# Patient Record
Sex: Female | Born: 1940 | Race: White | Hispanic: No | Smoking: Never smoker
Health system: Southern US, Community
[De-identification: ages and names within clinical notes are randomized; demographics above are authoritative.]

## PROBLEM LIST (undated history)

## (undated) DIAGNOSIS — M1990 Unspecified osteoarthritis, unspecified site: Secondary | ICD-10-CM

## (undated) DIAGNOSIS — R011 Cardiac murmur, unspecified: Secondary | ICD-10-CM

## (undated) DIAGNOSIS — Z9221 Personal history of antineoplastic chemotherapy: Secondary | ICD-10-CM

## (undated) DIAGNOSIS — M329 Systemic lupus erythematosus, unspecified: Secondary | ICD-10-CM

## (undated) DIAGNOSIS — IMO0002 Reserved for concepts with insufficient information to code with codable children: Secondary | ICD-10-CM

## (undated) DIAGNOSIS — T7840XA Allergy, unspecified, initial encounter: Secondary | ICD-10-CM

## (undated) DIAGNOSIS — C50919 Malignant neoplasm of unspecified site of unspecified female breast: Secondary | ICD-10-CM

## (undated) DIAGNOSIS — R251 Tremor, unspecified: Secondary | ICD-10-CM

## (undated) DIAGNOSIS — Z923 Personal history of irradiation: Secondary | ICD-10-CM

## (undated) HISTORY — DX: Systemic lupus erythematosus, unspecified: M32.9

## (undated) HISTORY — PX: BREAST BIOPSY: SHX20

## (undated) HISTORY — DX: Unspecified osteoarthritis, unspecified site: M19.90

## (undated) HISTORY — DX: Malignant neoplasm of unspecified site of unspecified female breast: C50.919

## (undated) HISTORY — PX: OTHER SURGICAL HISTORY: SHX169

## (undated) HISTORY — DX: Cardiac murmur, unspecified: R01.1

## (undated) HISTORY — DX: Tremor, unspecified: R25.1

## (undated) HISTORY — DX: Reserved for concepts with insufficient information to code with codable children: IMO0002

## (undated) HISTORY — DX: Allergy, unspecified, initial encounter: T78.40XA

---

## 2008-11-19 HISTORY — PX: FOOT SURGERY: SHX648

## 2013-11-19 HISTORY — PX: BREAST LUMPECTOMY: SHX2

## 2013-11-19 HISTORY — PX: REDUCTION MAMMAPLASTY: SUR839

## 2015-12-07 DIAGNOSIS — M79671 Pain in right foot: Secondary | ICD-10-CM | POA: Diagnosis not present

## 2015-12-07 DIAGNOSIS — B070 Plantar wart: Secondary | ICD-10-CM | POA: Diagnosis not present

## 2015-12-07 DIAGNOSIS — M1990 Unspecified osteoarthritis, unspecified site: Secondary | ICD-10-CM | POA: Diagnosis not present

## 2015-12-13 DIAGNOSIS — Z1239 Encounter for other screening for malignant neoplasm of breast: Secondary | ICD-10-CM | POA: Diagnosis not present

## 2015-12-13 DIAGNOSIS — C50911 Malignant neoplasm of unspecified site of right female breast: Secondary | ICD-10-CM | POA: Diagnosis not present

## 2015-12-22 DIAGNOSIS — L659 Nonscarring hair loss, unspecified: Secondary | ICD-10-CM | POA: Diagnosis not present

## 2015-12-22 DIAGNOSIS — R5383 Other fatigue: Secondary | ICD-10-CM | POA: Diagnosis not present

## 2015-12-22 DIAGNOSIS — Z853 Personal history of malignant neoplasm of breast: Secondary | ICD-10-CM | POA: Diagnosis not present

## 2015-12-28 DIAGNOSIS — R05 Cough: Secondary | ICD-10-CM | POA: Diagnosis not present

## 2015-12-28 DIAGNOSIS — R0600 Dyspnea, unspecified: Secondary | ICD-10-CM | POA: Diagnosis not present

## 2015-12-28 DIAGNOSIS — J45909 Unspecified asthma, uncomplicated: Secondary | ICD-10-CM | POA: Diagnosis not present

## 2015-12-30 DIAGNOSIS — R05 Cough: Secondary | ICD-10-CM | POA: Diagnosis not present

## 2016-01-04 DIAGNOSIS — M79671 Pain in right foot: Secondary | ICD-10-CM | POA: Diagnosis not present

## 2016-01-04 DIAGNOSIS — B070 Plantar wart: Secondary | ICD-10-CM | POA: Diagnosis not present

## 2016-01-06 DIAGNOSIS — R0609 Other forms of dyspnea: Secondary | ICD-10-CM | POA: Diagnosis not present

## 2016-01-06 DIAGNOSIS — J45909 Unspecified asthma, uncomplicated: Secondary | ICD-10-CM | POA: Diagnosis not present

## 2016-01-06 DIAGNOSIS — R938 Abnormal findings on diagnostic imaging of other specified body structures: Secondary | ICD-10-CM | POA: Diagnosis not present

## 2016-01-16 DIAGNOSIS — Z08 Encounter for follow-up examination after completed treatment for malignant neoplasm: Secondary | ICD-10-CM | POA: Diagnosis not present

## 2016-01-16 DIAGNOSIS — J9811 Atelectasis: Secondary | ICD-10-CM | POA: Diagnosis not present

## 2016-01-16 DIAGNOSIS — Z853 Personal history of malignant neoplasm of breast: Secondary | ICD-10-CM | POA: Diagnosis not present

## 2016-01-18 DIAGNOSIS — R918 Other nonspecific abnormal finding of lung field: Secondary | ICD-10-CM | POA: Diagnosis not present

## 2016-01-18 DIAGNOSIS — J209 Acute bronchitis, unspecified: Secondary | ICD-10-CM | POA: Diagnosis not present

## 2016-01-18 DIAGNOSIS — R231 Pallor: Secondary | ICD-10-CM | POA: Diagnosis not present

## 2016-02-01 DIAGNOSIS — M79671 Pain in right foot: Secondary | ICD-10-CM | POA: Diagnosis not present

## 2016-02-01 DIAGNOSIS — B070 Plantar wart: Secondary | ICD-10-CM | POA: Diagnosis not present

## 2016-02-01 DIAGNOSIS — M79672 Pain in left foot: Secondary | ICD-10-CM | POA: Diagnosis not present

## 2016-02-10 DIAGNOSIS — R002 Palpitations: Secondary | ICD-10-CM | POA: Diagnosis not present

## 2016-02-10 DIAGNOSIS — E669 Obesity, unspecified: Secondary | ICD-10-CM | POA: Diagnosis not present

## 2016-02-10 DIAGNOSIS — I10 Essential (primary) hypertension: Secondary | ICD-10-CM | POA: Diagnosis not present

## 2016-02-10 DIAGNOSIS — R0989 Other specified symptoms and signs involving the circulatory and respiratory systems: Secondary | ICD-10-CM | POA: Diagnosis not present

## 2016-02-11 DIAGNOSIS — I10 Essential (primary) hypertension: Secondary | ICD-10-CM | POA: Diagnosis not present

## 2016-02-11 DIAGNOSIS — R002 Palpitations: Secondary | ICD-10-CM | POA: Diagnosis not present

## 2016-03-01 DIAGNOSIS — M79672 Pain in left foot: Secondary | ICD-10-CM | POA: Diagnosis not present

## 2016-03-01 DIAGNOSIS — M79671 Pain in right foot: Secondary | ICD-10-CM | POA: Diagnosis not present

## 2016-03-01 DIAGNOSIS — B070 Plantar wart: Secondary | ICD-10-CM | POA: Diagnosis not present

## 2016-03-01 DIAGNOSIS — M2041 Other hammer toe(s) (acquired), right foot: Secondary | ICD-10-CM | POA: Diagnosis not present

## 2016-03-02 DIAGNOSIS — C50919 Malignant neoplasm of unspecified site of unspecified female breast: Secondary | ICD-10-CM | POA: Diagnosis not present

## 2016-03-26 DIAGNOSIS — R0989 Other specified symptoms and signs involving the circulatory and respiratory systems: Secondary | ICD-10-CM | POA: Diagnosis not present

## 2016-03-28 DIAGNOSIS — I10 Essential (primary) hypertension: Secondary | ICD-10-CM | POA: Diagnosis not present

## 2016-03-28 DIAGNOSIS — E785 Hyperlipidemia, unspecified: Secondary | ICD-10-CM | POA: Diagnosis not present

## 2016-03-28 DIAGNOSIS — R0989 Other specified symptoms and signs involving the circulatory and respiratory systems: Secondary | ICD-10-CM | POA: Diagnosis not present

## 2016-03-28 DIAGNOSIS — R002 Palpitations: Secondary | ICD-10-CM | POA: Diagnosis not present

## 2016-03-29 DIAGNOSIS — M79671 Pain in right foot: Secondary | ICD-10-CM | POA: Diagnosis not present

## 2016-03-29 DIAGNOSIS — B070 Plantar wart: Secondary | ICD-10-CM | POA: Diagnosis not present

## 2016-03-30 DIAGNOSIS — C50919 Malignant neoplasm of unspecified site of unspecified female breast: Secondary | ICD-10-CM | POA: Diagnosis not present

## 2016-04-28 DIAGNOSIS — I639 Cerebral infarction, unspecified: Secondary | ICD-10-CM | POA: Diagnosis not present

## 2016-04-28 DIAGNOSIS — R2990 Unspecified symptoms and signs involving the nervous system: Secondary | ICD-10-CM | POA: Diagnosis not present

## 2016-04-28 DIAGNOSIS — I700 Atherosclerosis of aorta: Secondary | ICD-10-CM | POA: Diagnosis not present

## 2016-04-28 DIAGNOSIS — G43109 Migraine with aura, not intractable, without status migrainosus: Secondary | ICD-10-CM | POA: Diagnosis not present

## 2016-04-28 DIAGNOSIS — Z79899 Other long term (current) drug therapy: Secondary | ICD-10-CM | POA: Diagnosis not present

## 2016-04-28 DIAGNOSIS — E785 Hyperlipidemia, unspecified: Secondary | ICD-10-CM | POA: Diagnosis not present

## 2016-04-28 DIAGNOSIS — I10 Essential (primary) hypertension: Secondary | ICD-10-CM | POA: Diagnosis not present

## 2016-04-28 DIAGNOSIS — R32 Unspecified urinary incontinence: Secondary | ICD-10-CM | POA: Diagnosis not present

## 2016-04-28 DIAGNOSIS — Z853 Personal history of malignant neoplasm of breast: Secondary | ICD-10-CM | POA: Diagnosis not present

## 2016-04-28 DIAGNOSIS — G459 Transient cerebral ischemic attack, unspecified: Secondary | ICD-10-CM | POA: Diagnosis not present

## 2016-04-28 DIAGNOSIS — R200 Anesthesia of skin: Secondary | ICD-10-CM | POA: Diagnosis not present

## 2016-04-28 DIAGNOSIS — R209 Unspecified disturbances of skin sensation: Secondary | ICD-10-CM | POA: Diagnosis not present

## 2016-04-29 DIAGNOSIS — G459 Transient cerebral ischemic attack, unspecified: Secondary | ICD-10-CM | POA: Diagnosis not present

## 2016-04-29 DIAGNOSIS — I639 Cerebral infarction, unspecified: Secondary | ICD-10-CM | POA: Diagnosis not present

## 2016-05-01 DIAGNOSIS — R42 Dizziness and giddiness: Secondary | ICD-10-CM | POA: Diagnosis not present

## 2016-05-01 DIAGNOSIS — Z853 Personal history of malignant neoplasm of breast: Secondary | ICD-10-CM | POA: Diagnosis not present

## 2016-05-01 DIAGNOSIS — I10 Essential (primary) hypertension: Secondary | ICD-10-CM | POA: Diagnosis not present

## 2016-05-01 DIAGNOSIS — R0602 Shortness of breath: Secondary | ICD-10-CM | POA: Diagnosis not present

## 2016-05-01 DIAGNOSIS — C50919 Malignant neoplasm of unspecified site of unspecified female breast: Secondary | ICD-10-CM | POA: Diagnosis not present

## 2016-05-01 DIAGNOSIS — R51 Headache: Secondary | ICD-10-CM | POA: Diagnosis not present

## 2016-05-01 DIAGNOSIS — R079 Chest pain, unspecified: Secondary | ICD-10-CM | POA: Diagnosis not present

## 2016-06-11 DIAGNOSIS — C50919 Malignant neoplasm of unspecified site of unspecified female breast: Secondary | ICD-10-CM | POA: Diagnosis not present

## 2016-08-14 DIAGNOSIS — M779 Enthesopathy, unspecified: Secondary | ICD-10-CM | POA: Diagnosis not present

## 2016-08-14 DIAGNOSIS — Z853 Personal history of malignant neoplasm of breast: Secondary | ICD-10-CM | POA: Diagnosis not present

## 2016-08-14 DIAGNOSIS — D179 Benign lipomatous neoplasm, unspecified: Secondary | ICD-10-CM | POA: Diagnosis not present

## 2016-09-14 DIAGNOSIS — J111 Influenza due to unidentified influenza virus with other respiratory manifestations: Secondary | ICD-10-CM | POA: Diagnosis not present

## 2016-09-26 DIAGNOSIS — S0993XA Unspecified injury of face, initial encounter: Secondary | ICD-10-CM | POA: Diagnosis not present

## 2016-09-26 DIAGNOSIS — S199XXA Unspecified injury of neck, initial encounter: Secondary | ICD-10-CM | POA: Diagnosis not present

## 2016-09-26 DIAGNOSIS — S0081XA Abrasion of other part of head, initial encounter: Secondary | ICD-10-CM | POA: Diagnosis not present

## 2016-09-26 DIAGNOSIS — S0990XA Unspecified injury of head, initial encounter: Secondary | ICD-10-CM | POA: Diagnosis not present

## 2016-09-26 DIAGNOSIS — S022XXA Fracture of nasal bones, initial encounter for closed fracture: Secondary | ICD-10-CM | POA: Diagnosis not present

## 2016-09-26 DIAGNOSIS — S098XXA Other specified injuries of head, initial encounter: Secondary | ICD-10-CM | POA: Diagnosis not present

## 2016-09-26 DIAGNOSIS — R040 Epistaxis: Secondary | ICD-10-CM | POA: Diagnosis not present

## 2016-09-26 DIAGNOSIS — S0090XA Unspecified superficial injury of unspecified part of head, initial encounter: Secondary | ICD-10-CM | POA: Diagnosis not present

## 2016-10-03 DIAGNOSIS — S20211A Contusion of right front wall of thorax, initial encounter: Secondary | ICD-10-CM | POA: Diagnosis not present

## 2016-10-24 DIAGNOSIS — R0781 Pleurodynia: Secondary | ICD-10-CM | POA: Diagnosis not present

## 2016-10-24 DIAGNOSIS — G8929 Other chronic pain: Secondary | ICD-10-CM | POA: Diagnosis not present

## 2016-10-24 DIAGNOSIS — N390 Urinary tract infection, site not specified: Secondary | ICD-10-CM | POA: Diagnosis not present

## 2016-10-24 DIAGNOSIS — R5383 Other fatigue: Secondary | ICD-10-CM | POA: Diagnosis not present

## 2016-10-24 DIAGNOSIS — R3981 Functional urinary incontinence: Secondary | ICD-10-CM | POA: Diagnosis not present

## 2016-10-24 DIAGNOSIS — Z609 Problem related to social environment, unspecified: Secondary | ICD-10-CM | POA: Diagnosis not present

## 2016-10-24 DIAGNOSIS — F321 Major depressive disorder, single episode, moderate: Secondary | ICD-10-CM | POA: Diagnosis not present

## 2016-10-24 DIAGNOSIS — M549 Dorsalgia, unspecified: Secondary | ICD-10-CM | POA: Diagnosis not present

## 2016-10-24 DIAGNOSIS — E669 Obesity, unspecified: Secondary | ICD-10-CM | POA: Diagnosis not present

## 2016-10-25 DIAGNOSIS — S022XXA Fracture of nasal bones, initial encounter for closed fracture: Secondary | ICD-10-CM | POA: Diagnosis not present

## 2016-10-25 DIAGNOSIS — J342 Deviated nasal septum: Secondary | ICD-10-CM | POA: Diagnosis not present

## 2016-10-25 DIAGNOSIS — H903 Sensorineural hearing loss, bilateral: Secondary | ICD-10-CM | POA: Diagnosis not present

## 2016-10-25 DIAGNOSIS — H608X3 Other otitis externa, bilateral: Secondary | ICD-10-CM | POA: Diagnosis not present

## 2016-10-30 DIAGNOSIS — N816 Rectocele: Secondary | ICD-10-CM | POA: Diagnosis not present

## 2016-10-30 DIAGNOSIS — R102 Pelvic and perineal pain: Secondary | ICD-10-CM | POA: Diagnosis not present

## 2016-10-30 DIAGNOSIS — N393 Stress incontinence (female) (male): Secondary | ICD-10-CM | POA: Diagnosis not present

## 2016-10-30 DIAGNOSIS — N8110 Cystocele, unspecified: Secondary | ICD-10-CM | POA: Diagnosis not present

## 2016-10-30 DIAGNOSIS — N942 Vaginismus: Secondary | ICD-10-CM | POA: Diagnosis not present

## 2016-10-30 DIAGNOSIS — R3982 Chronic bladder pain: Secondary | ICD-10-CM | POA: Diagnosis not present

## 2016-10-30 DIAGNOSIS — N952 Postmenopausal atrophic vaginitis: Secondary | ICD-10-CM | POA: Diagnosis not present

## 2016-11-01 DIAGNOSIS — Z0000 Encounter for general adult medical examination without abnormal findings: Secondary | ICD-10-CM | POA: Diagnosis not present

## 2016-11-01 DIAGNOSIS — M1990 Unspecified osteoarthritis, unspecified site: Secondary | ICD-10-CM | POA: Diagnosis not present

## 2016-11-01 DIAGNOSIS — R5383 Other fatigue: Secondary | ICD-10-CM | POA: Diagnosis not present

## 2016-11-01 DIAGNOSIS — M2550 Pain in unspecified joint: Secondary | ICD-10-CM | POA: Diagnosis not present

## 2016-11-01 DIAGNOSIS — Z79899 Other long term (current) drug therapy: Secondary | ICD-10-CM | POA: Diagnosis not present

## 2016-11-02 DIAGNOSIS — R350 Frequency of micturition: Secondary | ICD-10-CM | POA: Diagnosis not present

## 2016-11-02 DIAGNOSIS — K219 Gastro-esophageal reflux disease without esophagitis: Secondary | ICD-10-CM | POA: Diagnosis not present

## 2016-11-02 DIAGNOSIS — N3941 Urge incontinence: Secondary | ICD-10-CM | POA: Diagnosis not present

## 2016-11-02 DIAGNOSIS — N898 Other specified noninflammatory disorders of vagina: Secondary | ICD-10-CM | POA: Diagnosis not present

## 2016-11-05 DIAGNOSIS — R350 Frequency of micturition: Secondary | ICD-10-CM | POA: Diagnosis not present

## 2016-11-05 DIAGNOSIS — R102 Pelvic and perineal pain: Secondary | ICD-10-CM | POA: Diagnosis not present

## 2016-11-06 DIAGNOSIS — R3981 Functional urinary incontinence: Secondary | ICD-10-CM | POA: Diagnosis not present

## 2016-11-06 DIAGNOSIS — R350 Frequency of micturition: Secondary | ICD-10-CM | POA: Diagnosis not present

## 2016-11-06 DIAGNOSIS — N898 Other specified noninflammatory disorders of vagina: Secondary | ICD-10-CM | POA: Diagnosis not present

## 2016-11-06 DIAGNOSIS — E781 Pure hyperglyceridemia: Secondary | ICD-10-CM | POA: Diagnosis not present

## 2016-11-06 DIAGNOSIS — Z853 Personal history of malignant neoplasm of breast: Secondary | ICD-10-CM | POA: Diagnosis not present

## 2016-11-06 DIAGNOSIS — F321 Major depressive disorder, single episode, moderate: Secondary | ICD-10-CM | POA: Diagnosis not present

## 2016-11-08 DIAGNOSIS — N952 Postmenopausal atrophic vaginitis: Secondary | ICD-10-CM | POA: Diagnosis not present

## 2016-11-08 DIAGNOSIS — N8110 Cystocele, unspecified: Secondary | ICD-10-CM | POA: Diagnosis not present

## 2016-11-08 DIAGNOSIS — N393 Stress incontinence (female) (male): Secondary | ICD-10-CM | POA: Diagnosis not present

## 2016-11-08 DIAGNOSIS — R102 Pelvic and perineal pain: Secondary | ICD-10-CM | POA: Diagnosis not present

## 2016-11-08 DIAGNOSIS — N942 Vaginismus: Secondary | ICD-10-CM | POA: Diagnosis not present

## 2016-11-08 DIAGNOSIS — R3982 Chronic bladder pain: Secondary | ICD-10-CM | POA: Diagnosis not present

## 2016-11-08 DIAGNOSIS — N816 Rectocele: Secondary | ICD-10-CM | POA: Diagnosis not present

## 2016-11-14 DIAGNOSIS — N393 Stress incontinence (female) (male): Secondary | ICD-10-CM | POA: Diagnosis not present

## 2016-11-14 DIAGNOSIS — R3915 Urgency of urination: Secondary | ICD-10-CM | POA: Diagnosis not present

## 2016-11-14 DIAGNOSIS — R351 Nocturia: Secondary | ICD-10-CM | POA: Diagnosis not present

## 2016-11-14 DIAGNOSIS — R350 Frequency of micturition: Secondary | ICD-10-CM | POA: Diagnosis not present

## 2016-11-16 DIAGNOSIS — Z01419 Encounter for gynecological examination (general) (routine) without abnormal findings: Secondary | ICD-10-CM | POA: Diagnosis not present

## 2016-11-30 DIAGNOSIS — C50919 Malignant neoplasm of unspecified site of unspecified female breast: Secondary | ICD-10-CM | POA: Diagnosis not present

## 2016-11-30 DIAGNOSIS — E559 Vitamin D deficiency, unspecified: Secondary | ICD-10-CM | POA: Diagnosis not present

## 2016-12-05 DIAGNOSIS — E871 Hypo-osmolality and hyponatremia: Secondary | ICD-10-CM | POA: Diagnosis not present

## 2016-12-05 DIAGNOSIS — R945 Abnormal results of liver function studies: Secondary | ICD-10-CM | POA: Diagnosis not present

## 2016-12-11 DIAGNOSIS — M3500 Sicca syndrome, unspecified: Secondary | ICD-10-CM | POA: Diagnosis not present

## 2016-12-11 DIAGNOSIS — L659 Nonscarring hair loss, unspecified: Secondary | ICD-10-CM | POA: Diagnosis not present

## 2016-12-14 DIAGNOSIS — R928 Other abnormal and inconclusive findings on diagnostic imaging of breast: Secondary | ICD-10-CM | POA: Diagnosis not present

## 2016-12-17 DIAGNOSIS — R0981 Nasal congestion: Secondary | ICD-10-CM | POA: Diagnosis not present

## 2016-12-17 DIAGNOSIS — J310 Chronic rhinitis: Secondary | ICD-10-CM | POA: Diagnosis not present

## 2016-12-17 DIAGNOSIS — J342 Deviated nasal septum: Secondary | ICD-10-CM | POA: Diagnosis not present

## 2017-01-24 DIAGNOSIS — L603 Nail dystrophy: Secondary | ICD-10-CM | POA: Diagnosis not present

## 2017-01-24 DIAGNOSIS — L84 Corns and callosities: Secondary | ICD-10-CM | POA: Diagnosis not present

## 2017-01-24 DIAGNOSIS — M25571 Pain in right ankle and joints of right foot: Secondary | ICD-10-CM | POA: Diagnosis not present

## 2017-01-24 DIAGNOSIS — I739 Peripheral vascular disease, unspecified: Secondary | ICD-10-CM | POA: Diagnosis not present

## 2017-03-28 ENCOUNTER — Encounter: Payer: Self-pay | Admitting: Family Medicine

## 2017-03-28 ENCOUNTER — Other Ambulatory Visit: Payer: Self-pay | Admitting: Family Medicine

## 2017-03-28 ENCOUNTER — Ambulatory Visit (INDEPENDENT_AMBULATORY_CARE_PROVIDER_SITE_OTHER): Payer: Medicare Other | Admitting: Family Medicine

## 2017-03-28 VITALS — BP 140/82 | HR 79 | Temp 98.4°F | Resp 16 | Ht 66.0 in | Wt 206.0 lb

## 2017-03-28 DIAGNOSIS — R32 Unspecified urinary incontinence: Secondary | ICD-10-CM | POA: Diagnosis not present

## 2017-03-28 DIAGNOSIS — R5383 Other fatigue: Secondary | ICD-10-CM | POA: Diagnosis not present

## 2017-03-28 DIAGNOSIS — C50919 Malignant neoplasm of unspecified site of unspecified female breast: Secondary | ICD-10-CM

## 2017-03-28 DIAGNOSIS — C801 Malignant (primary) neoplasm, unspecified: Secondary | ICD-10-CM | POA: Diagnosis not present

## 2017-03-28 DIAGNOSIS — M329 Systemic lupus erythematosus, unspecified: Secondary | ICD-10-CM

## 2017-03-28 DIAGNOSIS — C50911 Malignant neoplasm of unspecified site of right female breast: Secondary | ICD-10-CM | POA: Insufficient documentation

## 2017-03-28 LAB — POCT URINALYSIS DIP (DEVICE)
Bilirubin Urine: NEGATIVE
GLUCOSE, UA: NEGATIVE mg/dL
Hgb urine dipstick: NEGATIVE
Ketones, ur: NEGATIVE mg/dL
Leukocytes, UA: NEGATIVE
Nitrite: NEGATIVE
PH: 5.5 (ref 5.0–8.0)
PROTEIN: NEGATIVE mg/dL
SPECIFIC GRAVITY, URINE: 1.015 (ref 1.005–1.030)
UROBILINOGEN UA: 0.2 mg/dL (ref 0.0–1.0)

## 2017-03-28 NOTE — Progress Notes (Addendum)
Patient ID: Sarah Mata, female    DOB: 1941-11-15, 76 y.o.   MRN: [id number]  PCP: Scot Jun, FNP  Chief Complaint  Patient presents with  . Establish Care  . Fatigue    Subjective:  HPI Sarah Mata is a 76 y.o. female presents to establish care today. Sarah Mata recently relocated to [facility] from [place].  Medical problems include hx of right breast cancer, lumpectomy (2015), Systemic Lupus Erythematosus,  Obesity (33.3 BMI). Sarah Mata reports that she doesn't care for taking prescription medications.  She takes vitamins supplements which are all listed on her medication list.   Breast Cancer Previously followed by [facility] in [place], [place] for oncology treatments. She is currently prescribed Letrozole 2.5 mg. Reports that she was told that the cancer was in remission and treatment now consists of medication and routine oncology surveillance.  Systemic Lupus Erythematosus  Diagnosed several years ago and at some point was followed by Rheumatology.  She didn't care for her specialist and stopped taking her prescribed Plaquenil. She thinks it has been at least  3 or more years since she stopped taking plaquenil. She feels for the most part her Lupus has remained under control. No prior hospitalization related to SLE. Denies any past problems with kidneys and or kidney functions. Reports intermittent although on rare occasions  She experiences joint pain and "flu-like" feeling that causes her to want to ly around in the bed as her energy is drained.   Bladder Leakage  Prior to leaving [place], Sarah Mata was being evaluated for pelvic floor weakness by gynecology. GYN wanted to start her on medication to try and relax her bladder although patient declined. Problem is worsening and she reports increase urinary frequency and occasional incontinence.   Fatigue  Thinks she is very overweight which may be contributing to her fatigue.  Somewhat fearful that SLE is  worsening as she hasn't taken her medication in so long. Quick to tire after walking around the store and or completing minimal tasks. Presently is she not engaged in any physical exercise.   Social History   Social History  . Marital status: Widowed    Spouse name: N/A  . Number of children: N/A  . Years of education: N/A   Occupational History  . Not on file.   Social History Main Topics  . Smoking status: Never Smoker  . Smokeless tobacco: Never Used  . Alcohol use No  . Drug use: No  . Sexual activity: Not on file   Other Topics Concern  . Not on file   Social History Narrative  . No narrative on file   Review of Systems See HPI   Prior to Admission medications   Medication Sig Start Date End Date Taking? Authorizing Provider  Biotin 10000 MCG TABS Take by mouth.   Yes [provider]  Cholecalciferol (VITAMIN D3) 3000 units TABS Take by mouth.   Yes [provider]  Coenzyme Q10 (CO Q-10) 100 MG CAPS Take by mouth.   Yes [provider]  letrozole (FEMARA) 2.5 MG tablet Take 2.5 mg by mouth daily.   Yes [provider]  vitamin E 400 UNIT capsule Take 400 Units by mouth daily.   Yes [provider]   Past Medical, Surgical Family and Social History reviewed and updated.  Objective:   Today's Vitals   03/28/17 1408  BP: 140/82  Pulse: 79  Resp: 16  Temp: 98.4 F (36.9 C)  TempSrc: Oral  SpO2:  95%  Weight: 206 lb (93.4 kg)  Height: 5\' 6"  (1.676 m)    Wt Readings from Last 3 Encounters:  03/28/17 206 lb (93.4 kg)   Physical Exam  Constitutional: She is oriented to person, place, and time. She appears well-developed and well-nourished.  HENT:  Head: Normocephalic and atraumatic.  Right Ear: External ear normal.  Left Ear: External ear normal.  Nose: Nose normal.  Mouth/Throat: Oropharynx is clear and moist.  Eyes: Conjunctivae and EOM are normal. Pupils are equal, round, and reactive to light.  Neck:  Normal range of motion. Neck supple. No thyromegaly present.  Cardiovascular: Normal rate, regular rhythm, normal heart sounds and intact distal pulses.   Pulmonary/Chest: Effort normal and breath sounds normal.  Abdominal: Soft. Bowel sounds are normal.  Musculoskeletal: Normal range of motion.  Neurological: She is alert and oriented to person, place, and time.  Skin: Skin is warm and dry.  Psychiatric: She has a normal mood and affect. Her behavior is normal. Judgment and thought content normal.      Assessment & Plan:  1. Malignant neoplasm of female breast, unspecified estrogen receptor status, unspecified laterality, unspecified site of breast ([place]) - COMPLETE METABOLIC PANEL WITH GFR - CBC with Differential - Ambulatory referral to Oncology -Medical release form received from patient and will initiate obtaining medical records from [facility].  2. Systemic lupus erythematosus, unspecified SLE type, unspecified organ involvement status (HCC) - ANA, IFA Comprehensive Panel - Sedimentation Rate - COMPLETE METABOLIC PANEL WITH GFR - C-reactive protein - Ambulatory referral to Rheumatology  3. Fatigue, unspecified type - Thyroid Panel With TSH  4. Bladder leak - Ambulatory referral to Urology  RTC: 6 weeks evaluation of fatigue and review labs results    Carroll Sage. Kenton Kingfisher, MSN, [facility] [address] [place], [place] [address] [phone]

## 2017-03-28 NOTE — Patient Instructions (Addendum)
I have referred you to Oncology for routine oncology follow-up due to history of breast cancer.  I will update you, regarding your lab results and decide whether or not you require a referral to rheumatology at this point.  Return for follow-up in 6 weeks to re-evaluate your fatigue. You may take Fish Oil 1,000 mg for cardiovascular health promotion.  I am also referring you to urology for evaluation and treatment of pelvic floor weakness and incontinence.   Referrals will contact you to schedule appointments   Fatigue Fatigue is feeling tired all of the time, a lack of energy, or a lack of motivation. Occasional or mild fatigue is often a normal response to activity or life in general. However, long-lasting (chronic) or extreme fatigue may indicate an underlying medical condition. Follow these instructions at home: Watch your fatigue for any changes. The following actions may help to lessen any discomfort you are feeling:  Talk to your health care provider about how much sleep you need each night. Try to get the required amount every night.  Take medicines only as directed by your health care provider.  Eat a healthy and nutritious diet. Ask your health care provider if you need help changing your diet.  Drink enough fluid to keep your urine clear or pale yellow.  Practice ways of relaxing, such as yoga, meditation, massage therapy, or acupuncture.  Exercise regularly.  Change situations that cause you stress. Try to keep your work and personal routine reasonable.  Do not abuse illegal drugs.  Limit alcohol intake to no more than 1 drink per day for nonpregnant women and 2 drinks per day for men. One drink equals 12 ounces of beer, 5 ounces of wine, or 1 ounces of hard liquor.  Take a multivitamin, if directed by your health care provider. Contact a health care provider if:  Your fatigue does not get better.  You have a fever.  You have unintentional weight loss or  gain.  You have headaches.  You have difficulty:  Falling asleep.  Sleeping throughout the night.  You feel angry, guilty, anxious, or sad.  You are unable to have a bowel movement (constipation).  You skin is dry.  Your legs or another part of your body is swollen. Get help right away if:  You feel confused.  Your vision is blurry.  You feel faint or pass out.  You have a severe headache.  You have severe abdominal, pelvic, or back pain.  You have chest pain, shortness of breath, or an irregular or fast heartbeat.  You are unable to urinate or you urinate less than normal.  You develop abnormal bleeding, such as bleeding from the rectum, vagina, nose, lungs, or nipples.  You vomit blood.  You have thoughts about harming yourself or committing suicide.  You are worried that you might harm someone else. This information is not intended to replace advice given to you by your health care provider. Make sure you discuss any questions you have with your health care provider. Document Released: 09/02/2007 Document Revised: 04/12/2016 Document Reviewed: 03/09/2014 Elsevier Interactive Patient Education  2017 [employer].

## 2017-03-29 ENCOUNTER — Telehealth: Payer: Self-pay

## 2017-03-29 LAB — ANA, IFA COMPREHENSIVE PANEL
Anti Nuclear Antibody(ANA): POSITIVE — AB
ENA SM AB SER-ACNC: NEGATIVE
SCLERODERMA (SCL-70) (ENA) ANTIBODY, IGG: NEGATIVE
SM/RNP: POSITIVE — AB
SSA (Ro) (ENA) Antibody, IgG: 4.9 — ABNORMAL HIGH
SSB (La) (ENA) Antibody, IgG: <1
ds DNA Ab: 1 IU/mL

## 2017-03-29 LAB — COMPLETE METABOLIC PANEL WITH GFR
ALT: 12 U/L (ref 6–29)
AST: 21 U/L (ref 10–35)
Albumin: 4.2 g/dL (ref 3.6–5.1)
Alkaline Phosphatase: 112 U/L (ref 33–130)
BUN: 16 mg/dL (ref 7–25)
CALCIUM: 9.4 mg/dL (ref 8.6–10.4)
CHLORIDE: 103 mmol/L (ref 98–110)
CO2: 17 mmol/L — ABNORMAL LOW (ref 20–31)
CREATININE: 0.83 mg/dL (ref 0.60–0.93)
GFR, Est African American: 81 mL/min (ref >=60)
GFR, Est Non African American: 70 mL/min (ref >=60)
GLUCOSE: 78 mg/dL (ref 65–99)
POTASSIUM: 4.2 mmol/L (ref 3.5–5.3)
SODIUM: 137 mmol/L (ref 135–146)
Total Bilirubin: 0.4 mg/dL (ref 0.2–1.2)
Total Protein: 7.1 g/dL (ref 6.1–8.1)

## 2017-03-29 LAB — THYROID PANEL WITH TSH
Free Thyroxine Index: 2.4 (ref 1.4–3.8)
T3 Uptake: 28 % (ref 22–35)
T4, Total: 8.6 ug/dL (ref 4.5–12.0)
TSH: 2.01 m[IU]/L

## 2017-03-29 LAB — CBC WITH DIFFERENTIAL/PLATELET
BASOS PCT: 1 %
Basophils Absolute: 73 cells/uL (ref 0–200)
EOS ABS: 146 {cells}/uL (ref 15–500)
Eosinophils Relative: 2 %
HCT: 42.8 % (ref 35.0–45.0)
HEMOGLOBIN: 14.2 g/dL (ref 11.7–15.5)
LYMPHS ABS: 1752 {cells}/uL (ref 850–3900)
Lymphocytes Relative: 24 %
MCH: 30 pg (ref 27.0–33.0)
MCHC: 33.2 g/dL (ref 32.0–36.0)
MCV: 90.3 fL (ref 80.0–100.0)
MONO ABS: 876 {cells}/uL (ref 200–950)
MPV: 11.1 fL (ref 7.5–12.5)
Monocytes Relative: 12 %
NEUTROS PCT: 61 %
Neutro Abs: 4453 cells/uL (ref 1500–7800)
Platelets: 189 10*3/uL (ref 140–400)
RBC: 4.74 MIL/uL (ref 3.80–5.10)
RDW: 14.6 % (ref 11.0–15.0)
WBC: 7.3 10*3/uL (ref 3.8–10.8)

## 2017-03-29 LAB — C-REACTIVE PROTEIN: CRP: 5.1 mg/L (ref <8.0)

## 2017-03-29 LAB — HEMOGLOBIN A1C

## 2017-03-29 LAB — ANTI-NUCLEAR AB-TITER (ANA TITER)

## 2017-03-29 LAB — SEDIMENTATION RATE

## 2017-03-29 NOTE — Telephone Encounter (Signed)
Per Lelon Frohlich at [facility] A1C was added on.

## 2017-03-29 NOTE — Telephone Encounter (Signed)
-----   Message from Scot Jun, [place] sent at 03/29/2017  9:05 AM EDT ----- Regarding: Add Lab Please add hemoglobin A1C

## 2017-04-03 ENCOUNTER — Telehealth: Payer: Self-pay | Admitting: Family Medicine

## 2017-04-03 NOTE — Telephone Encounter (Signed)
erroneous

## 2017-04-03 NOTE — Telephone Encounter (Signed)
Sarah Mata,  Please call, Dr. Prentice Docker office to request medical records for [facility]  [phone]. I will provide you with the signed medical release form to fax over. I need all records pertaining to her diagnosis and treatment of  Malignant breast cancer. This information is needed to establish care with [facility].  Carroll Sage. Kenton Kingfisher, MSN, FNP-C [facility]  [address] [place], [place] [address] [phone]

## 2017-04-04 ENCOUNTER — Telehealth: Payer: Self-pay | Admitting: Family Medicine

## 2017-04-04 NOTE — Progress Notes (Signed)
Hemoglobin pending for next visit. Prior visit there was not sufficient blood to run test.

## 2017-04-04 NOTE — Telephone Encounter (Signed)
Called patient to advise of lab results and pending referral to Rheumatology and Oncology. She provided me with a different contact number for prior oncologist in [place]. Reports she has no prior contact information for Rheumatologist as it has been greater than 8 years since she followed a Rheumatologist.  Patient advised me that she recently stopped taking her letrozole as she feels it is poison that she is putting in her body. I explained that this medication is indicated In the treatment of her diagnosis of breast cancer and I strongly recommend continuing regimen until she is evaluated by oncology.  She verbalized understanding however she reiterated that she is discontinuing medication.  Carroll Sage. Kenton Kingfisher, MSN, FNP-C [facility]  [address] [place] [place], [place] [address] [phone]

## 2017-04-07 ENCOUNTER — Telehealth: Payer: Self-pay | Admitting: Family Medicine

## 2017-04-07 NOTE — Telephone Encounter (Signed)
Andria Frames please contact Dr. Burnis Kingfisher office at [phone] in [place] to obtain fax number to request patient's medical records.

## 2017-04-09 ENCOUNTER — Telehealth: Payer: Self-pay

## 2017-04-09 NOTE — Telephone Encounter (Signed)
Advise patient that I can't refill her Femara as this is a medication use to treat her  Breast cancer. Her referral to oncology is pending receipt of medical records.

## 2017-04-12 ENCOUNTER — Telehealth: Payer: Self-pay | Admitting: Family Medicine

## 2017-04-12 NOTE — Telephone Encounter (Signed)
Pt requests completion of form for handicap placard; pt informed to call back on Tuesday to check for completion

## 2017-04-16 NOTE — Telephone Encounter (Signed)
Left vm for patient to call back.

## 2017-04-16 NOTE — Telephone Encounter (Signed)
Patient notified that form is ready for pick up.

## 2017-04-18 ENCOUNTER — Encounter: Payer: Self-pay | Admitting: Hematology

## 2017-04-18 ENCOUNTER — Telehealth: Payer: Self-pay | Admitting: Hematology

## 2017-04-18 NOTE — Telephone Encounter (Signed)
Appt has been scheduled for the pt to see Dr. Burr Medico on 7/3 at 230pm. Pt aware to arrive 30 minutes early. Demographics verified. Letter mailed.

## 2017-05-07 ENCOUNTER — Ambulatory Visit: Payer: 59 | Admitting: Family Medicine

## 2017-05-09 ENCOUNTER — Ambulatory Visit: Payer: 59 | Admitting: Family Medicine

## 2017-05-21 ENCOUNTER — Telehealth: Payer: Self-pay | Admitting: Hematology

## 2017-05-21 ENCOUNTER — Encounter: Payer: Self-pay | Admitting: Hematology

## 2017-05-21 ENCOUNTER — Ambulatory Visit (HOSPITAL_BASED_OUTPATIENT_CLINIC_OR_DEPARTMENT_OTHER): Payer: Medicare Other | Admitting: Hematology

## 2017-05-21 ENCOUNTER — Ambulatory Visit (HOSPITAL_BASED_OUTPATIENT_CLINIC_OR_DEPARTMENT_OTHER): Payer: Medicare Other

## 2017-05-21 VITALS — BP 150/78 | HR 71 | Temp 98.5°F | Resp 20 | Ht 66.0 in | Wt 205.2 lb

## 2017-05-21 DIAGNOSIS — E2839 Other primary ovarian failure: Secondary | ICD-10-CM

## 2017-05-21 DIAGNOSIS — Z170 Estrogen receptor positive status [ER+]: Secondary | ICD-10-CM

## 2017-05-21 DIAGNOSIS — Z79811 Long term (current) use of aromatase inhibitors: Secondary | ICD-10-CM | POA: Diagnosis not present

## 2017-05-21 DIAGNOSIS — E559 Vitamin D deficiency, unspecified: Secondary | ICD-10-CM

## 2017-05-21 DIAGNOSIS — C50911 Malignant neoplasm of unspecified site of right female breast: Secondary | ICD-10-CM | POA: Diagnosis not present

## 2017-05-21 LAB — COMPREHENSIVE METABOLIC PANEL
ALK PHOS: 149 U/L (ref 40–150)
ALT: 338 U/L (ref 0–55)
AST: 159 U/L — ABNORMAL HIGH (ref 5–34)
Albumin: 3.8 g/dL (ref 3.5–5.0)
Anion Gap: 10 mEq/L (ref 3–11)
BILIRUBIN TOTAL: 0.57 mg/dL (ref 0.20–1.20)
BUN: 16.9 mg/dL (ref 7.0–26.0)
CO2: 29 meq/L (ref 22–29)
Calcium: 10.1 mg/dL (ref 8.4–10.4)
Chloride: 100 mEq/L (ref 98–109)
Creatinine: 0.9 mg/dL (ref 0.6–1.1)
EGFR: 63 mL/min/{1.73_m2} — AB (ref >90)
GLUCOSE: 77 mg/dL (ref 70–140)
POTASSIUM: 4.5 meq/L (ref 3.5–5.1)
SODIUM: 138 meq/L (ref 136–145)
TOTAL PROTEIN: 7.6 g/dL (ref 6.4–8.3)

## 2017-05-21 LAB — CBC WITH DIFFERENTIAL/PLATELET
BASO%: 1.1 % (ref 0.0–2.0)
BASOS ABS: 0.1 10*3/uL (ref 0.0–0.1)
EOS ABS: 0.1 10*3/uL (ref 0.0–0.5)
EOS%: 2.2 % (ref 0.0–7.0)
HCT: 42.2 % (ref 34.8–46.6)
HGB: 13.9 g/dL (ref 11.6–15.9)
LYMPH%: 24 % (ref 14.0–49.7)
MCH: 29.2 pg (ref 25.1–34.0)
MCHC: 33 g/dL (ref 31.5–36.0)
MCV: 88.4 fL (ref 79.5–101.0)
MONO#: 0.8 10*3/uL (ref 0.1–0.9)
MONO%: 14.1 % — AB (ref 0.0–14.0)
NEUT#: 3.1 10*3/uL (ref 1.5–6.5)
NEUT%: 58.6 % (ref 38.4–76.8)
PLATELETS: 182 10*3/uL (ref 145–400)
RBC: 4.77 10*6/uL (ref 3.70–5.45)
RDW: 14.2 % (ref 11.2–14.5)
WBC: 5.4 10*3/uL (ref 3.9–10.3)
lymph#: 1.3 10*3/uL (ref 0.9–3.3)

## 2017-05-21 MED ORDER — ANASTROZOLE 1 MG PO TABS: 1.0000 mg | ORAL_TABLET | Freq: Every day | ORAL | 0 refills | Status: DC

## 2017-05-21 NOTE — Telephone Encounter (Signed)
Scheduled appt per 7/23 los - Gave patient AVS and calender per los.  

## 2017-05-21 NOTE — Progress Notes (Addendum)
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 Grade 3, Ki-67 20%.      03/2014 Miscellaneous    mammaprint reviewed high-risk luminal type      04/2014 -  Radiation Therapy    Patient underwent radiation to the right chest wall      08/2014 -  Anti-estrogen oral therapy    Started Femara 2.'5mg'$         HISTORY OF PRESENTING ILLNESS:  Sarah Mata is a  76 y.o. female who has recently relocated from [place] to [place]. She has a known diagnosis of right-sided breast cancer. She presented with a palpable abnormality in the right breast which led to additional workup in March 2015. A core biopsy in April of 2015 showed an invasive ductal carcinoma grade 2, ER+/PR-/HER-2 negaive. She later underwent a right lumpectomy with axillary lymph node sampling. The tumor characteristics notes a 2.5 x 2 x 2 cm tumor with evidence of lymphovascular invasion. The tumor was high-rade at grade 3 with Kli 67 index at 20%. She underwent 4 cycles of cytoxan and Taxotere with growth factor support with Neupogen which she finished in October of 2015. Post treatment, she underwent radiation therapy to the right chest wall and also was  started on antiestrogen therapy with Letrozole 2.5 mg/day that she is complaint with.  She also suffers from history of anxiety.    Today, she presents to the clinic after moving to [place]. Besides hair loss and fatigue, she has no other problems with Letrozole. She has Lupus which she says might also cause her fatigue. Before moving to [place], she fell and broke her nose. She denies any other chronic conditions.  MEDICAL HISTORY:  Past Medical History:  Diagnosis Date  . Breast cancer ([place])   . Lupus     SURGICAL HISTORY: Past Surgical History:  Procedure Laterality Date  . BREAST LUMPECTOMY Right 2015  . FOOT SURGERY Right 2010    SOCIAL HISTORY: Social History   Social History  . Marital status: Widowed    Spouse name: N/A  . Number of children: N/A  . Years of education: N/A   Occupational History  . Not on file.   Social History Main Topics  . Smoking status: Never Smoker  . Smokeless tobacco: Never Used  . Alcohol use No  . Drug use: No  . Sexual activity: Not on file   Other Topics Concern  . Not on file   Social History Narrative  . No narrative on file    FAMILY HISTORY: Family History  Problem Relation Age of Onset  . Cancer Mother  breast cancer   . Cancer Sister 74       breast cancer     ALLERGIES:  is allergic to tetracyclines & related.  MEDICATIONS:  Current Outpatient Prescriptions  Medication Sig Dispense Refill  . Biotin 10000 MCG TABS Take by mouth.    . Cholecalciferol (VITAMIN D3) 3000 units TABS Take by mouth.    . Coenzyme Q10 (CO Q-10) 100 MG CAPS Take by mouth.    . letrozole (FEMARA) 2.5 MG tablet Take 2.5 mg by mouth daily.    Marland Kitchen anastrozole (ARIMIDEX) 1 MG tablet Take 1 tablet (1 mg total) by mouth daily. 30 tablet 0  . azelastine (ASTELIN) 0.1 % nasal spray     . fluticasone (FLONASE) 50 MCG/ACT nasal spray     . vitamin E 400 UNIT capsule Take 400 Units by mouth daily.     No current facility-administered medications for  this visit.     REVIEW OF SYSTEMS:   Constitutional: Denies fevers, chills or abnormal night sweats Eyes: Denies blurriness of vision, double vision or watery eyes Ears, nose, mouth, throat, and face: Denies mucositis or sore throat Respiratory: Denies cough, dyspnea or wheezes Cardiovascular: Denies palpitation, chest discomfort or lower extremity swelling Gastrointestinal:  Denies nausea, heartburn or change in bowel habits Skin: Denies abnormal skin rashes Lymphatics: Denies new lymphadenopathy or easy bruising Neurological:Denies numbness, tingling or new weaknesses (+)shaking of right hand Behavioral/Psych: Mood is stable, no new changes  All other systems were reviewed with the patient and are negative.  PHYSICAL EXAMINATION:  ECOG PERFORMANCE STATUS: 0 - Asymptomatic  Vitals:   05/21/17 1449  BP: (!) 150/78  Pulse: 71  Resp: 20  Temp: 98.5 F (36.9 C)   Filed Weights   05/21/17 1449  Weight: 205 lb 3.2 oz (93.1 kg)    GENERAL:alert, no distress and comfortable SKIN: skin color, texture, turgor are normal, no rashes or significant lesions EYES: normal, conjunctiva are pink and non-injected, sclera clear OROPHARYNX:no exudate, no erythema and lips, buccal mucosa, and tongue normal  NECK: supple, thyroid normal size, non-tender, without nodularity LYMPH:  no palpable lymphadenopathy in the cervical, axillary or inguinal LUNGS: clear to auscultation and percussion with normal breathing effort HEART: regular rate & rhythm and no murmurs and no lower extremity edema ABDOMEN:abdomen soft, non-tender and normal bowel sounds Musculoskeletal:no cyanosis of digits and no clubbing  PSYCH: alert & oriented x 3 with fluent speech NEURO: no focal motor/sensory deficits Breasts: Breast inspection showed them to be symmetrical with no nipple discharge, s/p . Surgical scar in the right breast and axilla has healed well. Palpation of the breasts and axilla revealed no obvious mass  that I could appreciate, except surgical scar and a 1cm mass above the surgical scar of right breast, at 7:00 position    LABORATORY DATA:  I have reviewed the data as listed CBC Latest Ref Rng & Units 03/28/2017  WBC 3.8 - 10.8 K/uL 7.3  Hemoglobin 11.7 - 15.5 g/dL 14.2  Hematocrit 35.0 - 45.0 % 42.8  Platelets 140 - 400 K/uL 189   CMP Latest Ref Rng & Units 05/21/2017 03/28/2017  Glucose 70 - 140 mg/dl 77 78  BUN 7.0 - 26.0 mg/dL 16.9 16  Creatinine 0.6 - 1.1 mg/dL 0.9 0.83  Sodium 136 - 145 mEq/L 138 137  Potassium 3.5 - 5.1 mEq/L 4.5 4.2  Chloride 98 - 110 mmol/L - 103  CO2 22 - 29 mEq/L 29 17(L)  Calcium 8.4 - 10.4 mg/dL 10.1  9.4  Total Protein 6.4 - 8.3 g/dL 7.6 7.1  Total Bilirubin 0.20 - 1.20 mg/dL 0.57 0.4  Alkaline Phos 40 - 150 U/L 149 112  AST 5 - 34 U/L 159(H) 21  ALT 0 - 55 U/L 338(HH) 12     RADIOGRAPHIC STUDIES: I have personally reviewed the radiological images as listed and agreed with the findings in the report. No results found.  ASSESSMENT & PLAN:   1. 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  -She is status post lumpectomy, adjuvant chemotherapy and irradiation, currently on adjuvant letrozole, tolerating well overall. -We discussed the risk of cancer recurrence. She has high risk for recurrence based on the mammaprint result  - last mammogram was Nov 30, 2016, I will try to get a copy  -She is clinically doing well, exam was unremarkable, no clinical concern for recurrence. -I recommend her to continue adjuvant antiestrogen therapy. She is currently taking 0.'2mg'$  Letrozole daily. Due to shaking of right hand, she is very concerned this could be side effect of the letrozole. I recommend her to switch to Anastrozole '1mg'$  to take once daily. If she does not tolerate well, she can switch back to Letrozole. We also discussed the option of  tamoxifen and exemestane. Due to her high-risk disease,I recommend her to continue for 7 years if she tolerates well. - f/u in 6 months -We'll repeat lab today. -Continue breast cancer surveillance, with self-exam, routine follow-up with lab and exam, and annual mammogram. We discussed that surveillance scan is not indicated, unless there is clinical concern for recurrence or metastasis. - I strongly encouraged her to eat healthier and exercise more - bone density scan within this year  2. Bone health -I discussed the potential osteopenia and osteoporosis from aromatase inhibitor -She is on calcium and vitamin D -She is due for bone density scan, I'll schedule for her  3. Lupus -She has appointment with oncologist in the near future  PLAN - bone density scan within this year - get last mammogram results - switch letrozole to Anastrozole 1 mg to take once daily -Lab today -Lab and follow-up in 6 months   No orders of the defined types were placed in this encounter.   All questions were answered. The patient knows to call the clinic with any problems, questions or concerns. I spent 55 minutes counseling the patient face to face. The total time spent in the appointment was 60 minutes and more than 50% was on counseling.   This document serves as a record of services personally performed by Truitt Merle, MD. It was created on her behalf by Brandt Loosen, a trained medical scribe. The creation of this record is based on the scribe's personal observations and the provider's statements to them. This document has been checked and approved by the attending provider.   Truitt Merle, MD 05/21/2017 3:44 PM  Addendum Her lab results showed elevated liver enzymes, which is new from 2 months ago. I called pt. She is asymmetric, exam was unremarkable, this could be related to her new herbal supplements, or medications. I recommend her to stop letrozole, and do not start anastrozole, and a stop her  supplements for now, and repeat liver function in 2 weeks. If she has a persistent transaminitis on repeat his lab, I'll obtain a CT scan to evaluate further and ruled out metastasis. She voiced good understanding and agrees.  Truitt Merle

## 2017-05-22 ENCOUNTER — Encounter: Payer: Self-pay | Admitting: Hematology

## 2017-05-22 LAB — VITAMIN D 25 HYDROXY (VIT D DEFICIENCY, FRACTURES): Vitamin D, 25-Hydroxy: 27.2 ng/mL — ABNORMAL LOW (ref 30.0–100.0)

## 2017-05-22 LAB — CANCER ANTIGEN 27.29: CAN 27.29: 45.9 U/mL — AB (ref 0.0–38.6)

## 2017-05-27 ENCOUNTER — Telehealth: Payer: Self-pay | Admitting: *Deleted

## 2017-05-27 NOTE — Telephone Encounter (Signed)
Pt had called last fri asking about taking 10,000 units of Vit D3.  Left message on vm not to take for now per Dr Burr Medico.

## 2017-05-28 DIAGNOSIS — M2041 Other hammer toe(s) (acquired), right foot: Secondary | ICD-10-CM | POA: Diagnosis not present

## 2017-05-28 DIAGNOSIS — L97511 Non-pressure chronic ulcer of other part of right foot limited to breakdown of skin: Secondary | ICD-10-CM | POA: Diagnosis not present

## 2017-06-07 ENCOUNTER — Telehealth: Payer: Self-pay | Admitting: Hematology

## 2017-06-07 ENCOUNTER — Ambulatory Visit (HOSPITAL_BASED_OUTPATIENT_CLINIC_OR_DEPARTMENT_OTHER): Payer: Medicare Other

## 2017-06-07 DIAGNOSIS — C50911 Malignant neoplasm of unspecified site of right female breast: Secondary | ICD-10-CM | POA: Diagnosis present

## 2017-06-07 LAB — CBC WITH DIFFERENTIAL/PLATELET
BASO%: 1.2 % (ref 0.0–2.0)
Basophils Absolute: 0.1 10*3/uL (ref 0.0–0.1)
EOS ABS: 0.1 10*3/uL (ref 0.0–0.5)
EOS%: 2.2 % (ref 0.0–7.0)
HCT: 42.2 % (ref 34.8–46.6)
HEMOGLOBIN: 14 g/dL (ref 11.6–15.9)
LYMPH%: 24.5 % (ref 14.0–49.7)
MCH: 29.3 pg (ref 25.1–34.0)
MCHC: 33.1 g/dL (ref 31.5–36.0)
MCV: 88.4 fL (ref 79.5–101.0)
MONO#: 0.7 10*3/uL (ref 0.1–0.9)
MONO%: 14.1 % — AB (ref 0.0–14.0)
NEUT%: 58 % (ref 38.4–76.8)
NEUTROS ABS: 2.9 10*3/uL (ref 1.5–6.5)
Platelets: 159 10*3/uL (ref 145–400)
RBC: 4.78 10*6/uL (ref 3.70–5.45)
RDW: 14 % (ref 11.2–14.5)
WBC: 5.1 10*3/uL (ref 3.9–10.3)
lymph#: 1.2 10*3/uL (ref 0.9–3.3)

## 2017-06-07 LAB — COMPREHENSIVE METABOLIC PANEL
ALBUMIN: 3.7 g/dL (ref 3.5–5.0)
ALK PHOS: 133 U/L (ref 40–150)
ALT: 114 U/L — AB (ref 0–55)
AST: 66 U/L — AB (ref 5–34)
Anion Gap: 9 mEq/L (ref 3–11)
BILIRUBIN TOTAL: 0.58 mg/dL (ref 0.20–1.20)
BUN: 13 mg/dL (ref 7.0–26.0)
CO2: 26 meq/L (ref 22–29)
CREATININE: 0.9 mg/dL (ref 0.6–1.1)
Calcium: 9.6 mg/dL (ref 8.4–10.4)
Chloride: 101 mEq/L (ref 98–109)
EGFR: 68 mL/min/{1.73_m2} — AB (ref >90)
GLUCOSE: 84 mg/dL (ref 70–140)
Potassium: 4.9 mEq/L (ref 3.5–5.1)
SODIUM: 136 meq/L (ref 136–145)
TOTAL PROTEIN: 7.4 g/dL (ref 6.4–8.3)

## 2017-06-07 NOTE — Telephone Encounter (Signed)
I have called pt and discussed her lab results, LFTs have improved, but not normal yet. I recommend her to continue holding letrozole for now, we'll repeat lab and I'll see her back on August 8, we'll discuss switching her to another aromatase inhibitor if her liver functions continued to improve. Will send a scheduling message.  Truitt Merle  06/07/2017

## 2017-06-10 ENCOUNTER — Telehealth: Payer: Self-pay | Admitting: Hematology

## 2017-06-10 NOTE — Telephone Encounter (Signed)
Scheduled appt per sch m essage - left message with appt date and time and sent reminder letter in the mail.  

## 2017-06-12 ENCOUNTER — Ambulatory Visit (INDEPENDENT_AMBULATORY_CARE_PROVIDER_SITE_OTHER): Payer: Medicare Other | Admitting: Family Medicine

## 2017-06-12 ENCOUNTER — Encounter: Payer: Self-pay | Admitting: Family Medicine

## 2017-06-12 VITALS — BP 130/70 | HR 73 | Temp 98.3°F | Resp 16 | Ht 66.0 in | Wt 200.2 lb

## 2017-06-12 DIAGNOSIS — K529 Noninfective gastroenteritis and colitis, unspecified: Secondary | ICD-10-CM

## 2017-06-12 NOTE — Progress Notes (Signed)
Office Visit Note  Patient: Sarah Mata             Date of Birth: [birth year]           MRN: [id number]             PCP: Scot Jun, FNP Referring: Scot Jun, FNP Visit Date: 06/14/2017 Occupation: @GUAROCC @    Subjective:  Fatigue   History of Present Illness: Edia Pursifull is a 76 y.o. female with history of  autoimmune disease. She's been seen in consultation per request of her PCP. According to patient she was diagnosed with lupus in 1988. She states at the time she was having fatigue and joint pain. Her labs were consistent with autoimmune disease and she was under care of her rheumatologist. She recalls being on prednisone 60 mg a day which was tapered off. She was advised to take some medicines but she decided not to take medicine as she does not like to take medicines. She states after that her disease was manageable and she did not see a rheumatologist. She continues to have some fatigue. She moved from [place] to [place] in March 2018. She is trying to establish her physicians here. She also has history of breast cancer and she is trying to establish with her oncologist. She states currently her fatigue is better and she is not having asthma joint discomfort. She's been having diarrhea since she left [place]. She states she has several watery stools a day. She is waiting on a GI appointment.  Activities of Daily Living:  Patient reports morning stiffness for 5  minutes.   Patient Denies nocturnal pain.  Difficulty dressing/grooming: Denies Difficulty climbing stairs: Denies Difficulty getting out of chair: Denies Difficulty using hands for taps, buttons, cutlery, and/or writing: Denies   Review of Systems  Constitutional: Positive for fatigue.  HENT: Negative for mouth sores and mouth dryness.   Eyes: Positive for visual disturbance and dryness.  Respiratory: Negative.  Negative for cough, shortness of breath and difficulty breathing.     Cardiovascular: Negative.  Negative for palpitations, hypertension and irregular heartbeat.  Gastrointestinal: Negative.  Negative for blood in stool, constipation and diarrhea.  Endocrine: Negative.   Genitourinary: Negative.  Negative for nocturia.  Musculoskeletal: Positive for arthralgias, joint pain, joint swelling, muscle weakness and morning stiffness. Negative for myalgias and myalgias.  Skin: Positive for sensitivity to sunlight. Negative for color change, rash, hair loss and ulcers.  Neurological: Negative for headaches.  Hematological: Negative.  Negative for swollen glands.  Psychiatric/Behavioral: Negative.  Negative for depressed mood and sleep disturbance. The patient is not nervous/anxious.     PMFS History:  Patient Active Problem List   Diagnosis Date Noted  . Vitamin D deficiency 05/21/2017  . Cancer of right breast, stage 2 ([place]) 03/28/2017    Past Medical History:  Diagnosis Date  . Breast cancer ([place])   . Lupus     Family History  Problem Relation Age of Onset  . Cancer Mother        breast cancer   . Cancer Sister 72       breast cancer    Past Surgical History:  Procedure Laterality Date  . BREAST LUMPECTOMY Right 2015  . FOOT SURGERY Right 2010   Social History   Social History Narrative  . No narrative on file     Objective: Vital Signs: BP 124/72   Pulse 68   Resp 12   Ht 5\' 6"  (1.676 m)  Wt 201 lb (91.2 kg)   BMI 32.44 kg/m    Physical Exam  Constitutional: She is oriented to person, place, and time. She appears well-developed and well-nourished.  HENT:  Head: Normocephalic and atraumatic.  Nose: Nose normal.  Eyes: Conjunctivae and EOM are normal.  Neck: Normal range of motion.  Cardiovascular: Normal rate, regular rhythm, normal heart sounds and intact distal pulses.   Pulmonary/Chest: Effort normal and breath sounds normal.  Abdominal: Soft. Bowel sounds are normal.  Lymphadenopathy:    She has no cervical adenopathy.   Neurological: She is alert and oriented to person, place, and time.  Skin: Skin is warm and dry. Capillary refill takes less than 2 seconds.  Psychiatric: She has a normal mood and affect. Her behavior is normal.  Nursing note and vitals reviewed.    Musculoskeletal Exam: C-spine and thoracic lumbar spine good range of motion she is some thoracic kyphosis. Shoulder joints elbow joints wrist joint MCPs PIPs DIPs are good range of motion with no synovitis. She is good range of motion of her hip joints knee joints ankles MTPs PIPs DIPs.  CDAI Exam: No CDAI exam completed.    Investigation: No additional findings. 06/07/2017 CBC normal  CMP ALT 159 ALT 338, GFR 63,   05/21/2017 UA negative, ANA1:320 NO, SSA positive, RNP positive( ds, SCL 70, SSB, Smith negative)CRP  Imaging: No results found.  Speciality Comments: No specialty comments available.    Procedures:  No procedures performed Allergies: Tetracyclines & related   Assessment / Plan:     Visit Diagnoses: Autoimmune disease ([place]) - +ANA 1:320 nucleolar, +SSA,+RNP. Patient reports having these symptoms since 34s. She has not had any treatment for autoimmune disease for multiple years. She believes her symptoms have not changed and she does not want to initiate any therapy at this point. She was initially treated with prednisone. She denies any joint pain or joint swelling. She has some stiffness in her joints which is tolerable. I discussed possible use of Plaquenil in the future. At this point we will hold off any treatment has some she has elevated LFTs and having diarrhea.  I would like to obtain anticardiolipin, lupus anticoagulant, beta-2 GP 1, vitamin D, C3-C4 at her next visit.  Elevated LFTs: She believes is related to Femara. She states her LFTs are improving since she has discontinued the medication.  Diarrhea, unspecified type: She has appointment coming up with gastroenterologist.  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   Orders: No orders of the defined types were placed in this encounter.  No orders of the defined types were placed in this encounter.   Face-to-face time spent with patient was 50 minutes. Greater than 50% of time was spent in counseling and coordination of care.  Follow-Up Instructions: Return in about 3 months (around 09/14/2017) for Autoimmune disease.   Bo Merino, MD  Note - This record has been created using Editor, commissioning.  Chart creation errors have been sought, but may not always  have been located. Such creation errors do not reflect on  the standard of medical care.

## 2017-06-12 NOTE — Progress Notes (Signed)
Patient ID: Abia Monaco, female    DOB: 13-Sep-1941, 76 y.o.   MRN: [id number]  PCP: Scot Jun, FNP  Chief Complaint  Patient presents with  . Diarrhea    Subjective:  HPI Acadia Thammavong is a 76 y.o. female presents for evaluation of diarrhea. Feels that she needs to get a colonoscopy as she is having chronic watery stool for more than 1 year.  Dark colored and she eats heavy blueberries. Very low sugar. Oncologist discontinued the anastrozole due to elevated liver enzymes. She has opted not to try another antineoplastic medication.  She takes a high concentration of multivitamins and she feels that this therapy is more prudent, than starting a chemo drug. Leotta's last colonoscopy occurred several years ago and she reports that the findings were normal to her recollection. However she is uncertain of what state she resided in when colonoscopy was performed.  Social History   Social History  . Marital status: Widowed    Spouse name: N/A  . Number of children: N/A  . Years of education: N/A   Occupational History  . Not on file.   Social History Main Topics  . Smoking status: Never Smoker  . Smokeless tobacco: Never Used  . Alcohol use No  . Drug use: No  . Sexual activity: Not on file   Other Topics Concern  . Not on file   Social History Narrative  . No narrative on file    Family History  Problem Relation Age of Onset  . Cancer Mother        breast cancer   . Cancer Sister 50       breast cancer    Review of Systems See HPI  Patient Active Problem List   Diagnosis Date Noted  . Vitamin D deficiency 05/21/2017  . 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 Tetracyclines & Related Other (See Comments)    Stomach burning    Prior to Admission medications   Medication Sig Start Date End Date Taking? Authorizing Provider  Ascorbic Acid (VITAMIN C) 1000 MG tablet Take 1,000 mg by mouth daily.   Yes  [provider]  Coenzyme Q10 (CO Q-10) 100 MG CAPS Take by mouth.   Yes [provider]  anastrozole (ARIMIDEX) 1 MG tablet Take 1 tablet (1 mg total) by mouth daily. Patient not taking: Reported on 06/12/2017 05/21/17   Truitt Merle, MD  azelastine (ASTELIN) 0.1 % nasal spray  02/28/17   [provider]  Biotin 10000 MCG TABS Take by mouth.    [provider]  Cholecalciferol (VITAMIN D3) 3000 units TABS Take by mouth.    [provider]  fluticasone Asencion Islam) 50 MCG/ACT nasal spray  02/28/17   [provider]  letrozole (FEMARA) 2.5 MG tablet Take 2.5 mg by mouth daily.    [provider]  vitamin E 400 UNIT capsule Take 400 Units by mouth daily.    [provider]    Past Medical, Surgical Family and Social History reviewed and updated.    Objective:   Today's Vitals   06/12/17 1055  BP: 130/70  Pulse: 73  Resp: 16  Temp: 98.3 F (36.8 C)  TempSrc: Oral  SpO2: 97%  Weight: 200 lb 3.2 oz (90.8 kg)  Height: 5\' 6"  (1.676 m)    Wt Readings from Last 3 Encounters:  06/12/17 200 lb 3.2 oz (90.8 kg)  05/21/17 205 lb 3.2  oz (93.1 kg)  03/28/17 206 lb (93.4 kg)   Physical Exam  Constitutional: She is oriented to person, place, and time. She appears well-developed and well-nourished.  HENT:  Head: Normocephalic and atraumatic.  Eyes: Pupils are equal, round, and reactive to light. Conjunctivae are normal.  Neck: Normal range of motion. Neck supple. No thyromegaly present.  Cardiovascular: Normal rate, regular rhythm, normal heart sounds and intact distal pulses.   Pulmonary/Chest: Effort normal and breath sounds normal.  Abdominal: Soft. Bowel sounds are normal. She exhibits no distension and no mass. There is no tenderness. There is no rebound and no guarding.  Neurological: She is alert and oriented to person, place, and time.  Psychiatric: Her speech is normal and behavior is normal. Judgment and thought content  normal. Her mood appears anxious. Cognition and memory are normal.   Assessment & Plan:  1. Chronic diarrhea - Ambulatory referral to Gastroenterology-for colonoscopy   RTC: 5 months for medicare wellness exam     Carroll Sage. Kenton Kingfisher, MSN, FNP-C [facility]  [address] [place], [place] [address] [phone]

## 2017-06-14 ENCOUNTER — Encounter: Payer: Self-pay | Admitting: Gastroenterology

## 2017-06-14 ENCOUNTER — Telehealth: Payer: Self-pay

## 2017-06-14 ENCOUNTER — Ambulatory Visit (INDEPENDENT_AMBULATORY_CARE_PROVIDER_SITE_OTHER): Payer: Medicare Other | Admitting: Rheumatology

## 2017-06-14 ENCOUNTER — Encounter: Payer: Self-pay | Admitting: Rheumatology

## 2017-06-14 VITALS — BP 124/72 | HR 68 | Resp 12 | Ht 66.0 in | Wt 201.0 lb

## 2017-06-14 DIAGNOSIS — E559 Vitamin D deficiency, unspecified: Secondary | ICD-10-CM

## 2017-06-14 DIAGNOSIS — R945 Abnormal results of liver function studies: Secondary | ICD-10-CM

## 2017-06-14 DIAGNOSIS — D8989 Other specified disorders involving the immune mechanism, not elsewhere classified: Secondary | ICD-10-CM | POA: Diagnosis not present

## 2017-06-14 DIAGNOSIS — M359 Systemic involvement of connective tissue, unspecified: Secondary | ICD-10-CM

## 2017-06-14 DIAGNOSIS — R7989 Other specified abnormal findings of blood chemistry: Secondary | ICD-10-CM | POA: Diagnosis not present

## 2017-06-14 DIAGNOSIS — R197 Diarrhea, unspecified: Secondary | ICD-10-CM | POA: Diagnosis not present

## 2017-06-14 DIAGNOSIS — C50911 Malignant neoplasm of unspecified site of right female breast: Secondary | ICD-10-CM

## 2017-06-14 DIAGNOSIS — R5383 Other fatigue: Secondary | ICD-10-CM

## 2017-06-14 NOTE — Telephone Encounter (Signed)
Patient states that they will not schedule her colonoscopy because she needs records from the last one. Patient wants to know is there a way to override this or what should she do. Patient can't remember where her last one was

## 2017-06-14 NOTE — Patient Instructions (Signed)
Natural anti-inflammatories  You can purchase these at [employer], [employer] or online.  . Turmeric (capsules)  . Ginger (ginger root or capsules)  . Omega 3 (Fish, flax seeds, chia seeds, walnuts, almonds)  . Tart cherry (dried or extract)   Patient should be under the care of a physician while taking these supplements. This may not be reproduced without the permission of Dr. Mitsuru Dault.   Cervical Strain and Sprain Rehab Ask your health care provider which exercises are safe for you. Do exercises exactly as told by your health care provider and adjust them as directed. It is normal to feel mild stretching, pulling, tightness, or discomfort as you do these exercises, but you should stop right away if you feel sudden pain or your pain gets worse.Do not begin these exercises until told by your health care provider. Stretching and range of motion exercises These exercises warm up your muscles and joints and improve the movement and flexibility of your neck. These exercises also help to relieve pain, numbness, and tingling. Exercise A: Cervical side bend  1. Using good posture, sit on a stable chair or stand up. 2. Without moving your shoulders, slowly tilt your left / right ear to your shoulder until you feel a stretch in your neck muscles. You should be looking straight ahead. 3. Hold for __________ seconds. 4. Repeat with the other side of your neck. Repeat __________ times. Complete this exercise __________ times a day. Exercise B: Cervical rotation  1. Using good posture, sit on a stable chair or stand up. 2. Slowly turn your head to the side as if you are looking over your left / right shoulder. ? Keep your eyes level with the ground. ? Stop when you feel a stretch along the side and the back of your neck. 3. Hold for __________ seconds. 4. Repeat this by turning to your other side. Repeat __________ times. Complete this exercise __________ times a day. Exercise  C: Thoracic extension and pectoral stretch 1. Roll a towel or a small blanket so it is about 4 inches (10 cm) in diameter. 2. Lie down on your back on a firm surface. 3. Put the towel lengthwise, under your spine in the middle of your back. It should not be not under your shoulder blades. The towel should line up with your spine from your middle back to your lower back. 4. Put your hands behind your head and let your elbows fall out to your sides. 5. Hold for __________ seconds. Repeat __________ times. Complete this exercise __________ times a day. Strengthening exercises These exercises build strength and endurance in your neck. Endurance is the ability to use your muscles for a long time, even after your muscles get tired. Exercise D: Upper cervical flexion, isometric 1. Lie on your back with a thin pillow behind your head and a small rolled-up towel under your neck. 2. Gently tuck your chin toward your chest and nod your head down to look toward your feet. Do not lift your head off the pillow. 3. Hold for __________ seconds. 4. Release the tension slowly. Relax your neck muscles completely before you repeat this exercise. Repeat __________ times. Complete this exercise __________ times a day. Exercise E: Cervical extension, isometric  1. Stand about 6 inches (15 cm) away from a wall, with your back facing the wall. 2. Place a soft object, about 6-8 inches (15-20 cm) in diameter, between the back of your head and the wall. A soft object could be   a small pillow, a ball, or a folded towel. 3. Gently tilt your head back and press into the soft object. Keep your jaw and forehead relaxed. 4. Hold for __________ seconds. 5. Release the tension slowly. Relax your neck muscles completely before you repeat this exercise. Repeat __________ times. Complete this exercise __________ times a day. Posture and body mechanics  Body mechanics refers to the movements and positions of your body while you do  your daily activities. Posture is part of body mechanics. Good posture and healthy body mechanics can help to relieve stress in your body's tissues and joints. Good posture means that your spine is in its natural S-curve position (your spine is neutral), your shoulders are pulled back slightly, and your head is not tipped forward. The following are general guidelines for applying improved posture and body mechanics to your everyday activities. Standing  When standing, keep your spine neutral and keep your feet about hip-width apart. Keep a slight bend in your knees. Your ears, shoulders, and hips should line up.  When you do a task in which you stand in one place for a long time, place one foot up on a stable object that is 2-4 inches (5-10 cm) high, such as a footstool. This helps keep your spine neutral. Sitting   When sitting, keep your spine neutral and your keep feet flat on the floor. Use a footrest, if necessary, and keep your thighs parallel to the floor. Avoid rounding your shoulders, and avoid tilting your head forward.  When working at a desk or a computer, keep your desk at a height where your hands are slightly lower than your elbows. Slide your chair under your desk so you are close enough to maintain good posture.  When working at a computer, place your monitor at a height where you are looking straight ahead and you do not have to tilt your head forward or downward to look at the screen. Resting When lying down and resting, avoid positions that are most painful for you. Try to support your neck in a neutral position. You can use a contour pillow or a small rolled-up towel. Your pillow should support your neck but not push on it. This information is not intended to replace advice given to you by your health care provider. Make sure you discuss any questions you have with your health care provider. Document Released: 11/05/2005 Document Revised: 07/12/2016 Document Reviewed:  10/12/2015 Elsevier Interactive Patient Education  2018 [employer].  

## 2017-06-14 NOTE — Telephone Encounter (Signed)
Patient notified that referral was sent over [facility]

## 2017-06-14 NOTE — Telephone Encounter (Signed)
Advise Ms Myhre I can refer her to [facility] to see if they will proceed with the colonoscopy even though she doesn't have her prior records. I will complete note and then you can fax note and referral to [facility].  Carroll Sage. Kenton Kingfisher, MSN, FNP-C [facility]  [address] [place], [place] [address] [phone]

## 2017-06-15 DIAGNOSIS — K529 Noninfective gastroenteritis and colitis, unspecified: Secondary | ICD-10-CM | POA: Insufficient documentation

## 2017-06-17 ENCOUNTER — Telehealth: Payer: Self-pay

## 2017-06-18 NOTE — Telephone Encounter (Signed)
Spoke with patient and states that she is going to go see [facility] for colonoscopy. [facility] will call [facility] and cancel appointment with them.

## 2017-06-18 NOTE — Telephone Encounter (Signed)
Left a vm for patient

## 2017-06-26 ENCOUNTER — Encounter: Payer: Self-pay | Admitting: Hematology

## 2017-06-26 ENCOUNTER — Ambulatory Visit (HOSPITAL_BASED_OUTPATIENT_CLINIC_OR_DEPARTMENT_OTHER): Payer: Medicare Other | Admitting: Hematology

## 2017-06-26 ENCOUNTER — Other Ambulatory Visit (HOSPITAL_BASED_OUTPATIENT_CLINIC_OR_DEPARTMENT_OTHER): Payer: Medicare Other

## 2017-06-26 VITALS — BP 158/83 | HR 83 | Temp 98.3°F | Resp 18 | Ht 66.0 in | Wt 201.1 lb

## 2017-06-26 DIAGNOSIS — Z170 Estrogen receptor positive status [ER+]: Secondary | ICD-10-CM | POA: Diagnosis not present

## 2017-06-26 DIAGNOSIS — M329 Systemic lupus erythematosus, unspecified: Secondary | ICD-10-CM | POA: Diagnosis not present

## 2017-06-26 DIAGNOSIS — E559 Vitamin D deficiency, unspecified: Secondary | ICD-10-CM

## 2017-06-26 DIAGNOSIS — Z79811 Long term (current) use of aromatase inhibitors: Secondary | ICD-10-CM

## 2017-06-26 DIAGNOSIS — R945 Abnormal results of liver function studies: Secondary | ICD-10-CM

## 2017-06-26 DIAGNOSIS — C50911 Malignant neoplasm of unspecified site of right female breast: Secondary | ICD-10-CM | POA: Diagnosis not present

## 2017-06-26 DIAGNOSIS — R740 Nonspecific elevation of levels of transaminase and lactic acid dehydrogenase [LDH]: Secondary | ICD-10-CM | POA: Diagnosis not present

## 2017-06-26 LAB — COMPREHENSIVE METABOLIC PANEL
ALT: 246 U/L — ABNORMAL HIGH (ref 0–55)
ANION GAP: 7 meq/L (ref 3–11)
AST: 174 U/L — AB (ref 5–34)
Albumin: 3.5 g/dL (ref 3.5–5.0)
Alkaline Phosphatase: 140 U/L (ref 40–150)
BUN: 13.1 mg/dL (ref 7.0–26.0)
CALCIUM: 9.7 mg/dL (ref 8.4–10.4)
CHLORIDE: 99 meq/L (ref 98–109)
CO2: 28 mEq/L (ref 22–29)
Creatinine: 1 mg/dL (ref 0.6–1.1)
EGFR: 59 mL/min/{1.73_m2} — ABNORMAL LOW (ref >90)
Glucose: 98 mg/dl (ref 70–140)
POTASSIUM: 4.7 meq/L (ref 3.5–5.1)
Sodium: 134 mEq/L — ABNORMAL LOW (ref 136–145)
Total Bilirubin: 0.61 mg/dL (ref 0.20–1.20)
Total Protein: 7.1 g/dL (ref 6.4–8.3)

## 2017-06-26 LAB — CBC WITH DIFFERENTIAL/PLATELET
BASO%: 1.1 % (ref 0.0–2.0)
BASOS ABS: 0.1 10*3/uL (ref 0.0–0.1)
EOS%: 2.8 % (ref 0.0–7.0)
Eosinophils Absolute: 0.1 10*3/uL (ref 0.0–0.5)
HEMATOCRIT: 41.6 % (ref 34.8–46.6)
HGB: 14 g/dL (ref 11.6–15.9)
LYMPH%: 22.7 % (ref 14.0–49.7)
MCH: 29.6 pg (ref 25.1–34.0)
MCHC: 33.6 g/dL (ref 31.5–36.0)
MCV: 88.1 fL (ref 79.5–101.0)
MONO#: 0.7 10*3/uL (ref 0.1–0.9)
MONO%: 14 % (ref 0.0–14.0)
NEUT#: 2.8 10*3/uL (ref 1.5–6.5)
NEUT%: 59.4 % (ref 38.4–76.8)
Platelets: 172 10*3/uL (ref 145–400)
RBC: 4.72 10*6/uL (ref 3.70–5.45)
RDW: 14 % (ref 11.2–14.5)
WBC: 4.7 10*3/uL (ref 3.9–10.3)
lymph#: 1.1 10*3/uL (ref 0.9–3.3)

## 2017-06-26 NOTE — Progress Notes (Signed)
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 Grade 3, Ki-67 20%.      03/2014 Miscellaneous    mammaprint reviewed high-risk luminal type      04/2014 -  Radiation Therapy    Patient underwent radiation to the right chest wall      08/2014 -  Anti-estrogen oral therapy    Started Femara 2.25m        HISTORY OF PRESENTING ILLNESS: 05/21/17 Sarah Mata a  76y.o. [place] She has a known diagnosis of right-sided breast cancer. She presented with a palpable abnormality in the right breast which led to additional workup in March 2015. A core biopsy in April of 2015 showed an invasive ductal carcinoma grade 2, ER+/PR-/HER-2 negaive. She later underwent a right lumpectomy with axillary lymph node sampling. The tumor characteristics notes a 2.5 x 2 x 2 cm tumor with evidence of lymphovascular invasion. The tumor was high-rade at grade 3 with Kli 67 index at 20%. She underwent 4 cycles of cytoxan and Taxotere with growth factor support with Neupogen which she finished in October of 2015. Post treatment, she underwent radiation therapy to the right chest wall  and also was started on antiestrogen therapy with Letrozole 2.5 mg/day that she is complaint with.  She also suffers from history of anxiety.    Today, she presents to the clinic after [place] Besides hair loss and fatigue, she has no other problems with Letrozole. She has Lupus which she says might also cause her fatigue. [place] she fell and broke her nose. She denies any other chronic conditions.   CURRENT THERAPY: Surveillance, Letrozole t on hold due to elevated liver enzyme since 05/21/2017   INTERVAL HISTORY:  CAshrita Chrismeris here for a follow-up. She presents to the clinic today by herself. She is doing well overall, denies any significant pain, nausea, bloating or change of BM, she has gurgles in her abdomen when she moves. She eats fine and she does not feel bloating or pain.   She is not taking anastrozole or tylenol. She does not want to compromise her liver function. She is not taking any supplements or drinking alcohol. She has not been checked for hepatitis C. She reports her husband had hepatitis and seems to go into remission. He had past away in 2012. She does not have confidence in her PCP/NP. She sees her rheumatologist but wants to know can she just get her blood work done here. She will follow up with her GI Dr. Dr. NSilverio Decampwith LMaryanna Shapefor  her ongoing diarrhea since April, 2018. She also thinks dairy is upsetting her stomach so she will watch her diet.  She still has shakes in her right hand.  She does not wish to take anastrozole.    MEDICAL HISTORY:  Past Medical History:  Diagnosis Date  . Breast cancer ([place])   . Lupus     SURGICAL HISTORY: Past Surgical History:  Procedure Laterality Date  . BREAST LUMPECTOMY Right 2015  . FOOT SURGERY Right 2010    SOCIAL HISTORY: Social History   Social History  . Marital status: Widowed    Spouse name: Sarah Mata  . Number of children: Sarah Mata  . Years of education: Sarah Mata   Occupational History  . Not on  file.   Social History Main Topics  . Smoking status: Never Smoker  . Smokeless tobacco: Never Used  . Alcohol use No  . Drug use: No  . Sexual activity: Not on file   Other Topics Concern  . Not on file   Social History Narrative  . No narrative on file    FAMILY HISTORY: Family History  Problem Relation Age of Onset  . Cancer Mother        breast cancer   . Cancer Sister 69       breast cancer     ALLERGIES:  is allergic to tetracyclines & related.  MEDICATIONS:  Current Outpatient Prescriptions  Medication Sig Dispense Refill  . Ascorbic Acid (VITAMIN C) 1000 MG tablet Take 1,000 mg by mouth daily.    Marland Kitchen BIOTIN PO Take 500 mg by mouth daily.    . Calcium-Magnesium-Vitamin D (CALCIUM MAGNESIUM PO) Take 1 tablet by mouth daily.    . Cholecalciferol (VITAMIN D3) 3000 units TABS Take by mouth.    . Coenzyme Q10 (CO Q-10) 100 MG CAPS Take by mouth.    . fluticasone (FLONASE) 50 MCG/ACT nasal spray     . Specialty Vitamins Products (MAGNESIUM, AMINO ACID CHELATE,) 133 MG tablet Take 1 tablet by mouth daily.     Marland Kitchen anastrozole (ARIMIDEX) 1 MG tablet Take 1 tablet (1 mg total) by mouth daily. (Patient not taking: Reported on 06/12/2017) 30 tablet 0  . azelastine (ASTELIN) 0.1 % nasal spray      No current facility-administered medications for this visit.     REVIEW OF SYSTEMS:   Constitutional: Denies fevers, chills or abnormal night sweats Eyes: Denies blurriness of vision, double vision or watery eyes Ears, nose, mouth, throat, and face: Denies mucositis or sore throat Respiratory: Denies cough, dyspnea or wheezes Cardiovascular: Denies palpitation, chest discomfort or lower extremity swelling Gastrointestinal:  Denies nausea, heartburn or change in bowel habits (+) ongoing diarrhea  Skin: Denies abnormal skin rashes Lymphatics: Denies new lymphadenopathy or easy bruising Neurological:Denies numbness, tingling or new weaknesses (+)shaking of right hand Behavioral/Psych:  Mood is stable, no new changes  All other systems were reviewed with the patient and are negative.  PHYSICAL EXAMINATION:  ECOG PERFORMANCE STATUS: 0 - Asymptomatic  Vitals:   06/26/17 1332  BP: (!) 158/83  Pulse: 83  Resp: 18  Temp: 98.3 F (36.8 C)   Filed Weights   06/26/17 1332  Weight: 201 lb 1.6 oz (91.2 kg)    GENERAL:alert, no distress and comfortable SKIN: skin color, texture, turgor are normal, no rashes or significant lesions EYES: normal, conjunctiva are pink and non-injected, sclera clear OROPHARYNX:no exudate, no erythema and lips, buccal mucosa, and tongue normal  NECK: supple, thyroid normal size, non-tender, without nodularity  LYMPH:  no palpable lymphadenopathy in the cervical, axillary or inguinal LUNGS: clear to auscultation and percussion with normal breathing effort HEART: regular rate & rhythm and no murmurs and no lower extremity edema ABDOMEN:abdomen soft, non-tender and normal bowel sounds, no hepatomegaly  Musculoskeletal:no cyanosis of digits and no clubbing  PSYCH: alert & oriented x 3 with fluent speech NEURO: no focal motor/sensory deficits Breasts: Breast inspection showed them to be symmetrical with no nipple discharge, s/p . Surgical scar in the right breast and axilla has healed well. Palpation of the breasts and axilla revealed no obvious mass that I could appreciate, except surgical scar and a 1cm mass above the surgical scar of right breast, at 7:00 position    LABORATORY DATA:  I have reviewed the data as listed CBC Latest Ref Rng & Units 06/26/2017 06/07/2017 05/21/2017  WBC 3.9 - 10.3 10e3/uL 4.7 5.1 5.4  Hemoglobin 11.6 - 15.9 g/dL 14.0 14.0 13.9  Hematocrit 34.8 - 46.6 % 41.6 42.2 42.2  Platelets 145 - 400 10e3/uL 172 159 182   CMP Latest Ref Rng & Units 06/26/2017 06/07/2017 05/21/2017  Glucose 70 - 140 mg/dl 98 84 77  BUN 7.0 - 26.0 mg/dL 13.1 13.0 16.9  Creatinine 0.6 - 1.1 mg/dL 1.0 0.9 0.9  Sodium 136 - 145 mEq/L 134(L) 136 138    Potassium 3.5 - 5.1 mEq/L 4.7 4.9 4.5  Chloride 98 - 110 mmol/L - - -  CO2 22 - 29 mEq/L _0 Calcium 8.4 - 10.4 mg/dL 9.7 9.6 10.1  Total Protein 6.4 - 8.3 g/dL 7.1 7.4 7.6  Total Bilirubin 0.20 - 1.20 mg/dL 0.61 0.58 0.57  Alkaline Phos 40 - 150 U/L 140 133 149  AST 5 - 34 U/L 174(HH) 66(H) 159(H)  ALT 0 - 55 U/L 246(H) 114(H) 338(HH)     RADIOGRAPHIC STUDIES: I have personally reviewed the radiological images as listed and agreed with the findings in the report. No results found.  ASSESSMENT & PLAN: 76 yo woman   1. 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  -She is status post lumpectomy, adjuvant chemotherapy and irradiation, currently on adjuvant letrozole, tolerating well overall. -We discussed the risk of cancer recurrence. She has high risk for recurrence based on the mammaprint result  - last mammogram was Nov 30, 2016, I will try to get a copy  -She is clinically doing well, exam was unremarkable, no clinical concern for recurrence. -I recommend her to continue adjuvant antiestrogen therapy. Due to shaking of right hand, she is very concerned this could be side effect of the letrozole. I recommended her to switch to Anastrozole 8m to take once daily. She has not started du eto her abnormal LFTs  --Labs reviewed her liver function is elevated and worsened since last month. I will order a CT AP scan to further investigate.  -Due to possible exposure to hepatitis in the past, I highly suggest she gets tested. I discussed her liver function can be effected by her Lupus as well. Will copy note to Dr. NSilverio Decampfor her to have work-up for abnormal LFTs if her CT negative for cancer recurrence  -I discussed that with continued elevated liver enzymes without being on anastrozole, I suggest she should be able to start anastrozole, however she would like  to hold until her work up completes  -F/u in 3 months, or sooner if CT  scan showes abnormal findings    2. Transaminates -this was discovered on routine lab work on 05/21/2017 -due to her persistently elevated LFTs, I will obtain a CT abdomen and pelvis with contrast to rule out liver metastasis   -Her husband had chronic hepatitis, and passsed away in 2012, I recommend her to check for hepB and C -autoimmune hepatitis is also possible due to her lupus.  -she would like to have her liver work up at Dr.  Woodward Ku office, she is scheduled to see her on 07/02/17 for chronic diarrhea    3. Bone health -I discussed the potential osteopenia and osteoporosis from aromatase inhibitor -She is on calcium and vitamin D -She is due for bone density scan, I'll schedule for her  4. Lupus -She will continue f/u with Dr. Estanislado Pandy   5. Financial Support -She is concerned with the cost of her scans and tests because any possible deductible -I will refer to financial assistant.    PLAN -Will copy note to Dr. Silverio Decamp for her to test any possible GI causes to her abnormal liver function and hepatitis testing -bone density scan within this year -CT abdomen w contrast in 1 week, I will call her with the results. If no evidence of cancer recurrence recommend her to start anastrozole   -Lab and f/u in 3 months     Orders Placed This Encounter  Procedures  . CT Abdomen W Contrast    History of breast cancer, rule out liver metastasis    Standing Status:   Future    Standing Expiration Date:   06/26/2018    Order Specific Question:   If indicated for the ordered procedure, I authorize the administration of contrast media per Radiology protocol    Answer:   Yes    Order Specific Question:   Preferred imaging location?    Answer:   [facility]    Order Specific Question:   Radiology Contrast Protocol - do NOT remove file path    Answer:   _0 [email or web address]     All questions were answered. The patient knows to call the clinic with any problems, questions or concerns. I spent 25 minutes counseling the patient face to face. The total time spent in the appointment was 30 minutes and more than 50% was on counseling.  This document serves as a record of services personally performed by Truitt Merle, MD. It was created on her behalf by Joslyn Devon, a trained medical scribe. The creation of this record is based on the scribe's personal observations and the provider's statements to them. This document has been checked and approved by the attending provider.    Truitt Merle, MD 06/26/2017

## 2017-06-27 ENCOUNTER — Telehealth: Payer: Self-pay | Admitting: *Deleted

## 2017-06-27 ENCOUNTER — Telehealth: Payer: Self-pay | Admitting: Hematology

## 2017-06-27 NOTE — Telephone Encounter (Signed)
I called the patient back, and discussed option of liver MRI, and ultrasound, in addition to CT scan. I encouraged her to talk to our financial officer about the cost of CT scan. If the cost is prohibitive, she'll call me back and I'll change CT to ultrasound.   Truitt Merle MD

## 2017-06-27 NOTE — Telephone Encounter (Signed)
Received vm from pt requesting a call back. TCT patient  And spoke with her. She is voicing concerns with having CT Scan to r/o liver mets. Her concerns are related to the cost-out of pocket- and also she believes that the 'radiation' from the scan could cause her to have cancer.  She would like to know if there is another procedure available that would give comparable answers without the use of radiation.  Pt was not able to see the financial counselor yesterday d/t computer issues in the cancer center.  Pt requesting a call back from Dr. Burr Medico about her concerns.

## 2017-06-27 NOTE — Telephone Encounter (Signed)
Scheduled appt per 8/8 los - left message with appt date and time

## 2017-06-28 ENCOUNTER — Telehealth: Payer: Self-pay | Admitting: *Deleted

## 2017-06-28 ENCOUNTER — Telehealth: Payer: Self-pay

## 2017-06-28 ENCOUNTER — Telehealth: Payer: Self-pay | Admitting: Rheumatology

## 2017-06-28 NOTE — Telephone Encounter (Signed)
Attempted to contact patient and left message to call the office.

## 2017-06-28 NOTE — Telephone Encounter (Signed)
Patient has had new labs done recently with Cancer doctor. Patient would like to discuss labs. Patient will have lab results sent over from cancer doctor. Question Lupus.

## 2017-06-28 NOTE — Telephone Encounter (Signed)
Pt called for code for CT to check with her insurance if it is covered. Billing's number given to pt. Prior Josem Kaufmann is not done yet. inbasket sent to darlena with cc pod 1. She believes Dr Burr Medico wants coloscopy done prior to CT, but sequencing not mentioned in 8/8 OV note.  Her initial visit to GI is 8/14.

## 2017-06-28 NOTE — Telephone Encounter (Signed)
TC from patient reuqesting recent lab work be fax'd to Dr. Imogene Burn office. This was done as requested.

## 2017-06-28 NOTE — Telephone Encounter (Signed)
Pt called back. She wants Dr. Burr Medico to know that she will go ahead with CT scan as her portion of of the bill will be less than $50. She awaiting call from [employer]

## 2017-07-01 NOTE — Telephone Encounter (Signed)
LMAM for Pt to call back.

## 2017-07-01 NOTE — Telephone Encounter (Signed)
This nurse also tried faxing 06-26-2017 lab results unsuccessfully.  Sent via [employer] routing service.

## 2017-07-02 ENCOUNTER — Telehealth: Payer: Self-pay | Admitting: Gastroenterology

## 2017-07-02 ENCOUNTER — Other Ambulatory Visit (INDEPENDENT_AMBULATORY_CARE_PROVIDER_SITE_OTHER): Payer: Medicare Other

## 2017-07-02 ENCOUNTER — Encounter: Payer: Self-pay | Admitting: Gastroenterology

## 2017-07-02 ENCOUNTER — Ambulatory Visit (INDEPENDENT_AMBULATORY_CARE_PROVIDER_SITE_OTHER): Payer: Medicare Other | Admitting: Gastroenterology

## 2017-07-02 VITALS — BP 122/78 | HR 74 | Ht 66.0 in | Wt 194.0 lb

## 2017-07-02 DIAGNOSIS — R197 Diarrhea, unspecified: Secondary | ICD-10-CM

## 2017-07-02 DIAGNOSIS — K588 Other irritable bowel syndrome: Secondary | ICD-10-CM | POA: Diagnosis not present

## 2017-07-02 DIAGNOSIS — R1011 Right upper quadrant pain: Secondary | ICD-10-CM

## 2017-07-02 LAB — COMPREHENSIVE METABOLIC PANEL
ALBUMIN: 4.2 g/dL (ref 3.5–5.2)
ALK PHOS: 123 U/L — AB (ref 39–117)
ALT: 193 U/L — ABNORMAL HIGH (ref 0–35)
AST: 117 U/L — AB (ref 0–37)
BUN: 11 mg/dL (ref 6–23)
CO2: 30 mEq/L (ref 19–32)
CREATININE: 0.83 mg/dL (ref 0.40–1.20)
Calcium: 9.4 mg/dL (ref 8.4–10.5)
Chloride: 98 mEq/L (ref 96–112)
GFR: 71.49 mL/min (ref >60.00)
Glucose, Bld: 99 mg/dL (ref 70–99)
POTASSIUM: 5 meq/L (ref 3.5–5.1)
SODIUM: 132 meq/L — AB (ref 135–145)
TOTAL PROTEIN: 6.8 g/dL (ref 6.0–8.3)
Total Bilirubin: 0.6 mg/dL (ref 0.2–1.2)

## 2017-07-02 LAB — CBC WITH DIFFERENTIAL/PLATELET
Basophils Absolute: 0.1 10*3/uL (ref 0.0–0.1)
Basophils Relative: 1.3 % (ref 0.0–3.0)
EOS PCT: 1.7 % (ref 0.0–5.0)
Eosinophils Absolute: 0.1 10*3/uL (ref 0.0–0.7)
HCT: 43 % (ref 36.0–46.0)
HEMOGLOBIN: 14.3 g/dL (ref 12.0–15.0)
Lymphocytes Relative: 23.4 % (ref 12.0–46.0)
Lymphs Abs: 1.1 10*3/uL (ref 0.7–4.0)
MCHC: 33.3 g/dL (ref 30.0–36.0)
MCV: 89.2 fl (ref 78.0–100.0)
MONO ABS: 0.8 10*3/uL (ref 0.1–1.0)
Monocytes Relative: 17.5 % — ABNORMAL HIGH (ref 3.0–12.0)
Neutro Abs: 2.7 10*3/uL (ref 1.4–7.7)
Neutrophils Relative %: 56.1 % (ref 43.0–77.0)
Platelets: 172 10*3/uL (ref 150.0–400.0)
RBC: 4.82 Mil/uL (ref 3.87–5.11)
RDW: 14.4 % (ref 11.5–15.5)
WBC: 4.8 10*3/uL (ref 4.0–10.5)

## 2017-07-02 NOTE — Progress Notes (Addendum)
Sarah Mata    [id number]    12/20/1940  Primary Care Physician:Harris, Carroll Sage, FNP  Referring Physician: Scot Jun, [place], [place] [address]  Chief complaint:  Diarrhea, abdominal pain  HPI: 76 year old female here for new patient visit with complaints of diarrhea intermittently for past few months, that has improved in the last 1 week after she changed her diet. Patient denies any blood in stool that she has noticed dark stools and thinks it varies with her diet and hasn't noticed any black stool in the past 1 week. Patient is a poor historian and unable to get good timeline on her symptoms. She hasn't seen a gastroenterologist in over 10 years and thinks her last colonoscopy and upper endoscopy was more than 10 years ago and doesn't recall if it was done in [place] or [place]. No nausea or vomiting. Denies any dysphagia. She has intermittent pain in the right upper quadrant.  Patient was noted to have elevated AST 159 and ALT 338 05/21/2017. She had normal LFT on 03/28/2017 with AST 21 and ALT 12. She has positive ANA with a titer 1:320, anti-smooth muscle antibody negative. Positive for systemic lupus. CA 27. 29 is elevated at 45.9.  Other relevant history includes history of right breast cancer status post lumpectomy in 2015, s/p adjuvant chemotherapy   Outpatient Encounter Prescriptions as of 07/02/2017  Medication Sig  . Ascorbic Acid (VITAMIN C) 1000 MG tablet Take 1,000 mg by mouth daily.  Marland Kitchen azelastine (ASTELIN) 0.1 % nasal spray   . b complex vitamins tablet Take 1 tablet by mouth daily.  Marland Kitchen BIOTIN PO Take 500 mg by mouth daily.  . Calcium-Magnesium-Vitamin D (CALCIUM MAGNESIUM PO) Take 1 tablet by mouth daily.  . Cholecalciferol (VITAMIN D3) 3000 units TABS Take by mouth.  . Coenzyme Q10 (CO Q-10) 100 MG CAPS Take by mouth.  . fluticasone (FLONASE) 50 MCG/ACT nasal spray   . Specialty Vitamins Products (MAGNESIUM, AMINO  ACID CHELATE,) 133 MG tablet Take 1 tablet by mouth daily.   . [DISCONTINUED] anastrozole (ARIMIDEX) 1 MG tablet Take 1 tablet (1 mg total) by mouth daily.   No facility-administered encounter medications on file as of 07/02/2017.     Allergies as of 07/02/2017 - Review Complete 07/02/2017  Allergen Reaction Noted  . Tetracyclines & related Other (See Comments) 05/21/2017    Past Medical History:  Diagnosis Date  . Breast cancer ([place])   . Lupus     Past Surgical History:  Procedure Laterality Date  . BREAST LUMPECTOMY Right 2015  . FOOT SURGERY Right 2010    Family History  Problem Relation Age of Onset  . Cancer Mother        breast cancer   . Cancer Sister 46       breast cancer     Social History   Social History  . Marital status: Widowed    Spouse name: N/A  . Number of children: N/A  . Years of education: N/A   Occupational History  . Not on file.   Social History Main Topics  . Smoking status: Never Smoker  . Smokeless tobacco: Never Used  . Alcohol use No  . Drug use: No  . Sexual activity: Not on file   Other Topics Concern  . Not on file   Social History Narrative  . No narrative on file      Review of systems: Review  of Systems  Constitutional: Negative for fever and chills.  HENT: Negative.   Eyes: Negative for blurred vision.  Respiratory: Negative for cough, shortness of breath and wheezing.   Cardiovascular: Negative for chest pain and palpitations.  Gastrointestinal: as per HPI Genitourinary: Negative for dysuria, urgency, frequency and hematuria.  Musculoskeletal: Positive for myalgias, back pain and joint pain.  Skin: Negative for itching and rash.  Neurological: Negative for dizziness, tremors, focal weakness, seizures and loss of consciousness.  Endo/Heme/Allergies: Negative for seasonal allergies.  Psychiatric/Behavioral: Negative for depression, suicidal ideas and hallucinations.  All other systems reviewed and are  negative.   Physical Exam: Vitals:   07/02/17 0932  BP: 122/78  Pulse: 74   Body mass index is 31.31 kg/m. Gen:      No acute distress HEENT:  EOMI, sclera anicteric Neck:     No masses; no thyromegaly Lungs:    Clear to auscultation bilaterally; normal respiratory effort CV:         Regular rate and rhythm; no murmurs Abd:      + bowel sounds; soft, non-tender; no palpable masses, no distension Ext:    No edema; adequate peripheral perfusion Skin:      Warm and dry; no rash Neuro: alert and oriented x 3 Psych: normal mood and affect  Data Reviewed:  Reviewed labs, radiology imaging, old records and pertinent past GI work up   Assessment and Plan/Recommendations:  76 year old female with history of right breast cancer status post lumpectomy, adjuvant chemotherapy and irradiation here for evaluation with complaints of right upper quadrant abdominal pain, diarrhea and abnormal LFT  Diarrhea has resolved per patient with dietary changes, but was ongoing for months in the past Denies any nocturnal symptoms or blood in stool ? Irritable bowel syndrome predominant diarrhea but cannot exclude microscopic colitis She also has not had colorectal cancer screening for over 10 years Schedule for colonoscopy to evaluate The risks and benefits as well as alternatives of endoscopic procedure(s) have been discussed and reviewed. All questions answered. The patient agrees to proceed.  Right upper quadrant abdominal pain: She is scheduled for CT abdomen and pelvis by Dr. 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 Denzil Magnuson , MD [phone] Mon-Fri 8a-5p [phone] after 5p, weekends, holidays  CC: Scot Jun, FNP

## 2017-07-02 NOTE — Telephone Encounter (Signed)
DPR on file. Information on the voice mail of her cell phone.

## 2017-07-02 NOTE — Telephone Encounter (Signed)
Her labs have resulted. Appears to have improvement in Alk Phos, AST and the ALT. Sodium is low.

## 2017-07-02 NOTE — Patient Instructions (Signed)
Please contact our office to schedule your colonoscopy if you can find a person to come with you and stay with you for your procedure   Your physician has requested that you go to the basement for the following lab work before leaving today: CBC CMET

## 2017-07-02 NOTE — Telephone Encounter (Signed)
AST and ALT are improving. Recheck LFT in 3 months. Please inform patient the results. Thanks

## 2017-07-02 NOTE — Telephone Encounter (Signed)
Just finished clinic will contact the patient tomorrow to see if she is ready to schedule her colonoscopy, she will need to be scheduled for a previsit , if she calls she only needs to be scheduled doesn't really need to speak with me

## 2017-07-03 ENCOUNTER — Other Ambulatory Visit (INDEPENDENT_AMBULATORY_CARE_PROVIDER_SITE_OTHER): Payer: Medicare Other

## 2017-07-03 ENCOUNTER — Other Ambulatory Visit: Payer: Self-pay

## 2017-07-03 DIAGNOSIS — R945 Abnormal results of liver function studies: Secondary | ICD-10-CM

## 2017-07-03 DIAGNOSIS — C7A00 Malignant carcinoid tumor of unspecified site: Secondary | ICD-10-CM | POA: Diagnosis not present

## 2017-07-03 DIAGNOSIS — K7689 Other specified diseases of liver: Secondary | ICD-10-CM

## 2017-07-03 DIAGNOSIS — C50919 Malignant neoplasm of unspecified site of unspecified female breast: Secondary | ICD-10-CM | POA: Diagnosis not present

## 2017-07-03 DIAGNOSIS — K7589 Other specified inflammatory liver diseases: Secondary | ICD-10-CM

## 2017-07-03 DIAGNOSIS — Z79899 Other long term (current) drug therapy: Secondary | ICD-10-CM

## 2017-07-03 DIAGNOSIS — D8989 Other specified disorders involving the immune mechanism, not elsewhere classified: Secondary | ICD-10-CM

## 2017-07-03 LAB — PROTIME-INR
INR: 1.1 ratio — AB (ref 0.8–1.0)
Prothrombin Time: 11.6 s (ref 9.6–13.1)

## 2017-07-03 NOTE — Telephone Encounter (Signed)
Spoke with the patient about her results. Per her request, a copy will be mailed to her.

## 2017-07-03 NOTE — Telephone Encounter (Signed)
Returned patients call, She is still working on transportation and a care partner so she can schedule colonoscopy,  Still can not schedule at this time  , She does not have to speak with me to schedule  She will need a previsit

## 2017-07-04 ENCOUNTER — Telehealth: Payer: Self-pay | Admitting: Gastroenterology

## 2017-07-04 ENCOUNTER — Other Ambulatory Visit: Payer: Self-pay

## 2017-07-04 ENCOUNTER — Telehealth: Payer: Self-pay | Admitting: *Deleted

## 2017-07-04 LAB — HEPATITIS PANEL, ACUTE
HCV AB: NONREACTIVE
HEP B S AG: NONREACTIVE
Hep A IgM: NONREACTIVE
Hep B C IgM: NONREACTIVE

## 2017-07-04 LAB — AFP TUMOR MARKER: AFP-Tumor Marker: 2.3 ng/mL (ref <6.1)

## 2017-07-04 LAB — HEPATITIS A ANTIBODY, IGM: HEP A IGM: NONREACTIVE

## 2017-07-04 LAB — HEPATITIS B SURFACE ANTIGEN: HEP B S AG: NONREACTIVE

## 2017-07-04 LAB — HEPATITIS B CORE ANTIBODY, TOTAL: Hep B Core Total Ab: NONREACTIVE

## 2017-07-04 LAB — HEPATITIS B CORE ANTIBODY, IGM: Hep B C IgM: NONREACTIVE

## 2017-07-04 LAB — HEPATITIS B SURFACE ANTIBODY,QUALITATIVE: Hep B S Ab: NONREACTIVE

## 2017-07-04 LAB — IGG: IGG (IMMUNOGLOBIN G), SERUM: 1209 mg/dL (ref 694–1618)

## 2017-07-04 LAB — HEPATITIS C ANTIBODY: HCV AB: NONREACTIVE

## 2017-07-04 NOTE — Telephone Encounter (Signed)
"  Returning call I missed today from Dr. Ernestina Penna office.  No message was left."   Next scheduled F/U 09-27-2017.  Received voicemail.  Message left for collaborative to return call to patient.

## 2017-07-05 ENCOUNTER — Telehealth: Payer: Self-pay | Admitting: Hematology

## 2017-07-05 ENCOUNTER — Telehealth: Payer: Self-pay | Admitting: Gastroenterology

## 2017-07-05 NOTE — Telephone Encounter (Signed)
Patient is returning a phone call from Dr Burr Medico.  She said she was not sure what it was about but maybe because I cancelled my CT Scan because of the radiation.  Patient stated that it was ok to leave a message on voice mail  Call back is [phone]

## 2017-07-05 NOTE — Telephone Encounter (Signed)
I called pt and encouraged her to do the CT abdomen scan for her abnormal LFTs. We reviewed the minimum risk of radiation exposure, and the importance of the scan result to guide [place] for further workup and treatment plan. Patient is scared to find out if she has liver metastasis. After a long discussion, she finally agreed to have CT scan done. She will wait [facility] call her to schedule it.  I'll call her after her CT scan to review the results. She appreciated the call.  Truitt Merle  07/05/2017

## 2017-07-05 NOTE — Telephone Encounter (Signed)
Spoke with the patient

## 2017-07-08 ENCOUNTER — Ambulatory Visit (AMBULATORY_SURGERY_CENTER): Payer: Self-pay

## 2017-07-08 VITALS — Ht 66.0 in | Wt 198.4 lb

## 2017-07-08 DIAGNOSIS — K529 Noninfective gastroenteritis and colitis, unspecified: Secondary | ICD-10-CM

## 2017-07-08 MED ORDER — NA SULFATE-K SULFATE-MG SULF 17.5-3.13-1.6 GM/177ML PO SOLN: 1.0000 | Freq: Once | ORAL | 0 refills | Status: AC

## 2017-07-08 NOTE — Progress Notes (Signed)
Denies allergies to eggs or soy products. Denies complication of anesthesia or sedation. Denies use of weight loss medication. Denies use of O2.   Emmi instructions declined.  

## 2017-07-09 ENCOUNTER — Telehealth: Payer: Self-pay | Admitting: *Deleted

## 2017-07-09 ENCOUNTER — Ambulatory Visit (HOSPITAL_COMMUNITY)
Admission: RE | Admit: 2017-07-09 | Discharge: 2017-07-09 | Disposition: A | Discharge status: Home / Self Care | Payer: Medicare Other | Source: Ambulatory Visit | Attending: Hematology | Admitting: Hematology

## 2017-07-09 DIAGNOSIS — I700 Atherosclerosis of aorta: Secondary | ICD-10-CM | POA: Diagnosis not present

## 2017-07-09 DIAGNOSIS — C50911 Malignant neoplasm of unspecified site of right female breast: Secondary | ICD-10-CM | POA: Diagnosis not present

## 2017-07-09 DIAGNOSIS — R945 Abnormal results of liver function studies: Secondary | ICD-10-CM | POA: Diagnosis not present

## 2017-07-09 MED ORDER — IOPAMIDOL (ISOVUE-300) INJECTION 61%
100.0000 mL | Freq: Once | INTRAVENOUS | Status: AC | PRN
  Administered 2017-07-09: 100 mL via INTRAVENOUS

## 2017-07-09 MED ORDER — IOPAMIDOL (ISOVUE-300) INJECTION 61%
INTRAVENOUS | Status: AC
  Filled 2017-07-09: qty 100

## 2017-07-09 NOTE — Telephone Encounter (Signed)
"  Scheduled later today for CT to check liver.  Can you confirm if I understood Dr. Burr Medico say I do not need to fast for this scan.  I ate a meal this morning.  Just woke up from a nap, ate about four cashews and walnuts around 12:50 pm.  I chewed them really well."  Called CT department.  Informed this "should be fine just to make sure she drinks."   Passed these instructions with this call.  Emphasized no solid food after 12:30 pm.  Fast four hours before is the plan for any abdominal scans.  Drink contrast today at 2:30pm and 3:30 pm.  Arrive at 4:15 pm to register.  Results will be read by radiologist today but allow provider review before receives result call from [facility].

## 2017-07-10 ENCOUNTER — Telehealth: Payer: Self-pay | Admitting: *Deleted

## 2017-07-10 NOTE — Telephone Encounter (Signed)
Called patient confirming receipt of messages.  When provider reviews results will call patient.    "Call me twice as I may not be able to get to the phone in time.  I'll be in church later today so I may have to whisper."

## 2017-07-10 NOTE — Telephone Encounter (Signed)
Received call from pt asking for results of CT liver done yest.  Call back # is [phone].  Message to Dr Burr Medico.

## 2017-07-11 ENCOUNTER — Telehealth: Payer: Self-pay | Admitting: *Deleted

## 2017-07-11 ENCOUNTER — Other Ambulatory Visit: Payer: Self-pay | Admitting: Hematology

## 2017-07-11 MED ORDER — ANASTROZOLE 1 MG PO TABS: 1.0000 mg | ORAL_TABLET | Freq: Every day | ORAL | 3 refills | Status: DC

## 2017-07-11 NOTE — Telephone Encounter (Signed)
Called pt & reached her today & informed that CT report was good per Dr Ernestina Penna instuctions.  Asked if she had anastrazole at home & if so Dr Burr Medico would like her to start.  She is concerned about taking another drug & would like to discuss with Dr Burr Medico.  Message to Dr Burr Medico to call pt.

## 2017-07-11 NOTE — Telephone Encounter (Signed)
I called pt back and encouraged her to start anastrozole. She was previously on adjuvant letrozole for 3 years, stopped in July 2018 due to some hair loss, weight gain, and tremor. She had stage II breast cancer, mammaprint showed high risk, she required adjuvant chemotherapy. I strike encouraged her to continue aromatase inhibitor, and switch to anastrozole. Potential benefits and side effects were reviewed with her again. She finally agreed to try. I called into her pharmacy today.  She will continue follow-up with her gastroenterologist Dr. Silverio Decamp for colonoscopy and her abnormal liver function issue.  I will see her back in 3 months as it's scheduled.  Truitt Merle  07/11/2017

## 2017-07-11 NOTE — Telephone Encounter (Signed)
Message left for pt to return call to discuss good results of CT.

## 2017-07-17 ENCOUNTER — Encounter: Payer: Medicare Other | Admitting: Gastroenterology

## 2017-07-18 ENCOUNTER — Ambulatory Visit: Payer: Self-pay | Admitting: Rheumatology

## 2017-07-25 ENCOUNTER — Telehealth: Payer: Self-pay | Admitting: Hematology

## 2017-07-25 NOTE — Telephone Encounter (Signed)
[place]

## 2017-07-29 ENCOUNTER — Telehealth: Payer: Self-pay | Admitting: Gastroenterology

## 2017-07-29 NOTE — Telephone Encounter (Signed)
Pt has questions regarding what she can have during her 5 day diet for colon on 9.18.18. Pt concerned about foods: Coconut water, bone broth, cooked spinach, bananas, and butter, and creamy peanut butter. Pt states okay to leave detailed message on vm. Pt had pv 8.20.18.

## 2017-07-29 NOTE — Telephone Encounter (Signed)
Called patient, no answer, Left detailed message about the foods listed. Encouraged patient to call me back with any questions or concerns.

## 2017-07-30 ENCOUNTER — Telehealth: Payer: Self-pay | Admitting: Gastroenterology

## 2017-07-31 ENCOUNTER — Telehealth: Payer: Self-pay | Admitting: Family Medicine

## 2017-07-31 ENCOUNTER — Other Ambulatory Visit: Payer: Self-pay | Admitting: Family Medicine

## 2017-07-31 NOTE — Telephone Encounter (Signed)
Called patient. No answer, left message for patient to call us back.

## 2017-07-31 NOTE — Telephone Encounter (Signed)
Called patient again. No answer, left message. Told her that she may have apple juice and green colored drinks. Encouraged patient to call us back before 5 pm.

## 2017-07-31 NOTE — Telephone Encounter (Signed)
Sarah Mata, a 76 year old female called answering service complaining of possible ulcer and/or UTI. Attempted to contact patient and did not receive answer. I will attempt to call patient again. I recommend that patient report to the emergency department.    Donia Pounds  MSN, FNP-C Patient [facility] [address] [place], [place] [address] [phone]

## 2017-08-01 ENCOUNTER — Ambulatory Visit (INDEPENDENT_AMBULATORY_CARE_PROVIDER_SITE_OTHER): Payer: Medicare Other | Admitting: Family Medicine

## 2017-08-01 VITALS — BP 130/72 | HR 71 | Temp 98.6°F | Resp 18 | Ht 66.0 in | Wt 198.0 lb

## 2017-08-01 DIAGNOSIS — R3915 Urgency of urination: Secondary | ICD-10-CM

## 2017-08-01 DIAGNOSIS — R251 Tremor, unspecified: Secondary | ICD-10-CM

## 2017-08-01 LAB — POCT URINALYSIS DIP (DEVICE)
BILIRUBIN URINE: NEGATIVE
GLUCOSE, UA: NEGATIVE mg/dL
Hgb urine dipstick: NEGATIVE
KETONES UR: NEGATIVE mg/dL
Leukocytes, UA: NEGATIVE
Nitrite: NEGATIVE
PROTEIN: NEGATIVE mg/dL
Urobilinogen, UA: 0.2 mg/dL (ref 0.0–1.0)
pH: 6 (ref 5.0–8.0)

## 2017-08-01 NOTE — Progress Notes (Signed)
Subjective:    Patient ID: [name], female    DOB: [birth year], 76 y.o.   MRN: [id number]  HPI  Atara Paterson, a 76 year old female with a history of irritable bowel syndrome, lupus, and right breast cancer presents complaining of urinary frequency and a right hand tremor.   She says that urinary urgency has been occurring over the past several days. Patient has been evaluated for this problem in the past. She denies fatigue, fever, flank pain, low back pain, dysuria, or incontinence. She has increased water intake with mild relief. She has not attempted any OTC interventions to alleviate symptom.   Ms. Loretto is complaining of a right hand tremor for the past several years. She maintains that tremor has recently become more noticeable. She initially noticed tremor after sustaining a fall several years ago. She reported to the emergency department shortly following the fall for a full work up including a CT scan, which was negative. She says that tremor occurs intermittently.  Tremor primarily involves the right hand.  Symptoms are currently of moderate and intermittent severity. Tremor exacerbated by fine movement such as walking or griping small items. Tremor is alleviated at rest. . She also describes symptoms of unilateral hand tremor and difficulty in initiating movement. She denies voice change, sleep disturbance, drooling during sleep (wet pillows), rigidity, postural changes, difficulty with walking, difficult with posture, difficulty with swallowing and balance problems.    Past Medical History:  Diagnosis Date  . Allergy   . Arthritis   . Breast cancer ([place])   . Heart murmur   . Lupus    Social History   Social History  . Marital status: Widowed    Spouse name: N/A  . Number of children: N/A  . Years of education: N/A   Occupational History  . Not on file.   Social History Main Topics  . Smoking status: Never Smoker  . Smokeless tobacco: Never Used  . Alcohol use  No  . Drug use: No  . Sexual activity: Not on file   Other Topics Concern  . Not on file   Social History Narrative  . No narrative on file   Review of Systems  Constitutional: Negative for activity change, appetite change, fatigue, fever and unexpected weight change.  HENT: Negative.   Eyes: Negative.   Respiratory: Negative.   Gastrointestinal: Positive for abdominal pain (left side).  Genitourinary: Positive for frequency.  Musculoskeletal: Negative for gait problem.  Neurological: Positive for tremors.  Hematological: Negative.   Psychiatric/Behavioral: Negative.        Objective:   Physical Exam  HENT:  Head: Normocephalic and atraumatic.  Right Ear: External ear normal.  Left Ear: External ear normal.  Mouth/Throat: Oropharynx is clear and moist.  Eyes: Pupils are equal, round, and reactive to light. Conjunctivae and EOM are normal.  Neck: Normal range of motion. Neck supple.  Cardiovascular: Normal rate, regular rhythm, normal heart sounds and intact distal pulses.   Pulmonary/Chest: Effort normal and breath sounds normal.  Abdominal: There is tenderness in the left lower quadrant.  Neurological: She is alert. She displays tremor. She displays no atrophy. No cranial nerve deficit. She exhibits normal muscle tone. Coordination and gait normal.  Reflex Scores:      Tricep reflexes are 2+ on the right side and 2+ on the left side.      Bicep reflexes are 2+ on the right side and 2+ on the left side.  Brachioradialis reflexes are 2+ on the right side and 2+ on the left side.      Patellar reflexes are 2+ on the right side and 2+ on the left side.      Achilles reflexes are 2+ on the right side and 2+ on the left side.     BP 130/72   Pulse 71   Temp 98.6 F (37 C)   Resp 18   Ht 5\' 6"  (1.676 m)   Wt 198 lb (89.8 kg)   BMI 31.96 kg/m   Assessment & Plan:  1. Urinary urgency Reviewed urinalysis, unremarkable, no signs of UTI.  Discussed starting a trial  of Oxybutynin for signs of overactive bladder. Patient is not interested in adding additional medication at this time. She mostly uses herbal medications.  Recommend pelvic floor exercises to help with this problem. Kegel exercise, also known as pelvic floor exercise, consists of repeatedly contracting and relaxing the muscles that form part of the pelvic floor, now sometimes colloquially referred to as the "Kegel muscles." The exercise can be performed multiple times each day, for several minutes at a time, for one to three months, to begin to have an effect.   - POCT urinalysis dip (device)  2. Tremor of right hand Patient has absence of other neurological signs on physical exam. Observed tremor primarily to hand. Right hand tremor elicited with arms held outstretched. Tremor was increased with goal directed movements. Ms. Pagnotta gait was normal without histrionic qualities. I will send a referral to neurology to rule out underlying neurologic disorder.   - Ambulatory referral to Neurology   General Health Maintenance:  Discussed high fiber diet at length, given written information Discussed kegel exercises.  Recommend that she maintain a diet diary to review on next month with her primary provider Molli Barrows, FNP). I do not recommend starting a PPI for occasional heartburn. Dietary changes are recommended at this time.  Follow up with GI for scheduled colonoscopy   Greater than 50% of appointment was spent on counseling   RTC: Follow up with Molli Barrows, FNP as previously scheduled    Donia Pounds  MSN, FNP-C Patient [facility] [address] [place], [place] [address] [phone]

## 2017-08-01 NOTE — Patient Instructions (Addendum)
1. Will send a referral to neurology for fine tremors 2. Recommend a diet diary over the next month to review with Sarah Barrows, FNP. I do not recommend starting a  PPI  3. Reviewed urinalysis, no urinalysis 4. Follow up for colonoscopy as scheduled.      Kegel Exercises Kegel exercises help strengthen the muscles that support the rectum, vagina, small intestine, bladder, and uterus. Doing Kegel exercises can help:  Improve bladder and bowel control.  Improve sexual response.  Reduce problems and discomfort during pregnancy.  Kegel exercises involve squeezing your pelvic floor muscles, which are the same muscles you squeeze when you try to stop the flow of urine. The exercises can be done while sitting, standing, or lying down, but it is best to vary your position. Phase 1 exercises 1. Squeeze your pelvic floor muscles tight. You should feel a tight lift in your rectal area. If you are a female, you should also feel a tightness in your vaginal area. Keep your stomach, buttocks, and legs relaxed. 2. Hold the muscles tight for up to 10 seconds. 3. Relax your muscles. Repeat this exercise 50 times a day or as many times as told by your health care provider. Continue to do this exercise for at least 4-6 weeks or for as long as told by your health care provider. This information is not intended to replace advice given to you by your health care provider. Make sure you discuss any questions you have with your health care provider. Document Released: 10/22/2012 Document Revised: 06/30/2016 Document Reviewed: 09/25/2015 Elsevier Interactive Patient Education  [employer].   After colonoscopy, start high fiber diet.  High-Fiber Diet Fiber, also called dietary fiber, is a type of carbohydrate found in fruits, vegetables, whole grains, and beans. A high-fiber diet can have many health benefits. Your health care provider may recommend a high-fiber diet to help:  Prevent constipation.  Fiber can make your bowel movements more regular.  Lower your cholesterol.  Relieve hemorrhoids, uncomplicated diverticulosis, or irritable bowel syndrome.  Prevent overeating as part of a weight-loss plan.  Prevent heart disease, type 2 diabetes, and certain cancers.  What is my plan? The recommended daily intake of fiber includes:  38 grams for men under age 24.  16 grams for men over age 72.  16 grams for women under age 9.  31 grams for women over age 14.  You can get the recommended daily intake of dietary fiber by eating a variety of fruits, vegetables, grains, and beans. Your health care provider may also recommend a fiber supplement if it is not possible to get enough fiber through your diet. What do I need to know about a high-fiber diet?  Fiber supplements have not been widely studied for their effectiveness, so it is better to get fiber through food sources.  Always check the fiber content on thenutrition facts label of any prepackaged food. Look for foods that contain at least 5 grams of fiber per serving.  Ask your dietitian if you have questions about specific foods that are related to your condition, especially if those foods are not listed in the following section.  Increase your daily fiber consumption gradually. Increasing your intake of dietary fiber too quickly may cause bloating, cramping, or gas.  Drink plenty of water. Water helps you to digest fiber. What foods can I eat? Grains Whole-grain breads. Multigrain cereal. Oats and oatmeal. Brown rice. Barley. Bulgur wheat. [place]. Bran muffins. Popcorn. Rye wafer crackers. Vegetables Sweet  potatoes. Spinach. Kale. Artichokes. Cabbage. Broccoli. Green peas. Carrots. Squash. Fruits Berries. Pears. Apples. Oranges. Avocados. Prunes and raisins. Dried figs. Meats and Other Protein Sources Navy, kidney, pinto, and soy beans. Split peas. Lentils. Nuts and seeds. Dairy Fiber-fortified  yogurt. Beverages Fiber-fortified soy milk. Fiber-fortified orange juice. Other Fiber bars. The items listed above may not be a complete list of recommended foods or beverages. Contact your dietitian for more options. What foods are not recommended? Grains White bread. Pasta made with refined flour. White rice. Vegetables Fried potatoes. Canned vegetables. Well-cooked vegetables. Fruits Fruit juice. Cooked, strained fruit. Meats and Other Protein Sources Fatty cuts of meat. Fried Sales executive or fried fish. Dairy Milk. Yogurt. Cream cheese. Sour cream. Beverages Soft drinks. Other Cakes and pastries. Butter and oils. The items listed above may not be a complete list of foods and beverages to avoid. Contact your dietitian for more information. What are some tips for including high-fiber foods in my diet?  Eat a wide variety of high-fiber foods.  Make sure that half of all grains consumed each day are whole grains.  Replace breads and cereals made from refined flour or white flour with whole-grain breads and cereals.  Replace white rice with brown rice, bulgur wheat, or millet.  Start the day with a breakfast that is high in fiber, such as a cereal that contains at least 5 grams of fiber per serving.  Use beans in place of meat in soups, salads, or pasta.  Eat high-fiber snacks, such as berries, raw vegetables, nuts, or popcorn. This information is not intended to replace advice given to you by your health care provider. Make sure you discuss any questions you have with your health care provider. Document Released: 11/05/2005 Document Revised: 04/12/2016 Document Reviewed: 04/20/2014 Elsevier Interactive Patient Education  2017 Elsevier Inc.     Tremor A tremor is trembling or shaking that you cannot control. Most tremors affect the hands or arms. Tremors can also affect the head, vocal cords, face, and other parts of the body. There are many types of tremors. Common types  include: Essential tremor. These usually occur in people over the age of 75. It may run in families and can happen in otherwise healthy people. Resting tremor. These occur when the muscles are at rest, such as when your hands are resting in your lap. People with Parkinson disease often have resting tremors. Postural tremor. These occur when you try to hold a pose, such as keeping your hands outstretched. Kinetic tremor. These occur during purposeful movement, such as trying to touch a finger to your nose. Task-specific tremor. These may occur when you perform tasks such as handwriting, speaking, or standing. Psychogenic tremor. These dramatically lessen or disappear when you are distracted. They can happen in people of all ages.  Some types of tremors have no known cause. Tremors can also be a symptom of nervous system problems (neurological disorders) that may occur with aging. Some tremors go away with treatment while others do not. Follow these instructions at home: Watch your tremor for any changes. The following actions may help to lessen any discomfort you are feeling: Take medicines only as directed by your health care provider. Limit alcohol intake to no more than 1 drink per day for nonpregnant women and 2 drinks per day for men. One drink equals 12 oz of beer, 5 oz of wine, or 1 oz of hard liquor. Do not use any tobacco products, including cigarettes, chewing tobacco, or electronic cigarettes. If you  need help quitting, ask your health care provider. Avoid extreme heat or cold. Limit the amount of caffeine you consumeas directed by your health care provider. Try to get 8 hours of sleep each night. Find ways to manage your stress, such as meditation or yoga. Keep all follow-up visits as directed by your health care provider. This is important.  Contact a health care provider if: You start having a tremor after starting a new medicine. You have tremor with other symptoms such  as: Numbness. Tingling. Pain. Weakness. Your tremor gets worse. Your tremor interferes with your day-to-day life. This information is not intended to replace advice given to you by your health care provider. Make sure you discuss any questions you have with your health care provider. Document Released: 10/26/2002 Document Revised: 07/08/2016 Document Reviewed: 05/03/2014 Elsevier Interactive Patient Education  [employer].

## 2017-08-02 ENCOUNTER — Encounter: Payer: Self-pay | Admitting: Family Medicine

## 2017-08-02 DIAGNOSIS — R251 Tremor, unspecified: Secondary | ICD-10-CM | POA: Insufficient documentation

## 2017-08-02 HISTORY — DX: Tremor, unspecified: R25.1

## 2017-08-06 ENCOUNTER — Ambulatory Visit (AMBULATORY_SURGERY_CENTER): Payer: Medicare Other | Admitting: Gastroenterology

## 2017-08-06 ENCOUNTER — Encounter: Payer: Self-pay | Admitting: Gastroenterology

## 2017-08-06 VITALS — BP 176/80 | HR 71 | Temp 97.8°F | Resp 21 | Ht 66.0 in | Wt 198.4 lb

## 2017-08-06 DIAGNOSIS — K6389 Other specified diseases of intestine: Secondary | ICD-10-CM | POA: Diagnosis not present

## 2017-08-06 DIAGNOSIS — R197 Diarrhea, unspecified: Secondary | ICD-10-CM | POA: Diagnosis not present

## 2017-08-06 DIAGNOSIS — Z1211 Encounter for screening for malignant neoplasm of colon: Secondary | ICD-10-CM

## 2017-08-06 DIAGNOSIS — M329 Systemic lupus erythematosus, unspecified: Secondary | ICD-10-CM | POA: Diagnosis not present

## 2017-08-06 MED ORDER — SODIUM CHLORIDE 0.9 % IV SOLN: 500.0000 mL | INTRAVENOUS | Status: DC

## 2017-08-06 NOTE — Patient Instructions (Signed)
**   Handouts given on diverticulosis ** Biopsies were taken today!   YOU HAD AN ENDOSCOPIC PROCEDURE TODAY AT [facility]:   Refer to the procedure report that was given to you for any specific questions about what was found during the examination.  If the procedure report does not answer your questions, please call your gastroenterologist to clarify.  If you requested that your care partner not be given the details of your procedure findings, then the procedure report has been included in a sealed envelope for you to review at your convenience later.  YOU SHOULD EXPECT: Some feelings of bloating in the abdomen. Passage of more gas than usual.  Walking can help get rid of the air that was put into your GI tract during the procedure and reduce the bloating. If you had a lower endoscopy (such as a colonoscopy or flexible sigmoidoscopy) you may notice spotting of blood in your stool or on the toilet paper. If you underwent a bowel prep for your procedure, you may not have a normal bowel movement for a few days.  Please Note:  You might notice some irritation and congestion in your nose or some drainage.  This is from the oxygen used during your procedure.  There is no need for concern and it should clear up in a day or so.  SYMPTOMS TO REPORT IMMEDIATELY:   Following lower endoscopy (colonoscopy or flexible sigmoidoscopy):  Excessive amounts of blood in the stool  Significant tenderness or worsening of abdominal pains  Swelling of the abdomen that is new, acute  Fever of 100F or higher   For urgent or emergent issues, a gastroenterologist can be reached at any hour by calling [phone].   DIET:  We do recommend a small meal at first, but then you may proceed to your regular diet.  Drink plenty of fluids but you should avoid alcoholic beverages for 24 hours.  ACTIVITY:  You should plan to take it easy for the rest of today and you should NOT DRIVE or use heavy machinery until  tomorrow (because of the sedation medicines used during the test).    FOLLOW UP: Our staff will call the number listed on your records the next business day following your procedure to check on you and address any questions or concerns that you may have regarding the information given to you following your procedure. If we do not reach you, we will leave a message.  However, if you are feeling well and you are not experiencing any problems, there is no need to return our call.  We will assume that you have returned to your regular daily activities without incident.  If any biopsies were taken you will be contacted by phone or by letter within the next 1-3 weeks.  Please call us at [phone] if you have not heard about the biopsies in 3 weeks.    SIGNATURES/CONFIDENTIALITY: You and/or your care partner have signed paperwork which will be entered into your electronic medical record.  These signatures attest to the fact that that the information above on your After Visit Summary has been reviewed and is understood.  Full responsibility of the confidentiality of this discharge information lies with you and/or your care-partner.

## 2017-08-06 NOTE — Progress Notes (Signed)
Pt's states no medical or surgical changes since previsit or office visit.  Patient concerned about coding of procedure. Patient's  MD visits and previsits documented  reason for visit is diarrhea. Verified with Dr. Silverio Decamp. Patient verbalized understanding.

## 2017-08-06 NOTE — Progress Notes (Signed)
Called to room to assist during endoscopic procedure.  Patient ID and intended procedure confirmed with present staff. Received instructions for my participation in the procedure from the performing physician.  

## 2017-08-06 NOTE — Op Note (Signed)
[facility] Patient Name: Sarah Mata Procedure Date: 08/06/2017 1:46 PM MRN: [id number] Endoscopist: Mauri Pole , MD Age: 76 Referring MD:  Date of Birth: [birth year] Gender: Female Account #: [id number] Procedure:                Colonoscopy Indications:              Screening for colorectal malignant neoplasm Medicines:                Monitored Anesthesia Care Procedure:                Pre-Anesthesia Assessment:                           - Prior to the procedure, a History and Physical                            was performed, and patient medications and                            allergies were reviewed. The patient's tolerance of                            previous anesthesia was also reviewed. The risks                            and benefits of the procedure and the sedation                            options and risks were discussed with the patient.                            All questions were answered, and informed consent                            was obtained. Prior Anticoagulants: The patient has                            taken no previous anticoagulant or antiplatelet                            agents. ASA Grade Assessment: II - A patient with                            mild systemic disease. After reviewing the risks                            and benefits, the patient was deemed in                            satisfactory condition to undergo the procedure.                           After obtaining informed consent, the colonoscope  was passed under direct vision. Throughout the                            procedure, the patient's blood pressure, pulse, and                            oxygen saturations were monitored continuously. The                            Colonoscope was introduced through the anus and                            advanced to the the cecum, identified by                            appendiceal orifice  and ileocecal valve. The                            colonoscopy was performed without difficulty. The                            patient tolerated the procedure well. The quality                            of the bowel preparation was adequate. The                            ileocecal valve, appendiceal orifice, and rectum                            were photographed. Scope In: 2:16:08 PM Scope Out: 2:35:46 PM Scope Withdrawal Time: 0 hours 15 minutes 19 seconds  Total Procedure Duration: 0 hours 19 minutes 38 seconds  Findings:                 The perianal and digital rectal examinations were                            normal.                           Multiple small-mouthed diverticula were found in                            the sigmoid colon and descending colon.                           Non-bleeding internal hemorrhoids were found during                            retroflexion. The hemorrhoids were small.                           The colon (entire examined portion) appeared  normal. Biopsies for histology were taken with a                            cold forceps from the right colon and left colon                            for evaluation of microscopic colitis give patient                            report of chronic diarrhea. Complications:            No immediate complications. Estimated Blood Loss:     Estimated blood loss was minimal. Impression:               - Diverticulosis in the sigmoid colon and in the                            descending colon.                           - Non-bleeding internal hemorrhoids.                           - The entire examined colon is normal. Biopsied. Recommendation:           - Patient has a contact number available for                            emergencies. The signs and symptoms of potential                            delayed complications were discussed with the                            patient. Return to  normal activities tomorrow.                            Written discharge instructions were provided to the                            patient.                           - Resume previous diet.                           - Continue present medications.                           - Await pathology results.                           - Repeat colonoscopy in 10 years for screening                            purposes.                           -  Return to GI clinic in 4 weeks. Mauri Pole, MD 08/06/2017 2:52:45 PM This report has been signed electronically.

## 2017-08-06 NOTE — Progress Notes (Signed)
Report to PACU, RN, vss, BBS= Clear.  

## 2017-08-07 ENCOUNTER — Telehealth: Payer: Self-pay | Admitting: *Deleted

## 2017-08-07 DIAGNOSIS — D2371 Other benign neoplasm of skin of right lower limb, including hip: Secondary | ICD-10-CM | POA: Diagnosis not present

## 2017-08-07 DIAGNOSIS — M2041 Other hammer toe(s) (acquired), right foot: Secondary | ICD-10-CM | POA: Diagnosis not present

## 2017-08-07 NOTE — Telephone Encounter (Signed)
  Follow up Call-  Call back number 08/06/2017  Post procedure Call Back phone  # [phone]  Permission to leave phone message Yes     No answer at number left yesterday- no identifiers- will attempt call later today Lenard Galloway RN

## 2017-08-07 NOTE — Telephone Encounter (Signed)
  Follow up Call-  Call back number 08/06/2017  Post procedure Call Back phone  # [phone]  Permission to leave phone message Yes     Patient questions:  Do you have a fever, pain , or abdominal swelling? No. Pain Score  0 *  Have you tolerated food without any problems? Yes.    Have you been able to return to your normal activities? Yes.    Do you have any questions about your discharge instructions: Diet   No. Medications  No. Follow up visit  No.  Do you have questions or concerns about your Care? No.  Actions: * If pain score is 4 or above: No action needed, pain <4.

## 2017-08-09 ENCOUNTER — Encounter: Payer: Self-pay | Admitting: Gastroenterology

## 2017-08-16 ENCOUNTER — Encounter: Payer: Self-pay | Admitting: Family Medicine

## 2017-08-16 ENCOUNTER — Ambulatory Visit (INDEPENDENT_AMBULATORY_CARE_PROVIDER_SITE_OTHER): Payer: Medicare Other | Admitting: Family Medicine

## 2017-08-16 VITALS — BP 140/70 | HR 74 | Temp 98.0°F | Resp 14 | Ht 66.0 in | Wt 199.0 lb

## 2017-08-16 DIAGNOSIS — L03113 Cellulitis of right upper limb: Secondary | ICD-10-CM | POA: Diagnosis not present

## 2017-08-16 DIAGNOSIS — W57XXXA Bitten or stung by nonvenomous insect and other nonvenomous arthropods, initial encounter: Secondary | ICD-10-CM | POA: Diagnosis not present

## 2017-08-16 LAB — CBC WITH DIFFERENTIAL/PLATELET
BASOS PCT: 1.5 %
Basophils Absolute: 80 cells/uL (ref 0–200)
EOS ABS: 191 {cells}/uL (ref 15–500)
Eosinophils Relative: 3.6 %
HEMATOCRIT: 43.3 % (ref 35.0–45.0)
Hemoglobin: 14 g/dL (ref 11.7–15.5)
LYMPHS ABS: 1145 {cells}/uL (ref 850–3900)
MCH: 28.9 pg (ref 27.0–33.0)
MCHC: 32.3 g/dL (ref 32.0–36.0)
MCV: 89.3 fL (ref 80.0–100.0)
MPV: 11.2 fL (ref 7.5–12.5)
Monocytes Relative: 11.6 %
NEUTROS PCT: 61.7 %
Neutro Abs: 3270 cells/uL (ref 1500–7800)
Platelets: 167 10*3/uL (ref 140–400)
RBC: 4.85 10*6/uL (ref 3.80–5.10)
RDW: 13.7 % (ref 11.0–15.0)
Total Lymphocyte: 21.6 %
WBC: 5.3 10*3/uL (ref 3.8–10.8)
WBCMIX: 615 {cells}/uL (ref 200–950)

## 2017-08-16 MED ORDER — AMOXICILLIN-POT CLAVULANATE 875-125 MG PO TABS
1.0000 | ORAL_TABLET | Freq: Two times a day (BID) | ORAL | 0 refills | Status: DC
Start: 2017-08-16 — End: 2017-09-16

## 2017-08-16 MED ORDER — CYCLOBENZAPRINE HCL 7.5 MG PO TABS: 7.5000 mg | ORAL_TABLET | Freq: Two times a day (BID) | ORAL | 0 refills | Status: DC

## 2017-08-16 MED ORDER — FLUCONAZOLE 150 MG PO TABS: 150.0000 mg | ORAL_TABLET | Freq: Once | ORAL | 0 refills | Status: AC

## 2017-08-16 NOTE — Progress Notes (Signed)
Patient ID: Sarah Mata, female    DOB: [birth year], 76 y.o.   MRN: [id number]  PCP: Scot Jun, FNP  Chief Complaint  Patient presents with  . Insect Bite    right arm    Subjective:  HPI Sarah Mata is a 76 y.o. female presents for evaluation of right arm insect bite. [name] reports performing outdoor work early 3 days prior and noticed later that evening the presence of a cluster of bumps on her upper right arm. Upon awakening the following day the lesions had increased in size, the area was red, itching. She covered with a bandage and did not apply any ointment nor has she taken any medication.   Uncertain of what type of insect could have bitten her.  Social History   Social History  . Marital status: Widowed    Spouse name: N/A  . Number of children: N/A  . Years of education: N/A   Occupational History  . Not on file.   Social History Main Topics  . Smoking status: Never Smoker  . Smokeless tobacco: Never Used  . Alcohol use No  . Drug use: No  . Sexual activity: Not on file   Other Topics Concern  . Not on file   Social History Narrative  . No narrative on file    Family History  Problem Relation Age of Onset  . Cancer Mother        breast cancer   . Cancer Sister 44       breast cancer   . Colon cancer Neg Hx   . Esophageal cancer Neg Hx   . Pancreatic cancer Neg Hx   . Rectal cancer Neg Hx   . Stomach cancer Neg Hx    Review of Systems See HPI  Patient Active Problem List   Diagnosis Date Noted  . Tremor of right hand 08/02/2017  . Chronic diarrhea 06/15/2017  . Vitamin D deficiency 05/21/2017  . 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 Tetracyclines & Related Other (See Comments)    Stomach burning    Prior to Admission medications   Medication Sig Start Date End Date Taking? Authorizing Provider  anastrozole (ARIMIDEX) 1 MG tablet Take 1 tablet (1 mg total) by mouth daily. 07/11/17    Truitt Merle, MD  Ascorbic Acid (VITAMIN C) 1000 MG tablet Take 1,000 mg by mouth daily.    [provider]  azelastine (ASTELIN) 0.1 % nasal spray  02/28/17   [provider]  b complex vitamins tablet Take 1 tablet by mouth daily.    [provider]  BIOTIN PO Take 500 mg by mouth daily.    [provider]  Calcium-Magnesium-Vitamin D (CALCIUM MAGNESIUM PO) Take 1 tablet by mouth daily.    [provider]  Cholecalciferol (VITAMIN D3) 3000 units TABS Take by mouth.    [provider]  Coenzyme Q10 (CO Q-10) 100 MG CAPS Take by mouth.    [provider]  fluticasone Asencion Islam) 50 MCG/ACT nasal spray  02/28/17   [provider]  Specialty Vitamins Products (MAGNESIUM, AMINO ACID CHELATE,) 133 MG tablet Take 1 tablet by mouth daily.     [provider]    Past Medical, Surgical Family and Social History reviewed and updated.    Objective:   Today's Vitals   08/16/17 0922  BP: 140/70  Pulse: 74  Resp: 14  Temp: 98 F (36.7 C)  TempSrc: Oral  SpO2: 96%  Weight: 199 lb (90.3 kg)  Height: 5\' 6"  (1.676 m)    Wt Readings from Last 3 Encounters:  08/16/17 199 lb (90.3 kg)  08/06/17 198 lb 6.4 oz (90 kg)  08/01/17 198 lb (89.8 kg)   Physical Exam  Constitutional: She appears well-developed and well-nourished.  HENT:  Head: Normocephalic and atraumatic.  Cardiovascular: Normal rate, regular rhythm, normal heart sounds and intact distal pulses.   Pulmonary/Chest: Effort normal and breath sounds normal.  Musculoskeletal:       Arms: Skin: Rash noted. Rash is pustular. There is erythema.  Psychiatric: She has a normal mood and affect. Her behavior is normal. Judgment and thought content normal.   Assessment & Plan:  1. Cellulitis of arm, right 2. Insect bite, initial encounter  Rash/lesions appearance is consistent with a possible spider bite. Erythema is localized and present beneath the vesicles,  therefore will treat for likely cellulitis and obtain CBC to evaluate white count.  RTC: 7 days to reevaluate rash.  Carroll Sage. Kenton Kingfisher, MSN, FNP-C [facility]  [address] [place], [place] [address] [phone]

## 2017-08-16 NOTE — Patient Instructions (Addendum)
  For skin infection start Augmentin 1 tablet twice daily with food. I have prescribed diflucan 150 mg 1 tablet to take with antibiotic in case you develop a             Insect Bite, Adult An insect bite can make your skin red, itchy, and swollen. Some insects can spread disease to people with a bite. However, most insect bites do not lead to disease, and most are not serious. Follow these instructions at home: Bite area care  Do not scratch the bite area.  Keep the bite area clean and dry.  Wash the bite area every day with soap and water as told by your doctor.  Check the bite area every day for signs of infection. Check for: ? More redness, swelling, or pain. ? Fluid or blood. ? Warmth. ? Pus. Managing pain, itching, and swelling  You may put any of these on the bite area as told by your doctor: ? A baking soda paste. ? Cortisone cream. ? Calamine lotion.  If directed, put ice on the bite area. ? Put ice in a plastic bag. ? Place a towel between your skin and the bag. ? Leave the ice on for 20 minutes, 2-3 times a day. Medicines  Take medicines or put medicines on your skin only as told by your doctor.  If you were prescribed an antibiotic medicine, use it as told by your doctor. Do not stop using the antibiotic even if your condition improves. General instructions  Keep all follow-up visits as told by your doctor. This is important. How is this prevented? To help you have a lower risk of insect bites:  When you are outside, wear clothing that covers your arms and legs.  Use insect repellent. The best insect repellents have: ? An active ingredient of DEET, picaridin, oil of lemon eucalyptus (OLE), or IR3535. ? Higher amounts of DEET or another active ingredient than other repellents have.  If your home windows do not have screens, think about putting some in.  Contact a doctor if:  You have more redness, swelling, or pain in the bite area.  You have  fluid, blood, or pus coming from the bite area.  The bite area feels warm.  You have a fever. Get help right away if:  You have joint pain.  You have a rash.  You have shortness of breath.  You feel more tired or sleepy than you normally do.  You have neck pain.  You have a headache.  You feel weaker than you normally do.  You have chest pain.  You have pain in your belly.  You feel sick to your stomach (nauseous) or you throw up (vomit). Summary  An insect bite can make your skin red, itchy, and swollen.  Do not scratch the bite area, and keep it clean and dry.  Ice can help with pain and itching from the bite. This information is not intended to replace advice given to you by your health care provider. Make sure you discuss any questions you have with your health care provider. Document Released: 11/02/2000 Document Revised: 06/07/2016 Document Reviewed: 03/23/2015 Elsevier Interactive Patient Education  2018 [employer].

## 2017-08-17 ENCOUNTER — Encounter (HOSPITAL_COMMUNITY): Payer: Self-pay

## 2017-08-17 ENCOUNTER — Emergency Department (HOSPITAL_COMMUNITY)
Admission: EM | Admit: 2017-08-17 | Discharge: 2017-08-17 | Disposition: A | Discharge status: Home / Self Care | Payer: Medicare Other | Attending: Emergency Medicine | Admitting: Emergency Medicine

## 2017-08-17 DIAGNOSIS — Z79899 Other long term (current) drug therapy: Secondary | ICD-10-CM | POA: Diagnosis not present

## 2017-08-17 DIAGNOSIS — B029 Zoster without complications: Secondary | ICD-10-CM | POA: Insufficient documentation

## 2017-08-17 DIAGNOSIS — R21 Rash and other nonspecific skin eruption: Secondary | ICD-10-CM | POA: Diagnosis present

## 2017-08-17 MED ORDER — VALACYCLOVIR HCL 1 G PO TABS: 1000.0000 mg | ORAL_TABLET | Freq: Three times a day (TID) | ORAL | 0 refills | Status: AC

## 2017-08-17 MED ORDER — IBUPROFEN 400 MG PO TABS: 400.0000 mg | ORAL_TABLET | ORAL | 0 refills | Status: DC | PRN

## 2017-08-17 MED ORDER — TRAMADOL HCL 50 MG PO TABS: 50.0000 mg | ORAL_TABLET | Freq: Four times a day (QID) | ORAL | 0 refills | Status: DC | PRN

## 2017-08-17 MED ORDER — ACETAMINOPHEN 500 MG PO TABS: 500.0000 mg | ORAL_TABLET | Freq: Three times a day (TID) | ORAL | 0 refills | Status: DC | PRN

## 2017-08-17 NOTE — ED Triage Notes (Signed)
Pt reports painful blistering rash on R upper arm. Pt reports that she has had shingles in the past and the pain is similar. A&Ox4. Ambulatory,

## 2017-08-17 NOTE — ED Provider Notes (Signed)
[facility] DEPT Provider Note   CSN: [id number] Arrival date & time: 08/17/17  1344     History   Chief Complaint Chief Complaint  Patient presents with  . Rash    HPI Sarah Mata is a 76 y.o. female.  HPI   Wednesday noticed rash 10/10 pain Diagnosed with spider bite yesterday, given augmentin Taking baby aspirin and muscle relaxant without relief Rash spreading since yesterday Pain is severe like prior shingles It is located on the lateral side of her arm and extends to hand No fevers, no systemic symptoms, no vomiting     Past Medical History:  Diagnosis Date  . Allergy   . Arthritis   . Breast cancer ([place])   . Heart murmur   . Lupus     Patient Active Problem List   Diagnosis Date Noted  . Tremor of right hand 08/02/2017  . Chronic diarrhea 06/15/2017  . Vitamin D deficiency 05/21/2017  . 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) 03/28/2017    Past Surgical History:  Procedure Laterality Date  . BREAST LUMPECTOMY Right 2015  . Broken Nose     Patient fell.  Marland Kitchen FOOT SURGERY Right 2010    OB History    No data available       Home Medications    Prior to Admission medications   Medication Sig Start Date End Date Taking? Authorizing Provider  acetaminophen (TYLENOL) 500 MG tablet Take 1 tablet (500 mg total) by mouth every 8 (eight) hours as needed. 08/17/17   Gareth Morgan, MD  amoxicillin-clavulanate (AUGMENTIN) 875-125 MG tablet Take 1 tablet by mouth 2 (two) times daily. 08/16/17   Scot Jun, FNP  anastrozole (ARIMIDEX) 1 MG tablet Take 1 tablet (1 mg total) by mouth daily. 07/11/17   Truitt Merle, MD  Ascorbic Acid (VITAMIN C) 1000 MG tablet Take 1,000 mg by mouth daily.    [provider]  azelastine (ASTELIN) 0.1 % nasal spray  02/28/17   [provider]  b complex vitamins tablet Take 1 tablet by mouth daily.    [provider]  BIOTIN PO Take 500 mg by mouth daily.    [provider]    Calcium-Magnesium-Vitamin D (CALCIUM MAGNESIUM PO) Take 1 tablet by mouth daily.    [provider]  Cholecalciferol (VITAMIN D3) 3000 units TABS Take by mouth.    [provider]  Coenzyme Q10 (CO Q-10) 100 MG CAPS Take by mouth.    [provider]  cyclobenzaprine (FEXMID) 7.5 MG tablet Take 1 tablet (7.5 mg total) by mouth 2 (two) times daily. 08/16/17   Scot Jun, FNP  fluticasone Asencion Islam) 50 MCG/ACT nasal spray  02/28/17   [provider]  ibuprofen (ADVIL,MOTRIN) 400 MG tablet Take 1 tablet (400 mg total) by mouth every 4 (four) hours as needed. 08/17/17   Gareth Morgan, MD  Specialty Vitamins Products (MAGNESIUM, AMINO ACID CHELATE,) 133 MG tablet Take 1 tablet by mouth daily.     [provider]  traMADol (ULTRAM) 50 MG tablet Take 1 tablet (50 mg total) by mouth every 6 (six) hours as needed. 08/17/17   Gareth Morgan, MD  valACYclovir (VALTREX) 1000 MG tablet Take 1 tablet (1,000 mg total) by mouth 3 (three) times daily. 08/17/17 08/24/17  Gareth Morgan, MD    Family History Family History  Problem Relation Age of Onset  . Cancer Mother        breast cancer   . Cancer Sister 52  breast cancer   . Colon cancer Neg Hx   . Esophageal cancer Neg Hx   . Pancreatic cancer Neg Hx   . Rectal cancer Neg Hx   . Stomach cancer Neg Hx     Social History Social History  Substance Use Topics  . Smoking status: Never Smoker  . Smokeless tobacco: Never Used  . Alcohol use No     Allergies   Tetracyclines & related   Review of Systems Review of Systems  Constitutional: Negative for fever.  HENT: Negative for sore throat.   Eyes: Negative for visual disturbance.  Respiratory: Positive for cough. Negative for shortness of breath.   Cardiovascular: Negative for chest pain.  Gastrointestinal: Negative for abdominal pain, nausea and vomiting.  Genitourinary: Negative for difficulty urinating.  Musculoskeletal: Positive  for neck pain. Negative for back pain.  Skin: Positive for rash.  Neurological: Negative for syncope and headaches.     Physical Exam Updated Vital Signs BP 137/85 (BP Location: Left Arm)   Pulse 77   Temp 98.3 F (36.8 C) (Oral)   Resp 18   SpO2 95%   Physical Exam  Constitutional: She is oriented to person, place, and time. She appears well-developed and well-nourished. No distress.  HENT:  Head: Normocephalic and atraumatic.  Eyes: Conjunctivae and EOM are normal.  Neck: Normal range of motion.  Cardiovascular: Normal rate, regular rhythm and intact distal pulses.   Pulmonary/Chest: Effort normal. No respiratory distress.  Musculoskeletal: She exhibits no edema or tenderness.  Neurological: She is alert and oriented to person, place, and time.  Skin: Skin is warm and dry. No rash noted. She is not diaphoretic. There is erythema.  Vesicles with erythematous base scattered over lateral upper arm, dorsum of right hand  Nursing note and vitals reviewed.    ED Treatments / Results  Labs (all labs ordered are listed, but only abnormal results are displayed) Labs Reviewed - No data to display  EKG  EKG Interpretation None       Radiology No results found.  Procedures Procedures (including critical care time)  Medications Ordered in ED Medications - No data to display   Initial Impression / Assessment and Plan / ED Course  I have reviewed the triage vital signs and the nursing notes.  Pertinent labs & imaging results that were available during my care of the patient were reviewed by me and considered in my medical decision making (see chart for details).     76yo female with history of lupus, breast cancer presents with concern for rash and pain to right arm.  Rash appearance and dermatomal distribution consistent with shingles.  No sign of surrounding cellulitis at this time.  Given rx for tylenol, ibuprofen, valacyclovir, tramadol. Patient discharged in stable  condition with understanding of reasons to return.   Final Clinical Impressions(s) / ED Diagnoses   Final diagnoses:  Herpes zoster without complication    New Prescriptions Discharge Medication List as of 08/17/2017  4:02 PM    START taking these medications   Details  acetaminophen (TYLENOL) 500 MG tablet Take 1 tablet (500 mg total) by mouth every 8 (eight) hours as needed., Starting Sat 08/17/2017, Print    ibuprofen (ADVIL,MOTRIN) 400 MG tablet Take 1 tablet (400 mg total) by mouth every 4 (four) hours as needed., Starting Sat 08/17/2017, Print    traMADol (ULTRAM) 50 MG tablet Take 1 tablet (50 mg total) by mouth every 6 (six) hours as needed., Starting Sat 08/17/2017, Print  valACYclovir (VALTREX) 1000 MG tablet Take 1 tablet (1,000 mg total) by mouth 3 (three) times daily., Starting Sat 08/17/2017, Until Sat 08/24/2017, Print         Gareth Morgan, MD 08/18/17 [phone]

## 2017-08-17 NOTE — ED Notes (Signed)
MD made aware of patient.

## 2017-08-23 ENCOUNTER — Ambulatory Visit: Payer: Medicare Other | Admitting: Family Medicine

## 2017-08-27 DIAGNOSIS — B029 Zoster without complications: Secondary | ICD-10-CM | POA: Diagnosis not present

## 2017-08-27 DIAGNOSIS — Z23 Encounter for immunization: Secondary | ICD-10-CM | POA: Diagnosis not present

## 2017-08-27 DIAGNOSIS — B0229 Other postherpetic nervous system involvement: Secondary | ICD-10-CM | POA: Diagnosis not present

## 2017-09-04 NOTE — Progress Notes (Deleted)
Office Visit Note  Patient: Sarah Mata             Date of Birth: Apr 12, 1941           MRN: [id number]             PCP: Scot Jun, FNP Referring: Scot Jun, FNP Visit Date: 09/17/2017 Occupation: @GUAROCC @    Subjective:  No chief complaint on file.   History of Present Illness: Sarah Mata is a 76 y.o. female ***   Activities of Daily Living:  Patient reports morning stiffness for *** {minute/hour:19697}.   Patient {ACTIONS;DENIES/REPORTS:21021675::"Denies"} nocturnal pain.  Difficulty dressing/grooming: {ACTIONS;DENIES/REPORTS:21021675::"Denies"} Difficulty climbing stairs: {ACTIONS;DENIES/REPORTS:21021675::"Denies"} Difficulty getting out of chair: {ACTIONS;DENIES/REPORTS:21021675::"Denies"} Difficulty using hands for taps, buttons, cutlery, and/or writing: {ACTIONS;DENIES/REPORTS:21021675::"Denies"}   No Rheumatology ROS completed.   PMFS History:  Patient Active Problem List   Diagnosis Date Noted  . Tremor of right hand 08/02/2017  . Chronic diarrhea 06/15/2017  . Vitamin D deficiency 05/21/2017  . 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 Allergy   . Arthritis   . Breast cancer ([place])   . Heart murmur   . Lupus     Family History  Problem Relation Age of Onset  . Cancer Mother        breast cancer   . Cancer Sister 64       breast cancer   . Colon cancer Neg Hx   . Esophageal cancer Neg Hx   . Pancreatic cancer Neg Hx   . Rectal cancer Neg Hx   . Stomach cancer Neg Hx    Past Surgical History:  Procedure Laterality Date  . BREAST LUMPECTOMY Right 2015  . Broken Nose     Patient fell.  Marland Kitchen FOOT SURGERY Right 2010   Social History   Social History Narrative  . No narrative on file     Objective: Vital Signs: There were no vitals taken for this visit.   Physical Exam   Musculoskeletal Exam: ***  CDAI Exam: No CDAI exam completed.    Investigation: No  additional findings. CBC Latest Ref Rng & Units 08/16/2017 07/02/2017 06/26/2017  WBC 3.8 - 10.8 Thousand/uL 5.3 4.8 4.7  Hemoglobin 11.7 - 15.5 g/dL 14.0 14.3 14.0  Hematocrit 35.0 - 45.0 % 43.3 43.0 41.6  Platelets 140 - 400 Thousand/uL 167 172.0 172   CMP Latest Ref Rng & Units 07/02/2017 06/26/2017 06/07/2017  Glucose 70 - 99 mg/dL 99 98 84  BUN 6 - 23 mg/dL 11 13.1 13.0  Creatinine 0.40 - 1.20 mg/dL 0.83 1.0 0.9  Sodium 135 - 145 mEq/L 132(L) 134(L) 136  Potassium 3.5 - 5.1 mEq/L 5.0 4.7 4.9  Chloride 96 - 112 mEq/L 98 - -  CO2 19 - 32 mEq/L 30 28 26   Calcium 8.4 - 10.5 mg/dL 9.4 9.7 9.6  Total Protein 6.0 - 8.3 g/dL 6.8 7.1 7.4  Total Bilirubin 0.2 - 1.2 mg/dL 0.6 0.61 0.58  Alkaline Phos 39 - 117 U/L 123(H) 140 133  AST 0 - 37 U/L 117(H) 174(HH) 66(H)  ALT 0 - 35 U/L 193(H) 246(H) 114(H)    Imaging: No results found.  Speciality Comments: No specialty comments available.    Procedures:  No procedures performed Allergies: Tetracyclines & related   Assessment / Plan:     Visit Diagnoses: No diagnosis found.    Orders: No orders of the defined types were placed in this  encounter.  No orders of the defined types were placed in this encounter.   Face-to-face time spent with patient was *** minutes. 50% of time was spent in counseling and coordination of care.  Follow-Up Instructions: No Follow-up on file.   Earnestine Mealing, NT  Note - This record has been created using Editor, commissioning.  Chart creation errors have been sought, but may not always  have been located. Such creation errors do not reflect on  the standard of medical care.

## 2017-09-12 DIAGNOSIS — L723 Sebaceous cyst: Secondary | ICD-10-CM | POA: Diagnosis not present

## 2017-09-12 DIAGNOSIS — Z23 Encounter for immunization: Secondary | ICD-10-CM | POA: Diagnosis not present

## 2017-09-12 DIAGNOSIS — M79671 Pain in right foot: Secondary | ICD-10-CM | POA: Diagnosis not present

## 2017-09-12 DIAGNOSIS — B029 Zoster without complications: Secondary | ICD-10-CM | POA: Diagnosis not present

## 2017-09-12 DIAGNOSIS — B0229 Other postherpetic nervous system involvement: Secondary | ICD-10-CM | POA: Diagnosis not present

## 2017-09-16 ENCOUNTER — Ambulatory Visit (INDEPENDENT_AMBULATORY_CARE_PROVIDER_SITE_OTHER): Payer: Medicare Other | Admitting: Osteopathic Medicine

## 2017-09-16 ENCOUNTER — Encounter: Payer: Self-pay | Admitting: Osteopathic Medicine

## 2017-09-16 VITALS — BP 135/80 | HR 78 | Ht 66.5 in | Wt 200.0 lb

## 2017-09-16 DIAGNOSIS — R635 Abnormal weight gain: Secondary | ICD-10-CM | POA: Diagnosis not present

## 2017-09-16 DIAGNOSIS — R7989 Other specified abnormal findings of blood chemistry: Secondary | ICD-10-CM

## 2017-09-16 DIAGNOSIS — R251 Tremor, unspecified: Secondary | ICD-10-CM

## 2017-09-16 DIAGNOSIS — H6980 Other specified disorders of Eustachian tube, unspecified ear: Secondary | ICD-10-CM | POA: Insufficient documentation

## 2017-09-16 DIAGNOSIS — D8989 Other specified disorders involving the immune mechanism, not elsewhere classified: Secondary | ICD-10-CM

## 2017-09-16 DIAGNOSIS — I700 Atherosclerosis of aorta: Secondary | ICD-10-CM | POA: Insufficient documentation

## 2017-09-16 DIAGNOSIS — R740 Nonspecific elevation of levels of transaminase and lactic acid dehydrogenase [LDH]: Secondary | ICD-10-CM

## 2017-09-16 DIAGNOSIS — K529 Noninfective gastroenteritis and colitis, unspecified: Secondary | ICD-10-CM | POA: Diagnosis not present

## 2017-09-16 DIAGNOSIS — Z853 Personal history of malignant neoplasm of breast: Secondary | ICD-10-CM | POA: Diagnosis not present

## 2017-09-16 DIAGNOSIS — H905 Unspecified sensorineural hearing loss: Secondary | ICD-10-CM | POA: Insufficient documentation

## 2017-09-16 DIAGNOSIS — R7401 Elevation of levels of liver transaminase levels: Secondary | ICD-10-CM | POA: Insufficient documentation

## 2017-09-16 DIAGNOSIS — M329 Systemic lupus erythematosus, unspecified: Secondary | ICD-10-CM | POA: Insufficient documentation

## 2017-09-16 DIAGNOSIS — H903 Sensorineural hearing loss, bilateral: Secondary | ICD-10-CM | POA: Insufficient documentation

## 2017-09-16 DIAGNOSIS — Z23 Encounter for immunization: Secondary | ICD-10-CM | POA: Diagnosis not present

## 2017-09-16 NOTE — Progress Notes (Addendum)
HPI: Sarah Mata is a 76 y.o. female with PMH  has a past medical history of Allergy; Arthritis; Breast cancer ([place]); Heart murmur; and Lupus.  who presents to [facility] today, 09/16/17,  for chief complaint of:  Chief Complaint  Patient presents with  . Establish Care    dicsuss weight, chronic medical problems     Would like to discuss strategy for weight loss - she is unhappy with recent weight gain.   NEUROLOGICAL Concern for tremor past year or so, she has an upcoming appt w/ neurologist   GASTROINTESTINAL Hx chronic diarrhea, Last appt w/ GI at [facility] 07/02/17 w/ Dr Silverio Decamp: D+ improved w/ diet change, unable to recall last EGD/Colonoscopy. Dx possible IBS vs microscopic colitis Abn LFT - no strong suspicion for autoimmune hepatitis, following with GI,  CT abd/pelvis reviewed - concern to r/o liver mets, scan was ok  Recent colonoscopy   MUSCULOSKELETAL/RHEUM Last visit w/ Rheum at [facility] w/ Dr Estanislado Pandy 06/14/17: pt reported dx Lupus 1988, was on steroids for some time, offered/advised some other medication which was not taken. Reports fatigue and joint discomfort, labs at some point were c/w autoimmune disease. Repeat in 2018 (+)ANA, SSA, RNP. 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 Tremor of right hand 08/02/2017  . Chronic diarrhea 06/15/2017  . Vitamin D deficiency 05/21/2017  . Cancer of  right breast, stage 2 ([place]) 03/28/2017    Past Surgical History:  Procedure Laterality Date  . BREAST LUMPECTOMY Right 2015  . Broken Nose     Patient fell.  Marland Kitchen FOOT SURGERY Right 2010    Social History  Substance Use Topics  . Smoking status: Never Smoker  . Smokeless tobacco: Never Used  . Alcohol use No    Family History  Problem Relation Age of Onset  . Cancer Mother        breast cancer   . Cancer Sister 77       breast cancer   . Colon cancer Neg Hx   . Esophageal cancer Neg Hx   . Pancreatic cancer Neg Hx   . Rectal cancer Neg Hx   . Stomach cancer Neg Hx      Current medication list and allergy/intolerance information reviewed:    Current Outpatient Prescriptions  Medication Sig Dispense Refill  . anastrozole (ARIMIDEX) 1 MG tablet Take 1 tablet (1 mg total) by mouth daily. 30 tablet 3  . Ascorbic Acid (VITAMIN C) 1000 MG tablet Take 1,000 mg by mouth daily.    Marland Kitchen BIOTIN PO Take 500 mg by mouth daily.    . Cholecalciferol (VITAMIN D3) 3000 units TABS Take by mouth.    . Coenzyme Q10 (CO Q-10) 100 MG CAPS Take by mouth.    . fluticasone (FLONASE) 50 MCG/ACT nasal spray     . Specialty Vitamins Products (MAGNESIUM, AMINO ACID CHELATE,) 133 MG tablet Take 1 tablet by mouth daily.      No  current facility-administered medications for this visit.     Allergies  Allergen Reactions  . Tetracyclines & Related Other (See Comments)    Stomach burning      Review of Systems:  Constitutional:  No  fever, no chills, No recent illness, +unintentional weight gain. +significant fatigue.   HEENT: No  headache, no vision change, no hearing change, No sore throat, No  sinus pressure  Cardiac: No  chest pain, No  pressure, No palpitations  Respiratory:  No  shortness of breath. No  Cough  Gastrointestinal: No  abdominal pain, No  nausea, No  vomiting,  No  blood in stool, No  diarrhea, No  constipation   Musculoskeletal: No new myalgia/arthralgia  Skin: No   Rash  Hem/Onc: No  easy bruising/bleeding  Endocrine: No cold intolerance,  No heat intolerance. No polyuria/polydipsia/polyphagia   Neurologic: No  weakness, No  dizziness, No  slurred speech/focal weakness/facial droop  Psychiatric: No  concerns with depression, No  concerns with anxiety, +sleep problems, No mood problems  Exam:  BP 135/80   Pulse 78   Ht 5' 6.5" (1.689 m)   Wt 200 lb (90.7 kg)   BMI 31.80 kg/m   Constitutional: VS see above. General Appearance: alert, well-developed, well-nourished, NAD  Eyes: Normal lids and conjunctive, non-icteric sclera  Ears, Nose, Mouth, Throat: MMM, Normal external inspection ears/nares/mouth/lips/gums.  Neck: No masses, trachea midline. No thyroid enlargement. No tenderness/mass appreciated. No lymphadenopathy  Respiratory: Normal respiratory effort. no wheeze, no rhonchi, no rales  Cardiovascular: S1/S2 normal, no murmur, no rub/gallop auscultated. RRR. No lower extremity edema.   Gastrointestinal: Nontender, no masses. No hepatomegaly, no splenomegaly.   Musculoskeletal: Gait normal. No clubbing/cyanosis of digits.   Neurological: Normal balance/coordination.   Skin: warm, dry, intact. No rash/ulcer  Psychiatric: Normal judgment/insight. Normal mood and affect. Oriented x3.      ASSESSMENT/PLAN:   Unintended weight gain - discussed diet/exercise strategies to include high protein/fiber and good fats, reduce sugar.starch and unhelathy fats, weight-bearing/endurance/balance  - Plan: TSH  Chronic diarrhea - better, following with GI  Tremor of right hand - pt states don't worry about this, she has apt w/ neuro in place  History of invasive breast cancer - following with HemOnc  Autoimmune disorder ([place]) - Hx Lupus, following with Rheum at this point   Atherosclerosis of aorta ([place]) - noted on CT 2018  Transaminitis - Plan: CBC with Differential/Platelet, COMPLETE METABOLIC PANEL WITH GFR  Need for immunization  against influenza - Plan: Flu vaccine HIGH DOSE PF     ADDENDUM  Results for orders placed or performed in visit on 09/16/17 (from the past 48 hour(s))  CBC with Differential/Platelet     Status: None   Collection Time: 09/16/17  2:08 PM  Result Value Ref Range   WBC 6.7 3.8 - 10.8 Thousand/uL   RBC 4.57 3.80 - 5.10 Million/uL   Hemoglobin 13.9 11.7 - 15.5 g/dL   HCT 40.5 35.0 - 45.0 %   MCV 88.6 80.0 - 100.0 fL   MCH 30.4 27.0 - 33.0 pg   MCHC 34.3 32.0 - 36.0 g/dL   RDW 14.7 11.0 - 15.0 %   Platelets 175 140 - 400 Thousand/uL   MPV 11.3 7.5 - 12.5 fL   Neutro Abs 3,933 1,500 - 7,800 cells/uL   Lymphs Abs 1,702 850 - 3,900 cells/uL   WBC mixed population 784 200 - 950 cells/uL   Eosinophils Absolute 201 15 - 500 cells/uL   Basophils Absolute  80 0 - 200 cells/uL   Neutrophils Relative % 58.7 %   Total Lymphocyte 25.4 %   Monocytes Relative 11.7 %   Eosinophils Relative 3.0 %   Basophils Relative 1.2 %  TSH     Status: None   Collection Time: 09/16/17  2:08 PM  Result Value Ref Range   TSH 1.70 0.40 - 4.50 mIU/L  COMPLETE METABOLIC PANEL WITH GFR     Status: Abnormal   Collection Time: 09/16/17  2:08 PM  Result Value Ref Range   Glucose, Bld 144 (H) 65 - 139 mg/dL    Comment: .        Non-fasting reference interval .    BUN 16 7 - 25 mg/dL   Creat 1.10 (H) 0.60 - 0.93 mg/dL    Comment: For patients >56 years of age, the reference limit for Creatinine is approximately 13% higher for people identified as African-American. .    GFR, Est Non African American 50 (L) > OR = 60 mL/min/1.78m2   GFR, Est African American 58 (L) > OR = 60 mL/min/1.5m2   BUN/Creatinine Ratio 15 6 - 22 (calc)   Sodium 139 135 - 146 mmol/L   Potassium 4.1 3.5 - 5.3 mmol/L   Chloride 102 98 - 110 mmol/L   CO2 26 20 - 32 mmol/L   Calcium 9.3 8.6 - 10.4 mg/dL   Total Protein 6.8 6.1 - 8.1 g/dL   Albumin 3.9 3.6 - 5.1 g/dL   Globulin 2.9 1.9 - 3.7 g/dL (calc)   AG Ratio 1.3 1.0 - 2.5 (calc)    Total Bilirubin 0.4 0.2 - 1.2 mg/dL   Alkaline phosphatase (APISO) 117 33 - 130 U/L   AST 26 10 - 35 U/L   ALT 24 6 - 29 U/L   Please call patient: No concerns on labs.  Her liver enzymes are looking a lot better, back to normal range. Kidney function a bit slower than past measurements, would like to recheck this in another 6-8 weeks or so - orders are in, can go to lab directly     Visit summary with medication list and pertinent instructions was printed for patient to review. All questions at time of visit were answered - patient instructed to contact office with any additional concerns. ER/RTC precautions were reviewed with the patient. Follow-up plan: Return in about 3 months (around 12/17/2017) for recheck weight, sooner if needed. Annual when due. .  Note: Total time spent 45 minutes, greater than 50% of the visit was spent face-to-face counseling and coordinating care as well as record review for the following: The primary encounter diagnosis was Unintended weight gain. Diagnoses of Chronic diarrhea, Tremor of right hand, History of invasive breast cancer, Autoimmune disorder ([place]), Atherosclerosis of aorta ([place]), Transaminitis, and Need for immunization against influenza were also pertinent to this visit.Marland Kitchen  Please note: voice recognition software was used to produce this document, and typos may escape review. Please contact me for any needed clarifications.

## 2017-09-17 ENCOUNTER — Ambulatory Visit: Payer: Medicare Other | Admitting: Rheumatology

## 2017-09-17 LAB — CBC WITH DIFFERENTIAL/PLATELET
BASOS ABS: 80 {cells}/uL (ref 0–200)
BASOS PCT: 1.2 %
Eosinophils Absolute: 201 cells/uL (ref 15–500)
Eosinophils Relative: 3 %
HCT: 40.5 % (ref 35.0–45.0)
Hemoglobin: 13.9 g/dL (ref 11.7–15.5)
Lymphs Abs: 1702 cells/uL (ref 850–3900)
MCH: 30.4 pg (ref 27.0–33.0)
MCHC: 34.3 g/dL (ref 32.0–36.0)
MCV: 88.6 fL (ref 80.0–100.0)
MPV: 11.3 fL (ref 7.5–12.5)
Monocytes Relative: 11.7 %
NEUTROS PCT: 58.7 %
Neutro Abs: 3933 cells/uL (ref 1500–7800)
PLATELETS: 175 10*3/uL (ref 140–400)
RBC: 4.57 10*6/uL (ref 3.80–5.10)
RDW: 14.7 % (ref 11.0–15.0)
TOTAL LYMPHOCYTE: 25.4 %
WBC: 6.7 10*3/uL (ref 3.8–10.8)
WBCMIX: 784 {cells}/uL (ref 200–950)

## 2017-09-17 LAB — COMPLETE METABOLIC PANEL WITH GFR
AG Ratio: 1.3 (calc) (ref 1.0–2.5)
ALT: 24 U/L (ref 6–29)
AST: 26 U/L (ref 10–35)
Albumin: 3.9 g/dL (ref 3.6–5.1)
Alkaline phosphatase (APISO): 117 U/L (ref 33–130)
BUN / CREAT RATIO: 15 (calc) (ref 6–22)
BUN: 16 mg/dL (ref 7–25)
CALCIUM: 9.3 mg/dL (ref 8.6–10.4)
CHLORIDE: 102 mmol/L (ref 98–110)
CO2: 26 mmol/L (ref 20–32)
CREATININE: 1.1 mg/dL — AB (ref 0.60–0.93)
GFR, EST AFRICAN AMERICAN: 58 mL/min/{1.73_m2} — AB (ref >=60)
GFR, EST NON AFRICAN AMERICAN: 50 mL/min/{1.73_m2} — AB (ref >=60)
GLUCOSE: 144 mg/dL — AB (ref 65–139)
Globulin: 2.9 g/dL (calc) (ref 1.9–3.7)
POTASSIUM: 4.1 mmol/L (ref 3.5–5.3)
SODIUM: 139 mmol/L (ref 135–146)
TOTAL PROTEIN: 6.8 g/dL (ref 6.1–8.1)
Total Bilirubin: 0.4 mg/dL (ref 0.2–1.2)

## 2017-09-17 LAB — TSH: TSH: 1.7 m[IU]/L (ref 0.40–4.50)

## 2017-09-17 NOTE — Addendum Note (Signed)
Addended by: Maryla Morrow on: 09/17/2017 12:57 PM   Modules accepted: Orders

## 2017-09-26 NOTE — Progress Notes (Signed)
[place]  [phone] [phone]  Clinic Follow-up Note   Patient Care Team: Emeterio Reeve, DO as PCP - General (Osteopathic Medicine) Mauri Pole, MD as Consulting Physician (Gastroenterology) Bo Merino, MD as Consulting Physician (Rheumatology) 09/27/2017  CHIEF COMPLAINTS/PURPOSE OF CONSULTATION:  Follow up right breast cancer    Cancer of right breast, stage 2 ([place])   02/2014 Initial Biopsy    Right breast mass biopsy showed invasive ductal carcinoma grade 2, ER+/PR+/HER-2 negative      02/2014 Initial Diagnosis    Right breast cancer      03/2014 Surgery    Right lumpectomy with axillary lymph node sampling      03/2014 - 08/2014 Adjuvant Chemotherapy    4 cycles of Taxotere and Cytoxan with growth factor support with Neupogen      03/2014 Pathology Results    Right breast lumpectomy showed a 2.5 x 2 x 2 cm tumor with evidence of lymphovascular invasion, axillary lymph nodes were negative. Grade 3, Ki-67 20%.      03/2014 Miscellaneous    mammaprint reviewed high-risk luminal type      04/2014 -  Radiation Therapy    Patient underwent radiation to the right chest wall      08/2014 -  Anti-estrogen oral therapy    Letrozole 2.5 mg started in 08/2014 and due to poor tolerance switched to Anastrozole 1.0 mg, put on hold due to elevated liver enzyme since 05/21/2017 and restarted on 06/26/17      08/06/2017 Pathology Results    Diagnosis, colonoscopy 1. Surgical [P], right colon biopsies - COLONIC MUCOSA WITH BENIGN LYMPHOID AGGREGATES. - NO MICROSCOPIC COLITIS, ACTIVE INFLAMMATION, CHRONIC CHANGES OR GRANULOMAS. 2. Surgical [P], left colon biopsies - COLONIC MUCOSA WITH BENIGN LYMPHOID AGGREGATES. - NO MICROSCOPIC COLITIS, ACTIVE INFLAMMATION, CHRONIC CHANGES OR GRANULOMAS.      08/06/2017 Procedure    Colonoscopy by Dr. Silverio Decamp on 08/06/17 IMPRESSION - Diverticulosis in the sigmoid colon and in the  descending colon. - Non-bleeding internal hemorrhoids. - The entire examined colon is normal. Biopsied.         HISTORY OF PRESENTING ILLNESS: 05/21/17 Sarah Mata is a  76 y.o. female who has recently relocated from [place] to [place]. She has a known diagnosis of right-sided breast cancer. She presented with a palpable abnormality in the right breast which led to additional workup in March 2015. A core biopsy in April of 2015 showed an invasive ductal carcinoma grade 2, ER+/PR-/HER-2 negative. She later underwent a right lumpectomy with axillary lymph node sampling. The tumor characteristics notes a 2.5 x 2 x 2 cm tumor with evidence of lymphovascular invasion. The tumor was high-grade at grade 3 with Kli 67 index at 20%. She underwent 4 cycles of cytoxan and Taxotere with growth factor support with Neupogen which she finished in October of 2015. Post treatment, she underwent radiation therapy to the right chest wall and also was started on antiestrogen therapy with Letrozole 2.5 mg/day that she is complaint with.  She also suffers from history of anxiety.    Today, she presents to the clinic after moving to [place]. Besides hair loss and fatigue, she has no other problems with Letrozole. She has Lupus which she says might also cause her fatigue. Before moving to [place], she fell and broke her nose. She denies any other chronic conditions.   CURRENT THERAPY: Surveillance, Letrozole started in 08/2014 and due to poor tolerance switched to Anastrozole, put on hold due to  elevated liver enzyme since 05/21/2017 and restarted on 06/26/17    INTERVAL HISTORY:  Sarah Mata is here for a follow-up. She presents to the clinic today by herself.  She notes she is doing better overall. She is currently taking anastrozole and tolerating well now. She saw her GI Dr. Silverio Decamp and had colonoscopy which was good. She does not plan to see Dr. Silverio Decamp back soon. She was taking salt and it was staining her underwear yellow.  This was seen in her colonoscopy as well. She has stopped taking the salts. She notes having a twitching in her right hand. She thinks this is when she fell and will see a specialist this month. She has low pelvic floor strength and would like to see a PT. She overall is tired of exams and tests so she is not interested in bone density scan currently. She notes having a cyst in her scalp and wonders who she can go to for removal. She has gotten her flu shot. She previously had shingles on her right side.      MEDICAL HISTORY:  Past Medical History:  Diagnosis Date  . Allergy   . Arthritis   . Breast cancer ([place])   . Heart murmur   . Lupus   . Tremor of right hand 08/02/2017    SURGICAL HISTORY: Past Surgical History:  Procedure Laterality Date  . BREAST LUMPECTOMY Right 2015  . Broken Nose     Patient fell.  Marland Kitchen FOOT SURGERY Right 2010    SOCIAL HISTORY: Social History   Socioeconomic History  . Marital status: Widowed    Spouse name: Not on file  . Number of children: Not on file  . Years of education: Not on file  . Highest education level: Not on file  Social Needs  . Financial resource strain: Not on file  . Food insecurity - worry: Not on file  . Food insecurity - inability: Not on file  . Transportation needs - medical: Not on file  . Transportation needs - non-medical: Not on file  Occupational History  . Not on file  Tobacco Use  . Smoking status: Never Smoker  . Smokeless tobacco: Never Used  Substance and Sexual Activity  . Alcohol use: No  . Drug use: No  . Sexual activity: Not on file  Other Topics Concern  . Not on file  Social History Narrative  . Not on file    FAMILY HISTORY: Family History  Problem Relation Age of Onset  . Cancer Mother        breast cancer   . Cancer Sister 68       breast cancer   . Colon cancer Neg Hx   . Esophageal cancer Neg Hx   . Pancreatic cancer Neg Hx   . Rectal cancer Neg Hx   . Stomach cancer Neg Hx      ALLERGIES:  is allergic to tetracyclines & related.  MEDICATIONS:  Current Outpatient Medications  Medication Sig Dispense Refill  . anastrozole (ARIMIDEX) 1 MG tablet Take 1 tablet (1 mg total) daily by mouth. 90 tablet 3  . Ascorbic Acid (VITAMIN C) 1000 MG tablet Take 1,000 mg by mouth daily.    Marland Kitchen BIOTIN PO Take 500 mg by mouth daily.    . Cholecalciferol (VITAMIN D3) 3000 units TABS Take by mouth.    . Coenzyme Q10 (CO Q-10) 100 MG CAPS Take by mouth.    . fluticasone (FLONASE) 50 MCG/ACT nasal spray     .  Specialty Vitamins Products (MAGNESIUM, AMINO ACID CHELATE,) 133 MG tablet Take 1 tablet by mouth daily.      No current facility-administered medications for this visit.     REVIEW OF SYSTEMS:   Constitutional: Denies fevers, chills or abnormal night sweats Eyes: Denies blurriness of vision, double vision or watery eyes Ears, nose, mouth, throat, and face: Denies mucositis or sore throat Respiratory: Denies cough, dyspnea or wheezes Cardiovascular: Denies palpitation, chest discomfort or lower extremity swelling Gastrointestinal:  Denies nausea, heartburn or change in bowel habits  Skin: Denies abnormal skin rashes (+) cyst on her scalp Lymphatics: Denies new lymphadenopathy or easy bruising Neurological:Denies numbness, tingling or new weaknesses (+)shaking/twitching of right hand Behavioral/Psych: Mood is stable, no new changes  All other systems were reviewed with the patient and are negative.  PHYSICAL EXAMINATION:  ECOG PERFORMANCE STATUS: 0 - Asymptomatic  Vitals:   09/27/17 1331  BP: (!) 163/76  Pulse: 84  Resp: 18  Temp: 98.3 F (36.8 C)  SpO2: 100%   Filed Weights   09/27/17 1331  Weight: 198 lb 8 oz (90 kg)    GENERAL:alert, no distress and comfortable SKIN: skin color, texture, turgor are normal, no rashes or significant lesions  EYES: normal, conjunctiva are pink and non-injected, sclera clear OROPHARYNX:no exudate, no erythema and lips,  buccal mucosa, and tongue normal  NECK: supple, thyroid normal size, non-tender, without nodularity LYMPH:  no palpable lymphadenopathy in the cervical, axillary or inguinal LUNGS: clear to auscultation and percussion with normal breathing effort HEART: regular rate & rhythm and no murmurs and no lower extremity edema ABDOMEN:abdomen soft, non-tender and normal bowel sounds, no hepatomegaly  Musculoskeletal:no cyanosis of digits and no clubbing  PSYCH: alert & oriented x 3 with fluent speech NEURO: no focal motor/sensory deficits Breasts: Breast inspection showed them to be symmetrical with no nipple discharge (+) s/p . Surgical scar in the right breast and axilla has healed well. Palpation of the breasts and axilla revealed no obvious mass that I could appreciate, except surgical scar and a 1cm mass above the surgical scar of right breast, at 8:30 position (+) left breast s/p breast reduction: no palpable mass or adenopathy.    LABORATORY DATA:  I have reviewed the data as listed CBC Latest Ref Rng & Units 09/16/2017 08/16/2017 07/02/2017  WBC 3.8 - 10.8 Thousand/uL 6.7 5.3 4.8  Hemoglobin 11.7 - 15.5 g/dL 13.9 14.0 14.3  Hematocrit 35.0 - 45.0 % 40.5 43.3 43.0  Platelets 140 - 400 Thousand/uL 175 167 172.0   CMP Latest Ref Rng & Units 09/16/2017 07/02/2017 06/26/2017  Glucose 65 - 139 mg/dL 144(H) 99 98  BUN 7 - 25 mg/dL 16 11 13.1  Creatinine 0.60 - 0.93 mg/dL 1.10(H) 0.83 1.0  Sodium 135 - 146 mmol/L 139 132(L) 134(L)  Potassium 3.5 - 5.3 mmol/L 4.1 5.0 4.7  Chloride 98 - 110 mmol/L 102 98 -  CO2 20 - 32 mmol/L '26 30 28  '$ Calcium 8.6 - 10.4 mg/dL 9.3 9.4 9.7  Total Protein 6.1 - 8.1 g/dL 6.8 6.8 7.1  Total Bilirubin 0.2 - 1.2 mg/dL 0.4 0.6 0.61  Alkaline Phos 39 - 117 U/L - 123(H) 140  AST 10 - 35 U/L 26 117(H) 174(HH)  ALT 6 - 29 U/L 24 193(H) 246(H)   PROCEDURE  Colonoscopy by Dr. Silverio Decamp on 08/06/17 IMPRESSION - Diverticulosis in the sigmoid colon and in the descending  colon. - Non-bleeding internal hemorrhoids. - The entire examined colon is normal. Biopsied.  RADIOGRAPHIC  STUDIES: I have personally reviewed the radiological images as listed and agreed with the findings in the report. No results found.    ASSESSMENT & PLAN: 76 y.o. woman   1. 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  -She is status post lumpectomy, adjuvant chemotherapy and irradiation, currently on adjuvant letrozole, tolerating well overall. -We discussed the risk of cancer recurrence. She has high risk for recurrence based on the mammaprint result  - last mammogram was Nov 30, 2016, I will try to get a copy  -I recommend her to continue adjuvant antiestrogen therapy. Due to shaking of right hand, she is very concerned this could be side effect of the letrozole. I recommended her to switch to Anastrozole '1mg'$  to take once daily.  Anastrozole was temporarily held due to her abnormal liver functions. -due to her persistently elevated liver enzymes, I obtain a CT Abdomen which was negative for metastasis.   -She was evaluated by GI and colonoscopy was overall normal.  After GI evaluation, she has restarted anastrozole -We reviewed her labs from 09/16/17 which showed her liver enzymes has became normal, and kidney function tests have improved.  We will continue anastrozole. -I suggested she get a bone density scan, she has deferred for now -I suggest PT for her pelvic floor strengthening, I will send referral when she is ready for it.  -She is eligible for shingles shot in 02-11-2018 as she will be 3 years out since her chemotherapy.  -I will schedule her mammogram in 10/2017, her last mammogram was at [facility] in [place] done by Dr. Chancy Milroy.  -Lab in 12/2016 for her LFTs -f/u in 6 months    2. Transaminates -this was discovered on routine lab work on  05/21/2017 -CT abdomen was negative  -Her husband had chronic hepatitis, and passed away in 2011-02-12, I recommend her to check for hepB and C -autoimmune hepatitis is also possible due to her lupus.  -she had liver work up by Dr.  Silverio Decamp -She had a normal CT and colonoscopy  -Her liver function as of 09/16/17 labs have much improved.    3. Bone health -I discussed the potential osteopenia and osteoporosis from aromatase inhibitor -She is on calcium and vitamin D -She is due for bone density scan, she has deferred for now.   4. Lupus -She will continue f/u with Dr. Estanislado Pandy    PLAN -continue anstrozole -Lab and f/u in 6 months  -Mammogram in 1-2 month -Lab in 3 months to monitor her liver function    Orders Placed This Encounter  Procedures  . US BREAST LTD UNI RIGHT INC AXILLA    Standing Status:   Future    Standing Expiration Date:   11/27/2018    Scheduling Instructions:     Pt previously had mammograms at [facility], under dr. Humphrey Rolls    Order Specific Question:   Reason for Exam (SYMPTOM  OR DIAGNOSIS REQUIRED)    Answer:   screening    Order Specific Question:   Preferred imaging location?    Answer:   [facility]    All questions were answered. The patient knows to call the clinic with any problems, questions or concerns.  I spent 25 minutes counseling the patient face to face. The total time spent in the appointment was 30 minutes and more than 50% was on counseling.  This document serves as a  record of services personally performed by Truitt Merle, MD. It was created on her behalf by Joslyn Devon, a trained medical scribe. The creation of this record is based on the scribe's personal observations and the provider's statements to them.     I have reviewed the above documentation for accuracy and completeness, and I agree with the above.     Truitt Merle, MD 09/27/2017

## 2017-09-27 ENCOUNTER — Other Ambulatory Visit: Payer: Self-pay | Admitting: Hematology

## 2017-09-27 ENCOUNTER — Ambulatory Visit (HOSPITAL_BASED_OUTPATIENT_CLINIC_OR_DEPARTMENT_OTHER): Payer: Medicare Other | Admitting: Hematology

## 2017-09-27 ENCOUNTER — Other Ambulatory Visit: Payer: Medicare Other

## 2017-09-27 ENCOUNTER — Telehealth: Payer: Self-pay | Admitting: Hematology

## 2017-09-27 VITALS — BP 163/76 | HR 84 | Temp 98.3°F | Resp 18 | Ht 66.5 in | Wt 198.5 lb

## 2017-09-27 DIAGNOSIS — C50911 Malignant neoplasm of unspecified site of right female breast: Secondary | ICD-10-CM

## 2017-09-27 DIAGNOSIS — M329 Systemic lupus erythematosus, unspecified: Secondary | ICD-10-CM

## 2017-09-27 DIAGNOSIS — Z170 Estrogen receptor positive status [ER+]: Secondary | ICD-10-CM | POA: Diagnosis not present

## 2017-09-27 DIAGNOSIS — Z79811 Long term (current) use of aromatase inhibitors: Secondary | ICD-10-CM

## 2017-09-27 MED ORDER — ANASTROZOLE 1 MG PO TABS: 1.0000 mg | ORAL_TABLET | Freq: Every day | ORAL | 3 refills | Status: DC

## 2017-09-27 NOTE — Telephone Encounter (Signed)
Gave avs and calendar for January and February 2019 

## 2017-09-28 ENCOUNTER — Encounter: Payer: Self-pay | Admitting: Hematology

## 2017-10-02 ENCOUNTER — Encounter: Payer: Self-pay | Admitting: Neurology

## 2017-10-02 ENCOUNTER — Ambulatory Visit (INDEPENDENT_AMBULATORY_CARE_PROVIDER_SITE_OTHER): Payer: Medicare Other | Admitting: Neurology

## 2017-10-02 VITALS — BP 142/88 | HR 64 | Ht 66.0 in | Wt 200.0 lb

## 2017-10-02 DIAGNOSIS — G20 Parkinson's disease: Secondary | ICD-10-CM | POA: Diagnosis not present

## 2017-10-02 DIAGNOSIS — R251 Tremor, unspecified: Secondary | ICD-10-CM | POA: Diagnosis not present

## 2017-10-02 DIAGNOSIS — G20C Parkinsonism, unspecified: Secondary | ICD-10-CM

## 2017-10-02 NOTE — Patient Instructions (Addendum)
You have a right hand tremor. You also have milder changes with fine motor dyscontrol on the right. Overall findings are mild or even subtle. Nevertheless, some of your symptoms and physical findings could point towards parkinsonism, early right-sided Parkinson's disease.  As explained, there are medication treatment options, but this disease does progress with time. It can affect your balance, your memory, your mood, your bowel and bladder function, your posture, balance and walking and your activities of daily living. However, there are good supportive treatments and symptomatic treatments available, so most patients have a change to a good quality life and life expectancy is not typically altered. Overall you are doing fairly healthwise, but I do want to suggest a few things today:  Remember to drink plenty of fluid at least 6 glasses (8 oz each), eat healthy meals and do not skip any meals. Try to eat protein with a every meal and eat a healthy snack such as fruit or nuts in between meals. Try to keep a regular sleep-wake schedule and try to exercise daily, particularly in the form of walking, 20-30 minutes a day, if you can.   Try to stay active physically and mentally. Engage in social activities in your community and with your family and try to keep up with current events by reading the newspaper or watching the news. Try to do word puzzles and you may like to do puzzles and brain games on the computer such as on [email or web address]   As far as your medications are concerned, we will wait for now.   As far as diagnostic testing, I will order: a brain MRI.   I would like to see you back in 6 months, sooner if we need to. Please call us with any interim questions, concerns, problems, updates or refill requests.  Our phone number is [phone]. We also have an after hours call service for urgent matters and there is a physician on-call for urgent questions, that cannot wait till the next work day. For  any emergencies you know to call 911 or go to the nearest emergency room.

## 2017-10-02 NOTE — Progress Notes (Signed)
Subjective:    Patient ID: Sarah Mata is a 76 y.o. female.  HPI     Star Age, MD, PhD [facility] [address] [place], [place] [address]  Dear Sarah Mata,   I saw your patient, Sarah Mata, upon your kind request in my neurologic clinic today for initial consultation of her right hand tremor. The patient is unaccompanied today. As you know, Sarah Mata is a 76 year old right-handed woman with an underlying medical history of breast cancer status post lumpectomy and chemoradiation, now on Arimidex, lupus, arthritis, allergies, and obesity, who reports a several month history of right hand tremor. I reviewed your office note from 08/01/2017.  She noticed it primarily when she drove back from [place] this past April. She is widowed and lives alone. She has no children. She has no family history of essential tremor or Parkinson's disease. Lived in [place] some, some in [place], some in [place]. She worked at [employer]. She has a sister in [place]. She has a brother in [place]. She has a sister, who lives in [place].  She had a fall in 2016, tripped over a cement block on the parking lot, fell forward onto R shoulder, injured her nose. Had Marlette in [place] then, neg per her report.  She owns a condo in [place] now. She drives without difficult. She sleeps okay.  She tries to eat right.  Tries to hydrate well. She does not exercise on a regular basis, has a Building services engineer. She had a recent bout of shingles affecting her right proximal upper extremity. She has not noticed much in the way of fine motor difficulties. She feels that her handwriting is normal. She has not had an eye check in several years. She feels that sometimes her eye movements are not quite normal. She has not noticed any specific problem with reading. She denies any sleep difficulties as an snoring or waking up gasping. She does not have a history telltale for REM behavior disorder but years ago she  had a few nightmares she recalls. She also had an injury to her right ankle and has hardware in place. She reports no problem with her memory. She reports stress and worrying over finances.  Her Past Medical History Is Significant For: Past Medical History:  Diagnosis Date  . Allergy   . Arthritis   . Breast cancer ([facility])   . Heart murmur   . Lupus   . Tremor of right hand 08/02/2017    Her Past Surgical History Is Significant For: Past Surgical History:  Procedure Laterality Date  . BREAST LUMPECTOMY Right 2015  . Broken Nose     Patient fell.  Marland Kitchen FOOT SURGERY Right 2010    Her Family History Is Significant For: Family History  Problem Relation Age of Onset  . Cancer Mother        breast cancer   . Cancer Sister 59       breast cancer   . Colon cancer Neg Hx   . Esophageal cancer Neg Hx   . Pancreatic cancer Neg Hx   . Rectal cancer Neg Hx   . Stomach cancer Neg Hx     Her Social History Is Significant For: Social History   Socioeconomic History  . Marital status: Widowed    Spouse name: None  . Number of children: None  . Years of education: None  . Highest education level: None  Social Needs  . Financial resource strain: None  .  Food insecurity - worry: None  . Food insecurity - inability: None  . Transportation needs - medical: None  . Transportation needs - non-medical: None  Occupational History  . None  Tobacco Use  . Smoking status: Never Smoker  . Smokeless tobacco: Never Used  Substance and Sexual Activity  . Alcohol use: No  . Drug use: No  . Sexual activity: None  Other Topics Concern  . None  Social History Narrative  . None    Her Allergies Are:  Allergies  Allergen Reactions  . Tetracyclines & Related Other (See Comments)    Stomach burning  :   Her Current Medications Are:  Outpatient Encounter Medications as of 10/02/2017  Medication Sig  . anastrozole (ARIMIDEX) 1 MG tablet Take 1 tablet (1 mg total) daily by mouth.  .  Ascorbic Acid (VITAMIN C) 1000 MG tablet Take 1,000 mg by mouth daily.  Marland Kitchen BIOTIN PO Take 500 mg by mouth daily.  . Cholecalciferol (VITAMIN D3) 3000 units TABS Take by mouth.  . Coenzyme Q10 (CO Q-10) 100 MG CAPS Take by mouth.  . fluticasone (FLONASE) 50 MCG/ACT nasal spray   . Specialty Vitamins Products (MAGNESIUM, AMINO ACID CHELATE,) 133 MG tablet Take 1 tablet by mouth daily.    No facility-administered encounter medications on file as of 10/02/2017.   : Review of Systems:  Out of a complete 14 point review of systems, all are reviewed and negative with the exception of these symptoms as listed below: Review of Systems  Neurological: Positive for tremors.       Pt has noticed a tremor in her right hand since March or April of this year after she drove here from [place]. Pt does not notice the tremor when she is sleeping.    Objective:  Neurological Exam  Physical Exam Physical Examination:   Vitals:   10/02/17 1434  BP: (!) 142/88  Pulse: 64    General Examination: The patient is a very pleasant 76 y.o. female in no acute distress. She appears well-developed and well-nourished and well groomed. She appears anxious.   HEENT: Normocephalic, atraumatic, pupils are equal, round and reactive to light and accommodation. She has mild bilateral cataracts. Extraocular tracking is fairly good. She has no difficulty with smooth pursuit. Hearing is grossly intact. Face is symmetric with normal facial sensation but perhaps slight facial masking. Speech is normal. No dysarthria, no sialorrhea. Airway examination is benign. She has no head or neck tremor, no lip tremor. She may have a very subtle degree of nuchal rigidity but otherwise full range of motion.   Chest: Clear to auscultation without wheezing, rhonchi or crackles noted.  Heart: S1+S2+0, regular and normal without murmurs, rubs or gallops noted.   Abdomen: Soft, non-tender and non-distended with normal bowel sounds appreciated  on auscultation.  Extremities: There is no pitting edema in the distal lower extremities bilaterally, but right ankle is larger than left.  Skin: Warm and dry without trophic changes noted. she has healing scars from shingles rash in the proximal right upper extremity.   Musculoskeletal: exam reveals no obvious joint deformities, tenderness or joint swelling or erythema.   Neurologically:  Mental status: The patient is awake, alert and oriented in all 4 spheres. Her immediate and remote memory, attention, language skills and fund of knowledge are appropriate. There is no evidence of aphasia, agnosia, apraxia or anomia. Speech is clear with normal prosody and enunciation. Thought process is linear. Mood is normal and affect is normal.  On 10/02/2017: On Archimedes spiral drawing she has no significant trembling. Handwriting is not tremulous but somewhat smallish.  Cranial nerves II - XII are as described above under HEENT exam. In addition: shoulder shrug is normal with equal shoulder height noted. Motor exam: Normal bulk, strength and tone is noted with the exception of slight increase in tone in the right upper extremity only. There is no drift, or rebound. Romberg is negative. She has a mild resting tremor in the right upper extremity. She has no other tremors. She has no significant postural or action tremor. On fine motor testing she has maybe subtle difficulties with finger taps and foot taps on the right, could be a normal as well. Left side appears normal. Reflexes are 3+ throughout. Cerebellar testing: No dysmetria or intention tremor on finger to nose testing. Heel to shin is unremarkable bilaterally. There is no truncal or gait ataxia.  Sensory exam: intact to light touch in the upper and lower extremities.  Gait, station and balance: She stands easily. No veering to one side is noted. No leaning to one side is noted. Posture is age-appropriate and stance is narrow based. Gait shows normal  stride length and normal pace, slight decrease in R arm swing.   Assessment and Plan:  In summary, Lyndi Holbein is a very pleasant 76 y.o.-year old female with an underlying medical history of breast cancer status post lumpectomy and chemoradiation, now on Arimidex, lupus, arthritis, allergies, and obesity, who presents for initial consultation of her right hand tremor. On examination she has a resting tremor of the right hand, on motor examination she has very subtle fine motor difficulties on the right, very mild decrease in arm swing noted on the right. 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 I think it is too early to commit to diagnosis of PD. 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 I would like to proceed with a brain MRI without contrast, we mutually agreed not to initiate any medications as yet but we did talk about parkinsonism quite a bit today. I had a long discussion with the patient and her multiple questions were answered. We will call her with her MRI results. I would like to follow her clinically. I suggested a 6 month follow-up, sooner as needed. She is advised to maintain a healthy lifestyle. I encouraged the patient to eat healthy, exercise daily and keep well hydrated, to keep a scheduled bedtime and wake time routine, to not skip any meals and eat healthy snacks in between meals. I advised her to increase her exercise regimen. I answered all her questions today and the patient was in agreement. Thank you very much for allowing me to participate in the care of this nice patient. If I can be of any further assistance to you please do not hesitate to call me at [phone].  Sincerely,   Star Age, MD, PhD

## 2017-10-07 ENCOUNTER — Telehealth: Payer: Self-pay | Admitting: Neurology

## 2017-10-07 NOTE — Telephone Encounter (Signed)
I spoke with Dr. Rexene Alberts. She recommends that pt bring [place] the form from the [facility] requires and Dr. Rexene Alberts can fill it out and sign it. I called pt. Pt is agreeable to this and will bring the form by our office.

## 2017-10-07 NOTE — Telephone Encounter (Signed)
Pt is asking for a note of permission from Dr Rexene Alberts for [facility] to be [facility]  Pt is asking that it be faxed to the attention of Wellness Director [facility], Pt is asking to be called on [phone] if there are questions re: this request

## 2017-10-15 ENCOUNTER — Telehealth: Payer: Self-pay | Admitting: Neurology

## 2017-10-15 NOTE — Telephone Encounter (Signed)
Please explain to patient that I do not feel comfortable prescribing Xanax if she is not able to have a driver for her MRI appt. Taking Xanax can affect your driving and vigilance, especially if this is a new medication to you and you don't know how it may affect your driving and how long the effect may last.

## 2017-10-15 NOTE — Telephone Encounter (Signed)
Pt dropped off the form and Dr. Rexene Alberts signed the completed form allowing for her to participate in the cycling class. Form given back to pt.

## 2017-10-15 NOTE — Telephone Encounter (Signed)
I spoke with Dr. Rexene Alberts. She will not prescribe xanax if pt does not agree to have a driver.  I called pt to discuss. No answer, left a message asking her to call me back.

## 2017-10-15 NOTE — Telephone Encounter (Signed)
Patient is schedule to have her MRI Brain done on 10/26/17 at [facility] on their largest unit. The patient informed me that she is very claustrophobic and would like something to calm her nerves. I informed her if she takes it she will have to have a driver she states she won't be able to have a driver and that she will just wait around until the medicine has worn off. Please advise.

## 2017-10-16 ENCOUNTER — Telehealth: Payer: Self-pay | Admitting: *Deleted

## 2017-10-16 NOTE — Telephone Encounter (Signed)
Received call from pt stating that she wants to come off anastrozole b/c she is having signs of tremors & confusion.  Returned call [phone] & left message for pt to return call & asked if she would like to try another drug-aromasin, tamoxifen as recommended by Dr Burr Medico.

## 2017-10-17 ENCOUNTER — Telehealth: Payer: Self-pay | Admitting: Neurology

## 2017-10-17 NOTE — Telephone Encounter (Signed)
Please see other phone note on this issue.

## 2017-10-17 NOTE — Telephone Encounter (Signed)
Pt had called back, said she wanted to do the MRI when she spoke to our phone staff. I have called pt back again to discuss. No answer, left a message asking her to call me back.

## 2017-10-17 NOTE — Telephone Encounter (Signed)
I called pt again to discuss. No answer, left a message asking her to call me back. 

## 2017-10-17 NOTE — Telephone Encounter (Signed)
Pt called she is wanting to have the MRI. Please call to discuss

## 2017-10-18 DIAGNOSIS — H25813 Combined forms of age-related cataract, bilateral: Secondary | ICD-10-CM | POA: Diagnosis not present

## 2017-10-18 DIAGNOSIS — H04123 Dry eye syndrome of bilateral lacrimal glands: Secondary | ICD-10-CM | POA: Diagnosis not present

## 2017-10-21 NOTE — Telephone Encounter (Signed)
I called pt again to discuss her MRI and xanax. No answer, left a message asking her to call me back.

## 2017-10-22 ENCOUNTER — Ambulatory Visit (INDEPENDENT_AMBULATORY_CARE_PROVIDER_SITE_OTHER): Payer: Medicare Other | Admitting: Osteopathic Medicine

## 2017-10-22 ENCOUNTER — Encounter: Payer: Self-pay | Admitting: Osteopathic Medicine

## 2017-10-22 VITALS — BP 147/70 | HR 86 | Temp 98.5°F | Wt 199.0 lb

## 2017-10-22 DIAGNOSIS — J069 Acute upper respiratory infection, unspecified: Secondary | ICD-10-CM

## 2017-10-22 DIAGNOSIS — R251 Tremor, unspecified: Secondary | ICD-10-CM

## 2017-10-22 DIAGNOSIS — B9789 Other viral agents as the cause of diseases classified elsewhere: Secondary | ICD-10-CM | POA: Diagnosis not present

## 2017-10-22 MED ORDER — AZITHROMYCIN 250 MG PO TABS
ORAL_TABLET | ORAL | 0 refills | Status: DC
Start: 2017-10-22 — End: 2018-01-06

## 2017-10-22 MED ORDER — IPRATROPIUM BROMIDE 0.06 % NA SOLN: 2.0000 | Freq: Four times a day (QID) | NASAL | 12 refills | Status: DC

## 2017-10-22 MED ORDER — ALPRAZOLAM 0.5 MG PO TABS: ORAL_TABLET | ORAL | 0 refills | Status: DC

## 2017-10-22 NOTE — Telephone Encounter (Signed)
Patient called back stating that she arranged for transportation to her MRI and would like something strong called in to [facility].

## 2017-10-22 NOTE — Telephone Encounter (Signed)
Received verbal order from Dr. Rexene Alberts to do xanax 0.5mg  2 tablets for pt's MRI. Order placed.

## 2017-10-22 NOTE — Telephone Encounter (Signed)
Patient called b/c she is at the pharmacy now and they don't have the Rx. I made her aware that the nurses were in a meeting and that it would be faxed just as soon as the nurse had a chance. She voiced understanding.

## 2017-10-22 NOTE — Patient Instructions (Signed)
Over-the-Counter Medications & Home Remedies for Upper Respiratory Illness  Note: the following list assumes no pregnancy, normal liver & kidney function and no other drug interactions. Dr. Sheppard Coil has highlighted medications which are safe for you to use, but these may not be appropriate for everyone. Always ask a pharmacist or qualified medical provider if you have any questions!   Aches/Pains, Fever, Headache Acetaminophen (Tylenol) 500 mg tablets - take max 2 tablets (1000 mg) every 6 hours (4 times per day)  Ibuprofen (Motrin) 200 mg tablets - take max 4 tablets (800 mg) every 6 hours*  Sinus Congestion Prescription Atrovent as directed Nasal Saline if desired Phenylephrine (Sudafed) 10 mg tablets every 4 hours (or the 12-hour formulation)* Diphenhydramine (Benadryl) 25 mg tablets - take max 2 tablets every 4 hours  Cough & Sore Throat Prescription cough pills or syrups as directed Dextromethorphan (Robitussin, others) - cough suppressant Guaifenesin (Robitussin, Mucinex, others) - expectorant (helps cough up mucus) (Dextromethorphan and Guaifenesin also come in a combination tablet) Lozenges w/ Benzocaine + Menthol (Cepacol) Honey - as much as you want! Teas which "coat the throat" - look for ingredients Elm Bark, Licorice Root, Marshmallow Root  Other Antibiotics if these are needed - take ALL, even if you're feeling better  Zinc Lozenges within 24 hours of symptoms onset - mixed evidence this shortens the duration of the common cold Don't waste your money on Vitamin C or Echinacea  *Caution in patients with high blood pressure

## 2017-10-22 NOTE — Telephone Encounter (Signed)
I called pt, left a detailed message on her cell phone, per DPR, advising her that xanax 0.5 mg was sent to [facility] and that she may take 1 tablet 30 mins prior to MRI and 1 additional tablet as needed immediately prior to entering scanner but pt must have a driver to and from MRI appt and to call me back with questions. RX for xanax faxed to [facility]. Received a receipt of confirmation.

## 2017-10-22 NOTE — Progress Notes (Signed)
HPI: Sarah Mata is a 76 y.o. female who  has a past medical history of Allergy, Arthritis, Breast cancer ([facility]), Heart murmur, Lupus, and Tremor of right hand (08/02/2017).  she presents to [facility] today, 10/22/17,  for chief complaint of:  Chief Complaint  Patient presents with  . Nasal Congestion    yellow mucus    Sick: congestion, yellow mucus, no OTC meds tried, going on for about 3 days. No fever at home. Sinus congestion is greatest problem. She doesn't do well with Rx cough meds.   BP a little high today on intake. High at another visit 09/27/17 per vitals review. Better on recheck.   Tremor: following with neurology, concern for possible parkinsonism. She has questions about how to afford long term care if needed     Past medical history, surgical history, social history and family history reviewed.  Patient Active Problem List   Diagnosis Date Noted  . 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 Eustachian tube dysfunction 09/16/2017  . Atherosclerosis of aorta ([facility]) 09/16/2017  . History of invasive breast cancer 09/16/2017  . Autoimmune disorder ([facility]) 09/16/2017  . Unintended weight gain 09/16/2017  . Transaminitis 09/16/2017  . Tremor of right hand 08/02/2017  . Chronic diarrhea 06/15/2017  . Vitamin D deficiency 05/21/2017  . Cancer of right breast, stage 2 ([place]) 03/28/2017    Current medication list and allergy/intolerance information reviewed.    Current Outpatient Medications on File Prior to Visit  Medication Sig Dispense Refill  . anastrozole (ARIMIDEX) 1 MG tablet Take 1 tablet (1 mg total) daily by mouth. 90 tablet 3  . Ascorbic Acid (VITAMIN C) 1000 MG tablet Take 1,000 mg by mouth daily.    Marland Kitchen BIOTIN PO Take 500 mg by mouth daily.    . Cholecalciferol (VITAMIN D3) 3000 units TABS Take by mouth.    . Coenzyme Q10 (CO Q-10) 100 MG CAPS Take by mouth.    . fluticasone (FLONASE) 50 MCG/ACT nasal spray      . Specialty Vitamins Products (MAGNESIUM, AMINO ACID CHELATE,) 133 MG tablet Take 1 tablet by mouth daily.      No current facility-administered medications on file prior to visit.    Allergies  Allergen Reactions  . Tetracyclines & Related Other (See Comments)    Stomach burning      Review of Systems:  Constitutional: +recent illness  HEENT: +sinus pressure without other headache, no vision change  Cardiac: No  chest pain, No  pressure, No palpitations  Respiratory:  No  shortness of breath. +Cough  Gastrointestinal: No  abdominal pain, no change on bowel habits  Musculoskeletal: No new myalgia/arthralgia  Skin: No  Rash  Neurologic: No  weakness, No  Dizziness   Exam:  BP 135/85   Pulse 86   Temp 98.5 F (36.9 C)   Wt 199 lb (90.3 kg)   BMI 32.12 kg/m   Constitutional: VS see above. General Appearance: alert, well-developed, well-nourished, NAD  Eyes: Normal lids and conjunctive, non-icteric sclera  Ears, Nose, Mouth, Throat: MMM, Normal external inspection ears/nares/mouth/lips/gums. TM normal bilaterally. Normal pharynx.   Neck: No masses, trachea midline.   Respiratory: Normal respiratory effort. no wheeze, no rhonchi, no rales  Cardiovascular: S1/S2 normal, no murmur, no rub/gallop auscultated. RRR.   Musculoskeletal: Gait normal. Symmetric and independent movement of all extremities  Neurological: Normal balance/coordination. +tremor.  Skin: warm, dry, intact.   Psychiatric: Normal judgment/insight. Normal mood and affect. Oriented x3.  ASSESSMENT/PLAN:   Viral URI with cough - OTC meds reviewed, trial atrovent, fill abx if no better 7-10 days   Tremor of right hand - will try to connect to social work if needed, she is not at the point of needing much asistanct yet. We discussed advance directives.    Meds ordered this encounter  Medications  . ipratropium (ATROVENT) 0.06 % nasal spray    Sig: Place 2 sprays into both nostrils  4 (four) times daily.    Dispense:  15 mL    Refill:  12  . azithromycin (ZITHROMAX) 250 MG tablet    Sig: 2 tabs po x1 on Day 1, then 1 tab po daily on Days 2 - 5. Fill if no better 7-10 days. Expires 11/05/17    Dispense:  6 tablet    Refill:  0      Patient Instructions  Over-the-Counter Medications & Home Remedies for Upper Respiratory Illness  Note: the following list assumes no pregnancy, normal liver & kidney function and no other drug interactions. Dr. Sheppard Coil has highlighted medications which are safe for you to use, but these may not be appropriate for everyone. Always ask a pharmacist or qualified medical provider if you have any questions!   Aches/Pains, Fever, Headache Acetaminophen (Tylenol) 500 mg tablets - take max 2 tablets (1000 mg) every 6 hours (4 times per day)  Ibuprofen (Motrin) 200 mg tablets - take max 4 tablets (800 mg) every 6 hours*  Sinus Congestion Prescription Atrovent as directed Nasal Saline if desired Phenylephrine (Sudafed) 10 mg tablets every 4 hours (or the 12-hour formulation)* Diphenhydramine (Benadryl) 25 mg tablets - take max 2 tablets every 4 hours  Cough & Sore Throat Prescription cough pills or syrups as directed Dextromethorphan (Robitussin, others) - cough suppressant Guaifenesin (Robitussin, Mucinex, others) - expectorant (helps cough up mucus) (Dextromethorphan and Guaifenesin also come in a combination tablet) Lozenges w/ Benzocaine + Menthol (Cepacol) Honey - as much as you want! Teas which "coat the throat" - look for ingredients Elm Bark, Licorice Root, Marshmallow Root  Other Antibiotics if these are needed - take ALL, even if you're feeling better  Zinc Lozenges within 24 hours of symptoms onset - mixed evidence this shortens the duration of the common cold Don't waste your money on Vitamin C or Echinacea  *Caution in patients with high blood pressure       Follow-up plan: Return if symptoms worsen or fail to  improve.  Visit summary with medication list and pertinent instructions was printed for patient to review, alert [place] if any changes needed. All questions at time of visit were answered - patient instructed to contact office with any additional concerns. ER/RTC precautions were reviewed with the patient and understanding verbalized.   Note: Total time spent 25 minutes, greater than 50% of the visit was spent face-to-face counseling and coordinating care for the following: The primary encounter diagnosis was Viral URI with cough. A diagnosis of Tremor of right hand was also pertinent to this visit.Marland Kitchen  Please note: voice recognition software was used to produce this document, and typos may escape review. Please contact Dr. Sheppard Coil for any needed clarifications.

## 2017-10-22 NOTE — Telephone Encounter (Signed)
RX for xanax has been faxed to [facility]. Received a receipt of confirmation.

## 2017-10-22 NOTE — Telephone Encounter (Signed)
I made patient aware that Dr. Rexene Alberts will not prescribe xanax w/o a driver. Patient voiced understanding.

## 2017-10-22 NOTE — Telephone Encounter (Signed)
I called pt again to discuss. No answer, left a message asking her to call me back. 

## 2017-10-22 NOTE — Addendum Note (Signed)
Addended by: Lester [place] A on: 10/22/2017 05:03 PM   Modules accepted: Orders

## 2017-10-23 NOTE — Telephone Encounter (Signed)
Talked with pt today & she states that she has seen an neurologist that thinks she may have parkinson's ds.  She has an MRI scheduled for this w/e.  She is still taking her anastrozole but wishes not to be on any meds & states she was just trying to find an excuse not to take it.  She will continue for now & let us know what MRI shows.

## 2017-10-26 ENCOUNTER — Ambulatory Visit
Admission: RE | Admit: 2017-10-26 | Discharge: 2017-10-26 | Disposition: A | Discharge status: Home / Self Care | Payer: Medicare Other | Source: Ambulatory Visit | Attending: Neurology | Admitting: Neurology

## 2017-10-26 DIAGNOSIS — G20 Parkinson's disease: Secondary | ICD-10-CM

## 2017-10-26 DIAGNOSIS — R251 Tremor, unspecified: Secondary | ICD-10-CM | POA: Diagnosis not present

## 2017-10-30 ENCOUNTER — Telehealth: Payer: Self-pay | Admitting: Neurology

## 2017-10-30 NOTE — Telephone Encounter (Signed)
Patient is calling to discuss MRI results.

## 2017-10-30 NOTE — Telephone Encounter (Signed)
I called pt. I advised her that per Dr. Rexene Alberts, her MRI showed age appropriate findings. Pt has many questions about her MRI and wants to know if Dr. Rexene Alberts will start her on medications for her tremor.  I spoke to Dr. Rexene Alberts, she does not want to start pt on medications right now and will discuss subsequent diagnostic testing at a sooner office visit than May.  I called pt. She is agreeable to coming in on 12/26/17 at 2:00pm to discuss these results and her questions. Pt verbalized understanding of results, and new appt date and time.

## 2017-10-30 NOTE — Progress Notes (Signed)
Recent MRI brain without contrast shows age-appropriate findings. Please notify patient about the reassuring results.  Sarah Mata

## 2017-11-06 ENCOUNTER — Ambulatory Visit: Payer: Medicare Other | Admitting: Rheumatology

## 2017-11-21 ENCOUNTER — Ambulatory Visit: Payer: Medicare Other | Admitting: Hematology

## 2017-11-21 ENCOUNTER — Other Ambulatory Visit: Payer: Medicare Other

## 2017-12-10 ENCOUNTER — Telehealth: Payer: Self-pay | Admitting: Neurology

## 2017-12-10 NOTE — Telephone Encounter (Signed)
I called Sarah Mata, left a detailed message on Sarah Mata's cell phone number, per DPR, advising her of Dr. Guadelupe Sabin advice. I asked Sarah Mata to call back for further questions or concerns.

## 2017-12-10 NOTE — Telephone Encounter (Signed)
Patient calling to advise she does not have tremors most mornings  just tin the afternoon. Is this a sign of Parkinson's or tremors. Please call and advise.

## 2017-12-10 NOTE — Telephone Encounter (Signed)
Tremors can fluctuate on a day to day basis and also during the day. There may not be a rhyme or reason for the fluctuation. Some people wake up feeling fairly well, we call that "sleep benefit", so it is possible, that her tremor is less in AM and worse in the afternoons.

## 2017-12-13 ENCOUNTER — Ambulatory Visit
Admission: RE | Admit: 2017-12-13 | Discharge: 2017-12-13 | Disposition: A | Discharge status: Home / Self Care | Payer: Medicare Other | Source: Ambulatory Visit | Attending: Hematology | Admitting: Hematology

## 2017-12-13 ENCOUNTER — Ambulatory Visit: Payer: Medicare Other | Admitting: Family Medicine

## 2017-12-13 DIAGNOSIS — C50911 Malignant neoplasm of unspecified site of right female breast: Secondary | ICD-10-CM

## 2017-12-20 ENCOUNTER — Other Ambulatory Visit: Payer: Self-pay | Admitting: Hematology

## 2017-12-20 ENCOUNTER — Ambulatory Visit
Admission: RE | Admit: 2017-12-20 | Discharge: 2017-12-20 | Disposition: A | Discharge status: Home / Self Care | Payer: Medicare Other | Source: Ambulatory Visit | Attending: Hematology | Admitting: Hematology

## 2017-12-20 ENCOUNTER — Telehealth: Payer: Self-pay | Admitting: *Deleted

## 2017-12-20 DIAGNOSIS — N6321 Unspecified lump in the left breast, upper outer quadrant: Secondary | ICD-10-CM | POA: Diagnosis not present

## 2017-12-20 DIAGNOSIS — N6320 Unspecified lump in the left breast, unspecified quadrant: Secondary | ICD-10-CM

## 2017-12-20 DIAGNOSIS — R922 Inconclusive mammogram: Secondary | ICD-10-CM | POA: Diagnosis not present

## 2017-12-20 DIAGNOSIS — N6489 Other specified disorders of breast: Secondary | ICD-10-CM

## 2017-12-20 DIAGNOSIS — N6322 Unspecified lump in the left breast, upper inner quadrant: Secondary | ICD-10-CM | POA: Diagnosis not present

## 2017-12-20 HISTORY — DX: Personal history of antineoplastic chemotherapy: Z92.21

## 2017-12-20 HISTORY — DX: Personal history of irradiation: Z92.3

## 2017-12-20 NOTE — Telephone Encounter (Signed)
"  [facility] calling to request verbal order for left breast ultrasound.  Dr. Radford Pax see's distortion in left breast.  Verbal order received and read back from Dr. Burr Medico for Left breast U/S.  [facility] entered order.

## 2017-12-23 ENCOUNTER — Ambulatory Visit
Admission: RE | Admit: 2017-12-23 | Discharge: 2017-12-23 | Disposition: A | Discharge status: Home / Self Care | Payer: Medicare Other | Source: Ambulatory Visit | Attending: Hematology | Admitting: Hematology

## 2017-12-23 DIAGNOSIS — N6489 Other specified disorders of breast: Secondary | ICD-10-CM

## 2017-12-23 DIAGNOSIS — N6012 Diffuse cystic mastopathy of left breast: Secondary | ICD-10-CM | POA: Diagnosis not present

## 2017-12-23 DIAGNOSIS — N6321 Unspecified lump in the left breast, upper outer quadrant: Secondary | ICD-10-CM | POA: Diagnosis not present

## 2017-12-26 ENCOUNTER — Ambulatory Visit: Payer: Self-pay | Admitting: Neurology

## 2017-12-27 ENCOUNTER — Other Ambulatory Visit: Payer: Medicare Other

## 2017-12-27 ENCOUNTER — Other Ambulatory Visit: Payer: Self-pay | Admitting: Nurse Practitioner

## 2017-12-27 ENCOUNTER — Inpatient Hospital Stay: Payer: Medicare Other | Attending: Hematology

## 2017-12-27 DIAGNOSIS — C50911 Malignant neoplasm of unspecified site of right female breast: Secondary | ICD-10-CM | POA: Diagnosis not present

## 2017-12-27 LAB — COMPREHENSIVE METABOLIC PANEL
ALK PHOS: 122 U/L (ref 40–150)
ALT: 82 U/L — ABNORMAL HIGH (ref 0–55)
AST: 57 U/L — AB (ref 5–34)
Albumin: 3.5 g/dL (ref 3.5–5.0)
Anion gap: 8 (ref 3–11)
BILIRUBIN TOTAL: 0.4 mg/dL (ref 0.2–1.2)
BUN: 17 mg/dL (ref 7–26)
CO2: 26 mmol/L (ref 22–29)
Calcium: 9.3 mg/dL (ref 8.4–10.4)
Chloride: 101 mmol/L (ref 98–109)
Creatinine, Ser: 0.96 mg/dL (ref 0.60–1.10)
GFR calc Af Amer: >60 mL/min (ref >60)
GFR calc non Af Amer: 57 mL/min — ABNORMAL LOW (ref >60)
Glucose, Bld: 104 mg/dL (ref 70–140)
POTASSIUM: 4.4 mmol/L (ref 3.5–5.1)
Sodium: 135 mmol/L — ABNORMAL LOW (ref 136–145)
TOTAL PROTEIN: 7.1 g/dL (ref 6.4–8.3)

## 2017-12-27 LAB — CBC WITH DIFFERENTIAL/PLATELET
BASOS ABS: 0.1 10*3/uL (ref 0.0–0.1)
BASOS PCT: 1 %
Eosinophils Absolute: 0.1 10*3/uL (ref 0.0–0.5)
Eosinophils Relative: 2 %
HEMATOCRIT: 42.7 % (ref 34.8–46.6)
HEMOGLOBIN: 14.3 g/dL (ref 11.6–15.9)
Lymphocytes Relative: 23 %
Lymphs Abs: 1.4 10*3/uL (ref 0.9–3.3)
MCH: 30.7 pg (ref 25.1–34.0)
MCHC: 33.5 g/dL (ref 31.5–36.0)
MCV: 91.6 fL (ref 79.5–101.0)
Monocytes Absolute: 0.8 10*3/uL (ref 0.1–0.9)
Monocytes Relative: 14 %
NEUTROS ABS: 3.6 10*3/uL (ref 1.5–6.5)
NEUTROS PCT: 60 %
Platelets: 152 10*3/uL (ref 145–400)
RBC: 4.66 MIL/uL (ref 3.70–5.45)
RDW: 13 % (ref 11.2–14.5)
WBC: 6 10*3/uL (ref 3.9–10.3)

## 2017-12-28 LAB — CANCER ANTIGEN 27.29: CA 27.29: 56.1 U/mL — ABNORMAL HIGH (ref 0.0–38.6)

## 2017-12-30 ENCOUNTER — Telehealth: Payer: Self-pay | Admitting: *Deleted

## 2017-12-30 ENCOUNTER — Telehealth: Payer: Self-pay | Admitting: Hematology

## 2017-12-30 NOTE — Telephone Encounter (Signed)
I called patient, and discussed her lab results from last week.  She has mild elevated liver enzymes, less severe than before in 06/2017.  Okay to continue anastrozole.  I will see her back in May as scheduled.  Truitt Merle  12/30/2017

## 2017-12-30 NOTE — Telephone Encounter (Signed)
Received vm call from pt asking for lab results from Friday.  Message to Dr Burr Medico.  Call back # is [phone]

## 2018-01-03 ENCOUNTER — Telehealth: Payer: Self-pay | Admitting: *Deleted

## 2018-01-03 NOTE — Telephone Encounter (Signed)
Received call from pt stating that she has had several breast procedures & now they want an MRI & she wants to know why they just didn't do an MRI first.  Explained that insurance probably wouldn't pay for MRI first & MRI is probably being done b/c something was seen on other test that they need to look at more closely.  Instructed to call [facility] for further info since recommended by them.  Informed Dr Burr Medico & she is in agreement.

## 2018-01-06 ENCOUNTER — Encounter: Payer: Self-pay | Admitting: Obstetrics & Gynecology

## 2018-01-06 ENCOUNTER — Ambulatory Visit (INDEPENDENT_AMBULATORY_CARE_PROVIDER_SITE_OTHER): Payer: Medicare Other | Admitting: Obstetrics & Gynecology

## 2018-01-06 VITALS — BP 128/80 | Ht 63.5 in | Wt 201.0 lb

## 2018-01-06 DIAGNOSIS — Z124 Encounter for screening for malignant neoplasm of cervix: Secondary | ICD-10-CM | POA: Diagnosis not present

## 2018-01-06 DIAGNOSIS — R251 Tremor, unspecified: Secondary | ICD-10-CM | POA: Diagnosis not present

## 2018-01-06 DIAGNOSIS — Z780 Asymptomatic menopausal state: Secondary | ICD-10-CM

## 2018-01-06 DIAGNOSIS — Z01411 Encounter for gynecological examination (general) (routine) with abnormal findings: Secondary | ICD-10-CM

## 2018-01-06 DIAGNOSIS — Z6835 Body mass index (BMI) 35.0-35.9, adult: Secondary | ICD-10-CM

## 2018-01-06 DIAGNOSIS — C50911 Malignant neoplasm of unspecified site of right female breast: Secondary | ICD-10-CM | POA: Diagnosis not present

## 2018-01-06 DIAGNOSIS — Z9189 Other specified personal risk factors, not elsewhere classified: Secondary | ICD-10-CM | POA: Diagnosis not present

## 2018-01-06 DIAGNOSIS — E6609 Other obesity due to excess calories: Secondary | ICD-10-CM | POA: Diagnosis not present

## 2018-01-06 NOTE — Progress Notes (Signed)
Sarah Mata [birth year] [id number]   History:    77 y.o. G0 Widowed.  Moved from [place] where family lives/[place] for a few yrs just before her husband passed away is.  RP:  New patient presenting for annual gyn exam   HPI:  Menopause.  No HRT.  No PMB.  No pelvic pain.  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  Left Breast Bx benign 12/23/2017.  Urine/BMs wnl.  BMI 35.05.  Walking occasionally.  Past medical history,surgical history, family history and social history were all reviewed and documented in the EPIC chart.  Gynecologic History No LMP recorded. Patient is postmenopausal. Contraception: post menopausal status Last Pap: Unknown. Results were: Normal per patient Last mammogram: 11/2017. Results were: abnormal.  Left Breast Bx benign.  MRI planned. Bone Density: Osteope thenia many years ago Colonoscopy: 07/2017  Obstetric History OB History  Gravida Para Term Preterm AB Living  0 0 0 0 0 0  SAB TAB Ectopic Multiple Live Births  0 0 0 0 0         ROS: A ROS was performed and pertinent positives and negatives are included in the history.  GENERAL: No fevers or chills. HEENT: No change in vision, no earache, sore throat or sinus congestion. NECK: No pain or stiffness. CARDIOVASCULAR: No chest pain or pressure. No palpitations. PULMONARY: No shortness of breath, cough or wheeze. GASTROINTESTINAL: No abdominal pain, nausea, vomiting or diarrhea, melena or bright red blood per rectum. GENITOURINARY: No urinary frequency, urgency, hesitancy or dysuria. MUSCULOSKELETAL: No joint or muscle pain, no back pain, no recent trauma. DERMATOLOGIC: No rash, no itching, no lesions. ENDOCRINE: No polyuria, polydipsia, no heat or cold intolerance. No recent change in weight. HEMATOLOGICAL: No anemia or easy bruising or bleeding. NEUROLOGIC: No headache, seizures, numbness, tingling or weakness.  PSYCHIATRIC: No depression, no loss of interest in normal activity or change in sleep pattern.     Exam:   BP 128/80   Ht 5' 3.5" (1.613 m)   Wt 201 lb (91.2 kg)   BMI 35.05 kg/m   Body mass index is 35.05 kg/m.  General appearance : Well developed well nourished female. No acute distress HEENT: Eyes: no retinal hemorrhage or exudates,  Neck supple, trachea midline, no carotid bruits, no thyroidmegaly Lungs: Clear to auscultation, no rhonchi or wheezes, or rib retractions  Heart: Regular rate and rhythm, no murmurs or gallops Breast:Examined in sitting and supine position were symmetrical in appearance, no palpable masses or tenderness,  no skin retraction, no nipple inversion, no nipple discharge, no skin discoloration, no axillary or supraclavicular lymphadenopathy Abdomen: no palpable masses or tenderness, no rebound or guarding Extremities: no edema or skin discoloration or tenderness  Pelvic: Vulva: Normal             Vagina: No gross lesions or discharge  Cervix: No gross lesions or discharge.  Pap reflex done.  Uterus AV, normal size, shape and consistency, non-tender and mobile  Adnexa  Without masses or tenderness  Anus: Normal   Assessment/Plan:  77 y.o. female for annual exam   1. Encounter for gynecological examination with abnormal finding Normal gynecologic exam in menopause.  Pap reflex done.  Breast exam normal today.  Recent left breast biopsy benign.  Will have an MRI of the left breast.  Health labs with family physician.  2. Menopause present Well on no hormone replacement therapy.  No postmenopausal bleeding.  3. 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  Followed by Dr. Burr Medico.  4. Class 2 obesity due to excess calories without serious comorbidity with body mass index (BMI) of 35.0 to 35.9 in adult Low calorie/low carb diet reviewed, recommend [employer].  Regular physical activity including aerobic activities 5 times a week and weight  lifting every 2 days recommended.  5. Tremor of right hand Would like to see a new neurologist for second opinion.  As tried natural products without improvement of the tremor.  Tremor is worse when tired or nervous.  Counseling on above issues more than 50% for 20 minutes.  Princess Bruins MD, 1:54 PM 01/06/2018

## 2018-01-06 NOTE — Patient Instructions (Addendum)
1. Encounter for gynecological examination with abnormal finding Normal gynecologic exam in menopause.  Pap reflex done.  Breast exam normal today.  Recent left breast biopsy benign.  Will have an MRI of the left breast.  Health labs with family physician.  2. Menopause present Well on no hormone replacement therapy.  No postmenopausal bleeding.  3. 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  Followed by Dr. Burr Medico.  4. Class 2 obesity due to excess calories without serious comorbidity with body mass index (BMI) of 35.0 to 35.9 in adult Low calorie/low carb diet reviewed, recommend [employer].  Regular physical activity including aerobic activities 5 times a week and weight lifting every 2 days recommended.  5. Tremor of right hand Would like to see a new neurologist for second opinion.  As tried natural products without improvement of the tremor.  Tremor is worse when tired or nervous.   Sarah Mata, it was a pleasure meeting you today!  I will inform you of your results as soon as they are available.   Exercising to Lose Weight Exercising can help you to lose weight. In order to lose weight through exercise, you need to do vigorous-intensity exercise. You can tell that you are exercising with vigorous intensity if you are breathing very hard and fast and cannot hold a conversation while exercising. Moderate-intensity exercise helps to maintain your current weight. You can tell that you are exercising at a moderate level if you have a higher heart rate and faster breathing, but you are still able to hold a conversation. How often should I exercise? Choose an activity that you enjoy and set realistic goals. Your health care provider can help you to make an activity plan that works for you. Exercise regularly as directed by your health care provider. This may include:  Doing resistance training twice each week, such as: ? Push-ups. ? Sit-ups. ? Lifting weights. ? Using resistance  bands.  Doing a given intensity of exercise for a given amount of time. Choose from these options: ? 150 minutes of moderate-intensity exercise every week. ? 75 minutes of vigorous-intensity exercise every week. ? A mix of moderate-intensity and vigorous-intensity exercise every week.  Children, pregnant women, people who are out of shape, people who are overweight, and older adults may need to consult a health care provider for individual recommendations. If you have any sort of medical condition, be sure to consult your health care provider before starting a new exercise program. What are some activities that can help me to lose weight?  Walking at a rate of at least 4.5 miles an hour.  Jogging or running at a rate of 5 miles per hour.  Biking at a rate of at least 10 miles per hour.  Lap swimming.  Roller-skating or in-line skating.  Cross-country skiing.  Vigorous competitive sports, such as football, basketball, and soccer.  Jumping rope.  Aerobic dancing. How can I be more active in my day-to-day activities?  Use the stairs instead of the elevator.  Take a walk during your lunch break.  If you drive, park your car farther away from work or school.  If you take public transportation, get off one stop early and walk the rest of the way.  Make all of your phone calls while standing up and walking around.  Get up, stretch, and walk around every 30 minutes throughout the day. What guidelines should I follow while exercising?  Do not exercise so much that you hurt  yourself, feel dizzy, or get very short of breath.  Consult your health care provider prior to starting a new exercise program.  Wear comfortable clothes and shoes with good support.  Drink plenty of water while you exercise to prevent dehydration or heat stroke. Body water is lost during exercise and must be replaced.  Work out until you breathe faster and your heart beats faster. This information is not  intended to replace advice given to you by your health care provider. Make sure you discuss any questions you have with your health care provider. Document Released: 12/08/2010 Document Revised: 04/12/2016 Document Reviewed: 04/08/2014 Elsevier Interactive Patient Education  2018 [place] [employer] Calories are units of energy. Your body needs a certain amount of calories from food to keep you going throughout the day. When you eat more calories than your body needs, your body stores the extra calories as fat. When you eat fewer calories than your body needs, your body burns fat to get the energy it needs. Calorie counting means keeping track of how many calories you eat and drink each day. Calorie counting can be helpful if you need to lose weight. If you make sure to eat fewer calories than your body needs, you should lose weight. Ask your health care provider what a healthy weight is for you. For calorie counting to work, you will need to eat the right number of calories in a day in order to lose a healthy amount of weight per week. A dietitian can help you determine how many calories you need in a day and will give you suggestions on how to reach your calorie goal.  A healthy amount of weight to lose per week is usually 1-2 lb (0.5-0.9 kg). This usually means that your daily calorie intake should be reduced by 500-750 calories.  Eating 1,200 - 1,500 calories per day can help most women lose weight.  Eating 1,500 - 1,800 calories per day can help most men lose weight.  What is my plan? My goal is to have __________ calories per day. If I have this many calories per day, I should lose around __________ pounds per week. What do I need to know about calorie counting? In order to meet your daily calorie goal, you will need to:  Find out how many calories are in each food you would like to eat. Try to do this before you eat.  Decide how much of the food you plan to  eat.  Write down what you ate and how many calories it had. Doing this is called keeping a food log.  To successfully lose weight, it is important to balance calorie counting with a healthy lifestyle that includes regular activity. Aim for 150 minutes of moderate exercise (such as walking) or 75 minutes of vigorous exercise (such as running) each week. Where do I find calorie information?  The number of calories in a food can be found on a Nutrition Facts label. If a food does not have a Nutrition Facts label, try to look up the calories online or ask your dietitian for help. Remember that calories are listed per serving. If you choose to have more than one serving of a food, you will have to multiply the calories per serving by the amount of servings you plan to eat. For example, the label on a package of bread might say that a serving size is 1 slice and that there are 90 calories in a serving.  If you eat 1 slice, you will have eaten 90 calories. If you eat 2 slices, you will have eaten 180 calories. How do I keep a food log? Immediately after each meal, record the following information in your food log:  What you ate. Don't forget to include toppings, sauces, and other extras on the food.  How much you ate. This can be measured in cups, ounces, or number of items.  How many calories each food and drink had.  The total number of calories in the meal.  Keep your food log near you, such as in a small notebook in your pocket, or use a mobile app or website. Some programs will calculate calories for you and show you how many calories you have left for the day to meet your goal. What are some calorie counting tips?  Use your calories on foods and drinks that will fill you up and not leave you hungry: ? Some examples of foods that fill you up are nuts and nut butters, vegetables, lean proteins, and high-fiber foods like whole grains. High-fiber foods are foods with more than 5 g fiber per  serving. ? Drinks such as sodas, specialty coffee drinks, alcohol, and juices have a lot of calories, yet do not fill you up.  Eat nutritious foods and avoid empty calories. Empty calories are calories you get from foods or beverages that do not have many vitamins or protein, such as candy, sweets, and soda. It is better to have a nutritious high-calorie food (such as an avocado) than a food with few nutrients (such as a bag of chips).  Know how many calories are in the foods you eat most often. This will help you calculate calorie counts faster.  Pay attention to calories in drinks. Low-calorie drinks include water and unsweetened drinks.  Pay attention to nutrition labels for "low fat" or "fat free" foods. These foods sometimes have the same amount of calories or more calories than the full fat versions. They also often have added sugar, starch, or salt, to make up for flavor that was removed with the fat.  Find a way of tracking calories that works for you. Get creative. Try different apps or programs if writing down calories does not work for you. What are some portion control tips?  Know how many calories are in a serving. This will help you know how many servings of a certain food you can have.  Use a measuring cup to measure serving sizes. You could also try weighing out portions on a kitchen scale. With time, you will be able to estimate serving sizes for some foods.  Take some time to put servings of different foods on your favorite plates, bowls, and cups so you know what a serving looks like.  Try not to eat straight from a bag or box. Doing this can lead to overeating. Put the amount you would like to eat in a cup or on a plate to make sure you are eating the right portion.  Use smaller plates, glasses, and bowls to prevent overeating.  Try not to multitask (for example, watch TV or use your computer) while eating. If it is time to eat, sit down at a table and enjoy your food.  This will help you to know when you are full. It will also help you to be aware of what you are eating and how much you are eating. What are tips for following this plan? Reading food labels  Check the  calorie count compared to the serving size. The serving size may be smaller than what you are used to eating.  Check the source of the calories. Make sure the food you are eating is high in vitamins and protein and low in saturated and trans fats. Shopping  Read nutrition labels while you shop. This will help you make healthy decisions before you decide to purchase your food.  Make a grocery list and stick to it. Cooking  Try to cook your favorite foods in a healthier way. For example, try baking instead of frying.  Use low-fat dairy products. Meal planning  Use more fruits and vegetables. Half of your plate should be fruits and vegetables.  Include lean proteins like poultry and fish. How do I count calories when eating out?  Ask for smaller portion sizes.  Consider sharing an entree and sides instead of getting your own entree.  If you get your own entree, eat only half. Ask for a box at the beginning of your meal and put the rest of your entree in it so you are not tempted to eat it.  If calories are listed on the menu, choose the lower calorie options.  Choose dishes that include vegetables, fruits, whole grains, low-fat dairy products, and lean protein.  Choose items that are boiled, broiled, grilled, or steamed. Stay away from items that are buttered, battered, fried, or served with cream sauce. Items labeled "crispy" are usually fried, unless stated otherwise.  Choose water, low-fat milk, unsweetened iced tea, or other drinks without added sugar. If you want an alcoholic beverage, choose a lower calorie option such as a glass of wine or light beer.  Ask for dressings, sauces, and syrups on the side. These are usually high in calories, so you should limit the amount you  eat.  If you want a salad, choose a garden salad and ask for grilled meats. Avoid extra toppings like bacon, cheese, or fried items. Ask for the dressing on the side, or ask for olive oil and vinegar or lemon to use as dressing.  Estimate how many servings of a food you are given. For example, a serving of cooked rice is  cup or about the size of half a baseball. Knowing serving sizes will help you be aware of how much food you are eating at restaurants. The list below tells you how big or small some common portion sizes are based on everyday objects: ? 1 oz-4 stacked dice. ? 3 oz-1 deck of cards. ? 1 tsp-1 die. ? 1 Tbsp- a ping-pong ball. ? 2 Tbsp-1 ping-pong ball. ?  cup- baseball. ? 1 cup-1 baseball. Summary  Calorie counting means keeping track of how many calories you eat and drink each day. If you eat fewer calories than your body needs, you should lose weight.  A healthy amount of weight to lose per week is usually 1-2 lb (0.5-0.9 kg). This usually means reducing your daily calorie intake by 500-750 calories.  The number of calories in a food can be found on a Nutrition Facts label. If a food does not have a Nutrition Facts label, try to look up the calories online or ask your dietitian for help.  Use your calories on foods and drinks that will fill you up, and not on foods and drinks that will leave you hungry.  Use smaller plates, glasses, and bowls to prevent overeating. This information is not intended to replace advice given to you by your health care provider. Make  sure you discuss any questions you have with your health care provider. Document Released: 11/05/2005 Document Revised: 10/05/2016 Document Reviewed: 10/05/2016 Elsevier Interactive Patient Education  2018 [place] Maintenance for Postmenopausal Women Menopause is a normal process in which your reproductive ability comes to an end. This process happens gradually over a span of months to years,  usually between the ages of 78 and 32. Menopause is complete when you have missed 12 consecutive menstrual periods. It is important to talk with your health care provider about some of the most common conditions that affect postmenopausal women, such as heart disease, cancer, and bone loss (osteoporosis). Adopting a healthy lifestyle and getting preventive care can help to promote your health and wellness. Those actions can also lower your chances of developing some of these common conditions. What should I know about menopause? During menopause, you may experience a number of symptoms, such as:  Moderate-to-severe hot flashes.  Night sweats.  Decrease in sex drive.  Mood swings.  Headaches.  Tiredness.  Irritability.  Memory problems.  Insomnia.  Choosing to treat or not to treat menopausal changes is an individual decision that you make with your health care provider. What should I know about hormone replacement therapy and supplements? Hormone therapy products are effective for treating symptoms that are associated with menopause, such as hot flashes and night sweats. Hormone replacement carries certain risks, especially as you become older. If you are thinking about using estrogen or estrogen with progestin treatments, discuss the benefits and risks with your health care provider. What should I know about heart disease and stroke? Heart disease, heart attack, and stroke become more likely as you age. This may be due, in part, to the hormonal changes that your body experiences during menopause. These can affect how your body processes dietary fats, triglycerides, and cholesterol. Heart attack and stroke are both medical emergencies. There are many things that you can do to help prevent heart disease and stroke:  Have your blood pressure checked at least every 1-2 years. High blood pressure causes heart disease and increases the risk of stroke.  If you are 9-64 years old, ask your  health care provider if you should take aspirin to prevent a heart attack or a stroke.  Do not use any tobacco products, including cigarettes, chewing tobacco, or electronic cigarettes. If you need help quitting, ask your health care provider.  It is important to eat a healthy diet and maintain a healthy weight. ? Be sure to include plenty of vegetables, fruits, low-fat dairy products, and lean protein. ? Avoid eating foods that are high in solid fats, added sugars, or salt (sodium).  Get regular exercise. This is one of the most important things that you can do for your health. ? Try to exercise for at least 150 minutes each week. The type of exercise that you do should increase your heart rate and make you sweat. This is known as moderate-intensity exercise. ? Try to do strengthening exercises at least twice each week. Do these in addition to the moderate-intensity exercise.  Know your numbers.Ask your health care provider to check your cholesterol and your blood glucose. Continue to have your blood tested as directed by your health care provider.  What should I know about cancer screening? There are several types of cancer. Take the following steps to reduce your risk and to catch any cancer development as early as possible. Breast Cancer  Practice breast self-awareness. ? This means understanding how your  breasts normally appear and feel. ? It also means doing regular breast self-exams. Let your health care provider know about any changes, no matter how small.  If you are 58 or older, have a clinician do a breast exam (clinical breast exam or CBE) every year. Depending on your age, family history, and medical history, it may be recommended that you also have a yearly breast X-ray (mammogram).  If you have a family history of breast cancer, talk with your health care provider about genetic screening.  If you are at high risk for breast cancer, talk with your health care provider about  having an MRI and a mammogram every year.  Breast cancer (BRCA) gene test is recommended for women who have family members with BRCA-related cancers. Results of the assessment will determine the need for genetic counseling and BRCA1 and for BRCA2 testing. BRCA-related cancers include these types: ? Breast. This occurs in males or females. ? Ovarian. ? Tubal. This may also be called fallopian tube cancer. ? Cancer of the abdominal or pelvic lining (peritoneal cancer). ? Prostate. ? Pancreatic.  Cervical, Uterine, and Ovarian Cancer Your health care provider may recommend that you be screened regularly for cancer of the pelvic organs. These include your ovaries, uterus, and vagina. This screening involves a pelvic exam, which includes checking for microscopic changes to the surface of your cervix (Pap test).  For women ages 21-65, health care providers may recommend a pelvic exam and a Pap test every three years. For women ages 78-65, they may recommend the Pap test and pelvic exam, combined with testing for human papilloma virus (HPV), every five years. Some types of HPV increase your risk of cervical cancer. Testing for HPV may also be done on women of any age who have unclear Pap test results.  Other health care providers may not recommend any screening for nonpregnant women who are considered low risk for pelvic cancer and have no symptoms. Ask your health care provider if a screening pelvic exam is right for you.  If you have had past treatment for cervical cancer or a condition that could lead to cancer, you need Pap tests and screening for cancer for at least 20 years after your treatment. If Pap tests have been discontinued for you, your risk factors (such as having a new sexual partner) need to be reassessed to determine if you should start having screenings again. Some women have medical problems that increase the chance of getting cervical cancer. In these cases, your health care provider  may recommend that you have screening and Pap tests more often.  If you have a family history of uterine cancer or ovarian cancer, talk with your health care provider about genetic screening.  If you have vaginal bleeding after reaching menopause, tell your health care provider.  There are currently no reliable tests available to screen for ovarian cancer.  Lung Cancer Lung cancer screening is recommended for adults 80-31 years old who are at high risk for lung cancer because of a history of smoking. A yearly low-dose CT scan of the lungs is recommended if you:  Currently smoke.  Have a history of at least 30 pack-years of smoking and you currently smoke or have quit within the past 15 years. A pack-year is smoking an average of one pack of cigarettes per day for one year.  Yearly screening should:  Continue until it has been 15 years since you quit.  Stop if you develop a health problem that would  prevent you from having lung cancer treatment.  Colorectal Cancer  This type of cancer can be detected and can often be prevented.  Routine colorectal cancer screening usually begins at age 30 and continues through age 59.  If you have risk factors for colon cancer, your health care provider may recommend that you be screened at an earlier age.  If you have a family history of colorectal cancer, talk with your health care provider about genetic screening.  Your health care provider may also recommend using home test kits to check for hidden blood in your stool.  A small camera at the end of a tube can be used to examine your colon directly (sigmoidoscopy or colonoscopy). This is done to check for the earliest forms of colorectal cancer.  Direct examination of the colon should be repeated every 5-10 years until age 52. However, if early forms of precancerous polyps or small growths are found or if you have a family history or genetic risk for colorectal cancer, you may need to be screened  more often.  Skin Cancer  Check your skin from head to toe regularly.  Monitor any moles. Be sure to tell your health care provider: ? About any new moles or changes in moles, especially if there is a change in a mole's shape or color. ? If you have a mole that is larger than the size of a pencil eraser.  If any of your family members has a history of skin cancer, especially at a young age, talk with your health care provider about genetic screening.  Always use sunscreen. Apply sunscreen liberally and repeatedly throughout the day.  Whenever you are outside, protect yourself by wearing long sleeves, pants, a wide-brimmed hat, and sunglasses.  What should I know about osteoporosis? Osteoporosis is a condition in which bone destruction happens more quickly than new bone creation. After menopause, you may be at an increased risk for osteoporosis. To help prevent osteoporosis or the bone fractures that can happen because of osteoporosis, the following is recommended:  If you are 67-31 years old, get at least 1,000 mg of calcium and at least 600 mg of vitamin D per day.  If you are older than age 23 but younger than age 3, get at least 1,200 mg of calcium and at least 600 mg of vitamin D per day.  If you are older than age 79, get at least 1,200 mg of calcium and at least 800 mg of vitamin D per day.  Smoking and excessive alcohol intake increase the risk of osteoporosis. Eat foods that are rich in calcium and vitamin D, and do weight-bearing exercises several times each week as directed by your health care provider. What should I know about how menopause affects my mental health? Depression may occur at any age, but it is more common as you become older. Common symptoms of depression include:  Low or sad mood.  Changes in sleep patterns.  Changes in appetite or eating patterns.  Feeling an overall lack of motivation or enjoyment of activities that you previously enjoyed.  Frequent  crying spells.  Talk with your health care provider if you think that you are experiencing depression. What should I know about immunizations? It is important that you get and maintain your immunizations. These include:  Tetanus, diphtheria, and pertussis (Tdap) booster vaccine.  Influenza every year before the flu season begins.  Pneumonia vaccine.  Shingles vaccine.  Your health care provider may also recommend other immunizations. This  information is not intended to replace advice given to you by your health care provider. Make sure you discuss any questions you have with your health care provider. Document Released: 12/28/2005 Document Revised: 05/25/2016 Document Reviewed: 08/09/2015 Elsevier Interactive Patient Education  2018 [employer].

## 2018-01-08 LAB — PAP IG W/ RFLX HPV ASCU

## 2018-01-09 ENCOUNTER — Encounter: Payer: Self-pay | Admitting: Obstetrics & Gynecology

## 2018-01-10 ENCOUNTER — Telehealth: Payer: Self-pay | Admitting: *Deleted

## 2018-01-10 DIAGNOSIS — R251 Tremor, unspecified: Secondary | ICD-10-CM

## 2018-01-10 NOTE — Telephone Encounter (Signed)
-----   Message from Princess Bruins, MD sent at 01/06/2018  2:31 PM EST ----- Regarding: Refer to Neurologist for 2nd opinion Worsening Rt hand tremor.  Would like second opinion by Neurologist.

## 2018-01-10 NOTE — Telephone Encounter (Signed)
Referral placed at [facility] will call pt to schedule.

## 2018-01-15 ENCOUNTER — Other Ambulatory Visit: Payer: Medicare Other

## 2018-01-21 ENCOUNTER — Encounter: Payer: Self-pay | Admitting: Neurology

## 2018-01-21 NOTE — Telephone Encounter (Signed)
[facility] has left message x 1, I called her asking her to call them back to schedule.

## 2018-01-22 ENCOUNTER — Encounter: Payer: Self-pay | Admitting: Hematology

## 2018-01-22 NOTE — Telephone Encounter (Signed)
Pt scheduled on 02/10/18  With Dr.Tat

## 2018-01-26 ENCOUNTER — Encounter: Payer: Self-pay | Admitting: Neurology

## 2018-01-27 ENCOUNTER — Telehealth: Payer: Self-pay | Admitting: *Deleted

## 2018-01-27 NOTE — Telephone Encounter (Signed)
Pt records faxed Dr Inda Merlin [phone]
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
## 2018-02-10 ENCOUNTER — Ambulatory Visit (INDEPENDENT_AMBULATORY_CARE_PROVIDER_SITE_OTHER): Payer: Medicare Other | Admitting: Neurology

## 2018-02-10 ENCOUNTER — Encounter: Payer: Self-pay | Admitting: Neurology

## 2018-02-10 VITALS — BP 130/80 | HR 76 | Resp 16 | Ht 63.0 in | Wt 203.0 lb

## 2018-02-10 DIAGNOSIS — G20 Parkinson's disease: Secondary | ICD-10-CM | POA: Diagnosis not present

## 2018-02-10 NOTE — Patient Instructions (Signed)
1. You have been referred to [facility]. They will call you directly to schedule an appointment.  Please call [phone] if you do not hear from them.   2. We have made you an appt to meet with Myra Gianotti, our social worker, on Wednesday 02/12/18 at 10:00 am.

## 2018-02-12 ENCOUNTER — Other Ambulatory Visit: Payer: Medicare Other

## 2018-02-12 ENCOUNTER — Telehealth: Payer: Self-pay | Admitting: Neurology

## 2018-02-12 MED ORDER — CARBIDOPA-LEVODOPA 25-100 MG PO TABS: 1.0000 | ORAL_TABLET | Freq: Three times a day (TID) | ORAL | 1 refills | Status: DC

## 2018-02-12 NOTE — Telephone Encounter (Signed)
Spoke with patient and made aware of the following:  Start Carbidopa Levodopa as follows:  Take 1/2 tablet three times daily, at least 30 minutes before meals, for one week  Then take 1/2 tablet in the morning, 1/2 tablet in the afternoon, 1 tablet in the evening, at least 30 minutes before meals, for one week  Then take 1/2 tablet in the morning, 1 tablet in the afternoon, 1 tablet in the evening, at least 30 minutes before meals, for one week  Then take 1 tablet three times daily, at least 30 minutes before meals  RX sent to pharmacy.

## 2018-02-12 NOTE — Telephone Encounter (Signed)
Looks like patient refused medication at visit. Okay to start on Levodopa titration?

## 2018-02-12 NOTE — Telephone Encounter (Signed)
Left message on VM returning call

## 2018-02-12 NOTE — Telephone Encounter (Signed)
Left message on machine for patient to call back.

## 2018-02-12 NOTE — Telephone Encounter (Signed)
Tried to call patient again and received vm.

## 2018-02-12 NOTE — Telephone Encounter (Signed)
Yes, but stress importance of taking as directed and how to take it.

## 2018-02-12 NOTE — Telephone Encounter (Signed)
*  STAT* If patient is at the pharmacy, call can be transferred to refill team.  1.     Which medications need to be refilled? (please list name of each medication and dose if know:Carbadopa  2.     Which pharmacy/location (including street and city if local pharmacy) is medication to be sent to [facility]  3.     Do they need a 30 or 90 day supply?   Pt thinks it is the carbadopa that Dr Tat wanted to put her on

## 2018-02-13 ENCOUNTER — Telehealth: Payer: Self-pay | Admitting: Neurology

## 2018-02-13 ENCOUNTER — Encounter: Payer: Self-pay | Admitting: Psychology

## 2018-02-13 NOTE — Telephone Encounter (Signed)
Stopped what medication that she was prescribed??  She just asked for carbidopa/levodopa yesterday

## 2018-02-13 NOTE — Telephone Encounter (Signed)
Patient called needing to let you know that she has stopped taking her medication that she was prescribed. She lmom and did not leave the name of the medication. Thanks

## 2018-02-13 NOTE — Telephone Encounter (Signed)
Dr. Carles Collet Juluis Rainier. (she wanted to start Carbidopa Levodopa, this was sent yesterday to the pharmacy).

## 2018-02-13 NOTE — Progress Notes (Signed)
I met with the patient in the clinic today.  The purpose was to talk with her little bit about resources for Parkinson's disease, help her develop a plan for exercise and to explore any mental health concerns/needs that the patient is able to identify at this time.    Exercise: the patient reports that she has had a tough time attending cycle classes due to the cost of the transportation.  She volunteers at [employer].  We talked about the work in [employer] as being good social interactions/opportunity to be engaged in the community, but it is not a replacement for cardiovascular exercise that is needed in Parkinson's disease.  We talked about developing a plan and the patient appears to be most interested in attending the [facility] Parkinson's cycle 3 times a week.  Transportation: The patient is able to operate her vehicle, but reports that she would like to access assistance with transportation.  I provided her with information on the scat transportation program and will contact her with senior wheels.  I am not sure if she would be eligible due to her ability to transport herself at this time.  However, she may have possible mental health diagnoses that would support the need for this type of assistance.  Mental Health: in meeting with the patient it is evident that there is some significant anxiety with her diagnosis, possible paranoia and/or a mental health diagnosis that she has been diagnosed with in the past that her health care team is unaware of.  She did not disclose any previous mental health care or concerns.  In addition, she vocalized concerned about her sister being able to take over her care.  She stated that her sister is coming to visit in the next couple weeks and is concerned about this and what this means for her independence.  It is important to note that it seems to be helpful to set boundaries with the patient in regards to time and structure of  conversation.    PD Education/Info: The patient has a lot of anxiety related to her diagnosis.  She wanted to talk about what this diagnosis means for her in the future.  I provided her with some information on Parkinson's disease.  However, I focused on things that she could do to live well with her disease such as medication compliance, exercise, supportive therapies when needed and socialization/interaction with others.  I set boundaries that we could talk later about the later stages of Parkinson's disease, but for at this time the most important thing is for her to focus on self-care and wellness and getting education information about where she is at this time and her disease.  I will follow-up with information via mail for this senior wheels program for the patient.  She completed a scat application I will complete my part of the application and submitted for her.

## 2018-02-14 NOTE — Telephone Encounter (Signed)
Patient took 1/2 of one tablet of Carbidopa Levodopa and decided it wasn't for her. No side effects other than a blood blister that popped up on her chest that she is convinced is from the medication. She wants to try "natural remedies" and let her know this isn't recommended, but her choice.

## 2018-03-05 ENCOUNTER — Ambulatory Visit: Payer: Medicare Other | Admitting: Rheumatology

## 2018-03-25 NOTE — Progress Notes (Signed)
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 Grade 3, Ki-67 20%.      03/2014 Miscellaneous    mammaprint reviewed high-risk luminal type      04/2014 -  Radiation Therapy    Patient underwent radiation to the right chest wall      08/2014 -  Anti-estrogen oral therapy    Letrozole 2.5 mg started in 08/2014 and due to poor tolerance switched to Anastrozole 1.0 mg, put on hold due to elevated liver enzyme since 05/21/2017 and restarted on 06/26/17      08/06/2017 Pathology Results    Diagnosis, colonoscopy 1. Surgical [P], right colon biopsies - COLONIC MUCOSA WITH BENIGN LYMPHOID AGGREGATES. - NO MICROSCOPIC COLITIS, ACTIVE INFLAMMATION, CHRONIC CHANGES OR GRANULOMAS. 2. Surgical [P], left colon biopsies - COLONIC MUCOSA WITH BENIGN LYMPHOID AGGREGATES. - NO MICROSCOPIC COLITIS, ACTIVE INFLAMMATION, CHRONIC CHANGES OR GRANULOMAS.      08/06/2017 Procedure    Colonoscopy by Dr. Silverio Decamp on 08/06/17 IMPRESSION - Diverticulosis in the sigmoid colon and in the descending colon. - Non-bleeding internal  hemorrhoids. - The entire examined colon is normal. Biopsied.       12/20/2017 Mammogram    There are stable lumpectomy changes in the upper-outer right breast. No mass, nonsurgical distortion, or suspicious microcalcification is identified on the right to suggest malignancy.  There is subtle palpable thickening in the 12 o'clock to 1 o'clock left breast just superior to the nipple.   IMPRESSION: Architectural distortion in the 12 o'clock region of the left breast. Malignancy, postsurgical scarring, or complex sclerosing lesion are considerations.      12/20/2017 Breast US    Targeted ultrasound is performed, showing a hypoechoic irregular mass with shadowing at 12 o'clock position 1-2 cm from the nipple measuring approximately 1.3 x 1.6 x 0.7 cm. Ultrasound of the region of patient concern discovered today while she was here in the office at 7:30 position shows normal fatty tissue.      12/23/2017 Procedure    Clip placement FINDINGS: Mammographic images were obtained following stereotactic guided biopsy of left breast distortion. The coil shaped biopsy marking clip is well positioned at the site of distortion in the upper slightly inner quadrant of the left breast.       12/23/2017 Pathology Results    Diagnosis Breast, left, needle core biopsy, upper inner quadrant - DENSE FIBROSIS AND PIGMENTED HISTIOCYTES CONSISTENT WITH PREVIOUS SURGICAL SITE - FOCAL FIBROCYSTIC CHANGES - NO MALIGNANCY IDENTIFIED     CURRENT THERAPY: Surveillance, Letrozole started in 08/2014 and due to poor tolerance switched to Anastrozole,  put on hold due to elevated liver enzyme since 05/21/2017 and restarted on 06/26/17   INTERVAL HISTORY: Ms. Gunnells returns for follow up as scheduled. He continues anastrozole, she is mostly compliant. She had abnormal mammogram in 12/2017 with subsequent left breast US and biopsy. Result was not cancerous. She exercises 1 hour most days of the week. Also does mind games to  strengthen her mental acuity. She was diagnosed with parkinson's per Dr. Carles Collet, was prescribed carbidopa-levodopa but prefers natural remedies instead. She had a recent GI bug with nausea, vomiting, and low grade fever but it resolved. Denies fever, chills, cough, chest pain, or dyspnea. Has intermittent ongoing tooth pain, sees a dentist. Her tongue is sore today, she reports she drinks lemon juice every morning. Chronic right foot pain. No new pain.   REVIEW OF SYSTEMS:   Constitutional: Denies fevers, chills or abnormal weight loss Eyes: Denies blurriness of vision Ears, nose, mouth, throat, and face: Denies mucositis or sore throat (+) sore tongue (+) intermittent tooth pain  Respiratory: Denies cough, dyspnea or wheezes Cardiovascular: Denies palpitation, chest discomfort or lower extremity swelling Gastrointestinal:  Denies nausea, vomiting, constipation, diarrhea, heartburn or change in bowel habits Skin: Denies abnormal skin rashes Lymphatics: Denies new lymphadenopathy or easy bruising Neurological:Denies numbness, tingling or new weaknesses (+) tremor (+) Parkinson's Disease  Behavioral/Psych: Mood is stable, no new changes  MSK: (+) chronic right foot pain  Breast: Denies changes or new lump  All other systems were reviewed with the patient and are negative.  MEDICAL HISTORY:  Past Medical History:  Diagnosis Date  . Allergy   . Arthritis   . Breast cancer ([place])   . Heart murmur   . Lupus ([place])   . Personal history of chemotherapy   . Personal history of radiation therapy   . Tremor of right hand 08/02/2017    SURGICAL HISTORY: Past Surgical History:  Procedure Laterality Date  . BREAST BIOPSY    . BREAST LUMPECTOMY Right 2015  . Broken Nose     Patient fell.  Marland Kitchen FOOT SURGERY Right 2010    I have reviewed the social history and family history with the patient and they are unchanged from previous note.  ALLERGIES:  is allergic to tetracyclines &  related.  MEDICATIONS:  Current Outpatient Medications  Medication Sig Dispense Refill  . anastrozole (ARIMIDEX) 1 MG tablet Take 1 tablet (1 mg total) daily by mouth. 90 tablet 3  . Bioflavonoid Products (ESTER-C) TABS Take 1 tablet by mouth daily. Taking AM and PM    . Biotin 10 MG TABS Take 1 tablet by mouth daily.    Marland Kitchen CALCIUM & MAGNESIUM CARBONATES PO Take 3 tablets by mouth daily.    . Cholecalciferol (VITAMIN D3) 2000 units capsule Take by mouth daily.     . Coenzyme Q10 (CO Q-10) 100 MG CAPS Take by mouth. Two caps daily    . COLLAGEN PO Take 1 each by mouth. 2 scopes in AM    . Melatonin 1 MG CAPS Take by mouth at bedtime.    . Turmeric Curcumin 500 MG CAPS Take 1 capsule by mouth daily.    . vitamin E 100 UNIT capsule Take 100 Units by mouth daily.     No current facility-administered medications for this visit.     PHYSICAL EXAMINATION:  Vitals:   03/26/18 1338  BP: (!) 150/78  Pulse: 68  Resp: 18  Temp: 98 F (36.7 C)  SpO2: 96%   Filed Weights  03/26/18 1338  Weight: 204 lb 8 oz (92.8 kg)    GENERAL:alert, no distress and comfortable SKIN: skin color, texture, turgor are normal, no rashes or significant lesions EYES: normal, Conjunctiva are pink and non-injected, sclera clear OROPHARYNX: no exudate, erythema, or ulceration. Geographic tongue   LYMPH:  no palpable cervical, supraclavicular, axillary lymphadenopathy  LUNGS: clear to auscultation with normal breathing effort HEART: regular rate & rhythm and no murmurs and no lower extremity edema ABDOMEN:abdomen soft, non-tender and normal bowel sounds Musculoskeletal:no cyanosis of digits and no clubbing  NEURO: alert & oriented x 3 with fluent speech, steady gait (+) resting tremor  BREASTS: inspection shows s/p right lumpectomy and left breast reduction. No palpable mass in left breast or axilla that I could appreciate. (+) 1 cm mass at 8:30 position in the right breast, just above surgical incision is  stable from previous exam. No right axillary adenopathy   LABORATORY DATA:  I have reviewed the data as listed CBC Latest Ref Rng & Units 03/26/2018 12/27/2017 09/16/2017  WBC 3.9 - 10.3 K/uL 5.9 6.0 6.7  Hemoglobin 11.6 - 15.9 g/dL 13.5 14.3 13.9  Hematocrit 34.8 - 46.6 % 40.4 42.7 40.5  Platelets 145 - 400 K/uL 150 152 175     CMP Latest Ref Rng & Units 03/26/2018 12/27/2017 09/16/2017  Glucose 70 - 140 mg/dL 92 104 144(H)  BUN 7 - 26 mg/dL '13 17 16  '$ Creatinine 0.60 - 1.10 mg/dL 0.89 0.96 1.10(H)  Sodium 136 - 145 mmol/L 135(L) 135(L) 139  Potassium 3.5 - 5.1 mmol/L 4.2 4.4 4.1  Chloride 98 - 109 mmol/L 104 101 102  CO2 22 - 29 mmol/L '25 26 26  '$ Calcium 8.4 - 10.4 mg/dL 9.2 9.3 9.3  Total Protein 6.4 - 8.3 g/dL 6.9 7.1 6.8  Total Bilirubin 0.2 - 1.2 mg/dL 0.4 0.4 0.4  Alkaline Phos 40 - 150 U/L 124 122 -  AST 5 - 34 U/L 29 57(H) 26  ALT 0 - 55 U/L 31 82(H) 24    RADIOGRAPHIC STUDIES: I have personally reviewed the radiological images as listed and agreed with the findings in the report. No results found.   ASSESSMENT & PLAN: 77 y.o. woman   1. Right breast cancer stage IIA, ER+/PR+/HER2-, mammaprint high risk -Ms. Mikkelson is clinically doing well. We reviewed her mammogram, [place], and bopsies from 12/2017. Lumpectomy changes were noted in the right breast, no other findings suspicious for malignancy were noted. There was an irregular hypoechoic mass with shadowing at the 12 o'clock position, biopsy showed fibrous changes consistent with previous surgical site, no malignancy was identified. MRI was scheduled but patient cancelled due to financial resasons. Her physical exam is unchanged. CBC and CMP are normal, except mild hyponatremia. No clinical concern for recurrence. Will continue breast cancer surveillance. Continue daily anastrozole. F/u with Dr. Burr Medico in 6 months   2. Transaminitis  -Resolved.   3. Bone health  -Continues exercise and daily calcium plus vitamin D. I recommend  DEXA, but she declined. I reviewed the effects AI can have on the bones. She understands   4. Lupus   5. Parkinson's Disease  -Followed by Dr. Carles Collet, patient declined carbidopa-levodopa; opted for natural remedies including physical and mental exercise  PLAN -Labs, imaging, and path reviewed -Continue breast cancer surveillance; lab and f/u with Dr. Burr Medico in 6 months  -Anastrozole daily  -Continue exercise, calcium, vitamin D for bone health -F/u with Dr. Carles Collet for PD  All questions were answered. The  patient knows to call the clinic with any problems, questions or concerns. No barriers to learning was detected. I spent 20 minutes counseling the patient face to face. The total time spent in the appointment was 25 minutes and more than 50% was on counseling and review of test results     Alla Feeling, NP 03/26/18

## 2018-03-26 ENCOUNTER — Inpatient Hospital Stay (HOSPITAL_BASED_OUTPATIENT_CLINIC_OR_DEPARTMENT_OTHER): Payer: Medicare Other | Admitting: Nurse Practitioner

## 2018-03-26 ENCOUNTER — Encounter: Payer: Self-pay | Admitting: Nurse Practitioner

## 2018-03-26 ENCOUNTER — Inpatient Hospital Stay: Payer: Medicare Other | Attending: Hematology

## 2018-03-26 ENCOUNTER — Telehealth: Payer: Self-pay | Admitting: Hematology

## 2018-03-26 VITALS — BP 150/78 | HR 68 | Temp 98.0°F | Resp 18 | Ht 63.0 in | Wt 204.5 lb

## 2018-03-26 DIAGNOSIS — Z79811 Long term (current) use of aromatase inhibitors: Secondary | ICD-10-CM

## 2018-03-26 DIAGNOSIS — Z170 Estrogen receptor positive status [ER+]: Secondary | ICD-10-CM | POA: Insufficient documentation

## 2018-03-26 DIAGNOSIS — M329 Systemic lupus erythematosus, unspecified: Secondary | ICD-10-CM

## 2018-03-26 DIAGNOSIS — C50911 Malignant neoplasm of unspecified site of right female breast: Secondary | ICD-10-CM | POA: Diagnosis not present

## 2018-03-26 DIAGNOSIS — G20 Parkinson's disease: Secondary | ICD-10-CM | POA: Insufficient documentation

## 2018-03-26 DIAGNOSIS — R740 Nonspecific elevation of levels of transaminase and lactic acid dehydrogenase [LDH]: Secondary | ICD-10-CM

## 2018-03-26 LAB — CBC WITH DIFFERENTIAL/PLATELET
BASOS ABS: 0.1 10*3/uL (ref 0.0–0.1)
Basophils Relative: 1 %
Eosinophils Absolute: 0.1 10*3/uL (ref 0.0–0.5)
Eosinophils Relative: 2 %
HEMATOCRIT: 40.4 % (ref 34.8–46.6)
HEMOGLOBIN: 13.5 g/dL (ref 11.6–15.9)
LYMPHS PCT: 22 %
Lymphs Abs: 1.3 10*3/uL (ref 0.9–3.3)
MCH: 30.1 pg (ref 25.1–34.0)
MCHC: 33.5 g/dL (ref 31.5–36.0)
MCV: 89.7 fL (ref 79.5–101.0)
Monocytes Absolute: 0.7 10*3/uL (ref 0.1–0.9)
Monocytes Relative: 12 %
NEUTROS ABS: 3.6 10*3/uL (ref 1.5–6.5)
Neutrophils Relative %: 63 %
PLATELETS: 150 10*3/uL (ref 145–400)
RBC: 4.5 MIL/uL (ref 3.70–5.45)
RDW: 13.8 % (ref 11.2–14.5)
WBC: 5.9 10*3/uL (ref 3.9–10.3)

## 2018-03-26 LAB — COMPREHENSIVE METABOLIC PANEL
ALBUMIN: 3.6 g/dL (ref 3.5–5.0)
ALT: 31 U/L (ref 0–55)
ANION GAP: 6 (ref 3–11)
AST: 29 U/L (ref 5–34)
Alkaline Phosphatase: 124 U/L (ref 40–150)
BILIRUBIN TOTAL: 0.4 mg/dL (ref 0.2–1.2)
BUN: 13 mg/dL (ref 7–26)
CO2: 25 mmol/L (ref 22–29)
Calcium: 9.2 mg/dL (ref 8.4–10.4)
Chloride: 104 mmol/L (ref 98–109)
Creatinine, Ser: 0.89 mg/dL (ref 0.60–1.10)
GFR calc Af Amer: >60 mL/min (ref >60)
Glucose, Bld: 92 mg/dL (ref 70–140)
POTASSIUM: 4.2 mmol/L (ref 3.5–5.1)
Sodium: 135 mmol/L — ABNORMAL LOW (ref 136–145)
Total Protein: 6.9 g/dL (ref 6.4–8.3)

## 2018-03-26 NOTE — Telephone Encounter (Signed)
Appointments scheduled AVS/Calendar printed per 5/8 los °

## 2018-03-28 ENCOUNTER — Ambulatory Visit: Payer: Medicare Other | Admitting: Hematology

## 2018-03-28 ENCOUNTER — Other Ambulatory Visit: Payer: Medicare Other

## 2018-04-01 ENCOUNTER — Ambulatory Visit: Payer: Medicare Other | Admitting: Neurology

## 2018-04-21 ENCOUNTER — Encounter: Payer: Self-pay | Admitting: Hematology

## 2018-04-22 ENCOUNTER — Telehealth: Payer: Self-pay

## 2018-04-22 NOTE — Telephone Encounter (Signed)
Patient had sent my chart message inquiring about 2 different supplements whether it would be safe for her to take.  Per Dr. Burr Medico called our pharmacy with inquiry on Alpha Brain nootropic and Redervatrol.  They will get back to [place] so we can let the patient know.

## 2018-04-25 ENCOUNTER — Encounter: Payer: Self-pay | Admitting: Nurse Practitioner

## 2018-04-25 ENCOUNTER — Telehealth: Payer: Self-pay

## 2018-04-25 NOTE — Telephone Encounter (Signed)
Patient oncologist's requested pharmacy look into interactions with anastrozole and Alpha Brain nootropic for cognitive health and Reservatrol for breast cancer risk reduction. We conducted an interaction check of anastrozole with two supplements patient was hoping to initiate. Informed patient that there was conflicting evidence about the effects Reservatrol has on estrogen. Additionally, possible interactions were found with some of the ingredients in Alpha Brain and anastrozole. Therefore, recommended to patient that she avoid both of these supplements due to safety concerns. She agreed to consider these concerns and will inform team if she decides to initiate either.  Additionally, we told her if she wanted to try Vitamin B6 for cognitive health purposes, this would not have drug interactions like Alpha Brain nootropic.   Sarah Mata was appreciative for the call and understood the plan.  Willia Craze, Pharmacy Student

## 2018-04-25 NOTE — Progress Notes (Signed)
Regarding patient's inquiry about alpha brain nootropic and reservatrol supplements, Willia Craze, PharmD student has previously researched the topic and communicated to the patient the following:   "conflicting evidence about the effects reservatrol had on estrogen, so I recommended she avoid this supplement. Additionally, I looked for any interactions with the ingredients in the nootropic with anastrozole. There were no known interactions; however, due to the CYP interactions from two of the ingredients (Cat's Claw and Vinpocetine) I advised against it. Patient understood that these supplements may not be safe, so she agreed to consider these concerns before deciding on whether or not to initiate. She agreed to let the team know if she did decide to start them."

## 2018-04-29 NOTE — Telephone Encounter (Signed)
I agree with Rebecca's assessment and plan as stated in her note.   Demetrius Charity, PharmD. [facility] PGY2 Oncology Pharmacy Resident  Pharmacy Phone: [phone] 04/29/2018

## 2018-05-20 DIAGNOSIS — L84 Corns and callosities: Secondary | ICD-10-CM | POA: Diagnosis not present

## 2018-05-20 DIAGNOSIS — I739 Peripheral vascular disease, unspecified: Secondary | ICD-10-CM | POA: Diagnosis not present

## 2018-06-30 NOTE — Progress Notes (Signed)
Sarah Mata was seen today in the movement disorders clinic for neurologic consultation at the request of Emeterio Reeve, DO.  The consultation is for the evaluation of tremor.  Pt has been under the care of Dr. Rexene Alberts.  The records that were made available to me were reviewed.  Patient saw Dr. Rexene Alberts on October 02, 2017.  She was seen for tremor.  Dr. Rexene Alberts felt that she had mild parkinsonism, but not Parkinson's disease at this point.  She recommended an MRI of the brain.  This was completed on October 26, 2017.  I have had the opportunity to review this.  There is generalized atrophy, and perhaps more so on the left hemisphere than right.  Pt reports tremor since 02/2016.  She initially thought that it was from the stress of the move (moved from [facility]     Tremor: Yes.     How long has it been going on? 02/2016, pt wonders if sx's due to fall years ago in which she hit face  At rest or with activation?  With rest  Fam hx of tremor?  No.  Located where?  R arm  Affected by caffeine:  Yes.    Affected by alcohol:  Doesn't drink  Affected by stress:  No. but is worse in the cold  Specific Symptoms:  Voice: always soft spoken Sleep: trouble getting and staying asleep, chronic  Vivid Dreams:  No.  Acting out dreams:  No. - doesn't have bed partner Wet Pillows: occasionally Postural symptoms:  No., not usually  Falls?  Yes.  , only one in 2016 Bradykinesia symptoms: difficulty getting out of a chair (she blames on lupus); no shuffling Loss of smell:  No. Loss of taste:  No. Urinary Incontinence:  Yes.  , mild and just uses undergarment for leak.  Urinary urgency Difficulty Swallowing:  No. Handwriting, micrographia: No. Trouble with ADL's:  No.  Trouble buttoning clothing: No. Depression:  No. Memory changes:  Yes.  , minor, short term Hallucinations:  No. but at the end of the visit she goes into detail about what she describes as auditory hallucinations.  These mostly consists  of banging noises.  She described an incident about 3 weeks ago in which she was driving her car and heard a loud banging noise/squealing and she felt that her car could have become "demonically possessed."  She turned off the car and the noise away and then turned back on her car and it came back on.  She did this for several times.  She ultimately had her car take it and she was told that there was a problem with belt, but she is not sure that is actually notes that she heard.  She has had several incidents in her home where she hears banging noises.  Never has heard voices.  visual distortions: No. N/V:  No. Lightheaded:  No.  Syncope: No. 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  She refused medication last visit and wanted to rely on supplements instead.  However, she called only 2 days after our last visit and did ask for a prescription for carbidopa/levodopa.  Only one day later she called back and stated that she stopped the medication because she took 1/2 tablet once and decided the medication "was not for her."  She admitted that she had no side effects and wanted to try "natural  remedies."  She met with our social worker since last visit.  Those notes are reviewed.  She has been stable since our last visit.  Denies falls.  Only noting tremor when cold or anxious.  She will note some tremor during church.  She states that she was good with exercise after last visit but "I tapered off and then haven't gone for a month and I'm not happy with that."  Asks me about supplements like niacin - "I did reading on it and it had good points."  Worried about her sister "taking over things."      ALLERGIES:   Allergies  Allergen Reactions  . Tetracyclines & Related Other (See Comments)    Stomach burning    CURRENT MEDICATIONS:  Outpatient Encounter Medications as of 07/01/2018    Medication Sig  . anastrozole (ARIMIDEX) 1 MG tablet Take 1 tablet (1 mg total) daily by mouth.  Marland Kitchen Bioflavonoid Products (ESTER-C) TABS Take 1 tablet by mouth daily. Taking AM and PM  . Biotin 10 MG TABS Take 1 tablet by mouth daily.  Marland Kitchen CALCIUM & MAGNESIUM CARBONATES PO Take 3 tablets by mouth daily.  . Cholecalciferol (VITAMIN D3) 2000 units capsule Take by mouth daily.   . Coenzyme Q10 (CO Q-10) 100 MG CAPS Take by mouth. Two caps daily  . Melatonin 1 MG CAPS Take by mouth at bedtime.  Marland Kitchen UBIQUINOL PO Take by mouth.  Marland Kitchen UNABLE TO FIND Lion's mane  . vitamin E 100 UNIT capsule Take 100 Units by mouth daily.  . [DISCONTINUED] COLLAGEN PO Take 1 each by mouth. 2 scopes in AM  . [DISCONTINUED] Turmeric Curcumin 500 MG CAPS Take 1 capsule by mouth daily.   No facility-administered encounter medications on file as of 07/01/2018.     PAST MEDICAL HISTORY:   Past Medical History:  Diagnosis Date  . Allergy   . Arthritis   . Breast cancer ([place])   . Heart murmur   . Lupus ([place])   . Personal history of chemotherapy   . Personal history of radiation therapy   . Tremor of right hand 08/02/2017    PAST SURGICAL HISTORY:   Past Surgical History:  Procedure Laterality Date  . BREAST BIOPSY    . BREAST LUMPECTOMY Right 2015  . Broken Nose     Patient fell.  Marland Kitchen FOOT SURGERY Right 2010    SOCIAL HISTORY:   Social History   Socioeconomic History  . Marital status: Widowed    Spouse name: Not on file  . Number of children: Not on file  . Years of education: Not on file  . Highest education level: Not on file  Occupational History  . 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 Financial resource strain: Not on file  . Food insecurity:    Worry: Not on file    Inability: Not on file  . Transportation needs:    Medical: Not on file    Non-medical: Not on file  Tobacco Use  . Smoking status: Never Smoker  . Smokeless tobacco:  Never Used  Substance and Sexual Activity  . Alcohol use: Yes    Comment: ONCE A MONTH  . Drug use: No  . Sexual activity: Not Currently    Comment: 1st intercourse- 24, partners- 4, widow  Lifestyle  . Physical activity:    Days per week: Not on file    Minutes per session: Not on  file  . Stress: Not on file  Relationships  . Social connections:    Talks on phone: Not on file    Gets together: Not on file    Attends religious service: Not on file    Active member of club or organization: Not on file    Attends meetings of clubs or organizations: Not on file    Relationship status: Not on file  . Intimate partner violence:    Fear of current or ex partner: Not on file    Emotionally abused: Not on file    Physically abused: Not on file    Forced sexual activity: Not on file  Other Topics Concern  . Not on file  Social History Narrative  . Not on file    FAMILY HISTORY:   Family Status  Relation Name Status  . Mother  Deceased  . Sister  Alive  . Father  Deceased  . Sister  Alive  . Brother  Alive  . Neg Hx  (Not Specified)    ROS:  A complete 10 system review of systems was obtained and was unremarkable apart from what is mentioned above.  PHYSICAL EXAMINATION:    VITALS:   Vitals:   07/01/18 1526  BP: 132/76  Pulse: 76  SpO2: 90%  Weight: 201 lb (91.2 kg)  Height: '5\' 3"'$  (1.6 m)   GEN:  The patient appears stated age and is in NAD. HEENT:  Normocephalic, atraumatic.  The mucous membranes are moist. The superficial temporal arteries are without ropiness or tenderness. CV:  RRR Lungs:  CTAB Neck/HEME:  There are no carotid bruits bilaterally.  Neurological examination:  Orientation: The patient is alert and oriented x3. Cranial nerves: There is good facial symmetry. The speech is fluent and clear. Soft palate rises symmetrically and there is no tongue deviation. Hearing is intact to conversational tone. Sensation: Sensation is intact to light touch  throughout Motor: Strength is 5/5 in the bilateral upper and lower extremities.   Shoulder shrug is equal and symmetric.  There is no pronator drift.  Movement examination: Tone: There is increased tone in the RUE, overall mild.  There is normal tone in the LUE/bilateral LE.   Abnormal movements: There is a RUE resting tremor that increases with distraction and ambulation Coordination:  There is minimal decremation with RAM's, seen with hand opening and closing on the right Gait and Station: The patient has no difficulty arising out of a deep-seated chair without the use of the hands. The patient's stride length is normal.  She has re-emergent tremor of the RUE.  The patient has a negative pull test.      ASSESSMENT/PLAN:  1.  Idiopathic Parkinson's disease.  The patient has tremor, bradykinesia, rigidity and mild postural instability.  -We discussed the diagnosis as well as pathophysiology of the disease.  We discussed treatment options as well as prognostic indicators.  Patient education was provided.  -We discussed that it used to be thought that levodopa would increase risk of melanoma but now it is believed that Parkinsons itself likely increases risk of melanoma. she is to get regular skin checks.  Reminded her about that today.  She used to have a dermatologist but "I didn't like her."  Encouraged her to find another  -Reiterated to the patient about the supplements that she is spending a lot of money on (and she has finance concerns) are likely not helpful for Parkinson's disease.  She wants to take niacin for PD.  Explained that this is not shown to be beneficial.    -talked about proper diet.  She is eating lots of sweets.  Encouraged Mediterranean diet  -again, reiterated importance of exercise.  It is the once thing that she can do for her PD without adding more med.  She is resistant.  -invited her to PARTS progam  -refused medication.  As above, tried 1/2 tablet one time of  carbidopa/levodopa and decided medication wasn't for her.  No SE per patient today. 2.  insomnia  -told to try melatonin.  She has previously tried and didn't find helpful  -doesn't want RX medication.  Exercise could help 3.  F/u 6 months.  Offered social work today but she doesn't feel need for that service.  She does have social workers Psychologist, counselling.  Much greater than 50% of this visit was spent in counseling and coordinating care.  Total face to face time:  25 min   Cc:  Emeterio Reeve, DO

## 2018-07-01 ENCOUNTER — Ambulatory Visit (INDEPENDENT_AMBULATORY_CARE_PROVIDER_SITE_OTHER): Payer: Medicare Other | Admitting: Neurology

## 2018-07-01 ENCOUNTER — Encounter: Payer: Self-pay | Admitting: Neurology

## 2018-07-01 VITALS — BP 132/76 | HR 76 | Ht 63.0 in | Wt 201.0 lb

## 2018-07-01 DIAGNOSIS — G20 Parkinson's disease: Secondary | ICD-10-CM | POA: Diagnosis not present

## 2018-07-01 NOTE — Patient Instructions (Signed)
1.  Make sure that you have a dermatology appointment. 2.  Use sunscreen such as Tizo or EltaMD

## 2018-08-14 DIAGNOSIS — L97511 Non-pressure chronic ulcer of other part of right foot limited to breakdown of skin: Secondary | ICD-10-CM | POA: Diagnosis not present

## 2018-09-26 ENCOUNTER — Inpatient Hospital Stay (HOSPITAL_BASED_OUTPATIENT_CLINIC_OR_DEPARTMENT_OTHER): Payer: Medicare Other | Admitting: Hematology

## 2018-09-26 ENCOUNTER — Telehealth: Payer: Self-pay | Admitting: Hematology

## 2018-09-26 ENCOUNTER — Encounter: Payer: Self-pay | Admitting: Hematology

## 2018-09-26 ENCOUNTER — Inpatient Hospital Stay: Payer: Medicare Other | Attending: Hematology

## 2018-09-26 VITALS — BP 153/89 | HR 75 | Temp 97.6°F | Resp 17 | Ht 63.0 in | Wt 204.9 lb

## 2018-09-26 DIAGNOSIS — E559 Vitamin D deficiency, unspecified: Secondary | ICD-10-CM

## 2018-09-26 DIAGNOSIS — Z170 Estrogen receptor positive status [ER+]: Secondary | ICD-10-CM | POA: Insufficient documentation

## 2018-09-26 DIAGNOSIS — C50911 Malignant neoplasm of unspecified site of right female breast: Secondary | ICD-10-CM | POA: Insufficient documentation

## 2018-09-26 DIAGNOSIS — M329 Systemic lupus erythematosus, unspecified: Secondary | ICD-10-CM | POA: Diagnosis not present

## 2018-09-26 DIAGNOSIS — Z79811 Long term (current) use of aromatase inhibitors: Secondary | ICD-10-CM | POA: Insufficient documentation

## 2018-09-26 DIAGNOSIS — E2839 Other primary ovarian failure: Secondary | ICD-10-CM

## 2018-09-26 DIAGNOSIS — G20 Parkinson's disease: Secondary | ICD-10-CM

## 2018-09-26 LAB — CBC WITH DIFFERENTIAL/PLATELET
ABS IMMATURE GRANULOCYTES: 0.02 10*3/uL (ref 0.00–0.07)
BASOS ABS: 0.1 10*3/uL (ref 0.0–0.1)
Basophils Relative: 1 %
Eosinophils Absolute: 0.2 10*3/uL (ref 0.0–0.5)
Eosinophils Relative: 4 %
HEMATOCRIT: 44.2 % (ref 36.0–46.0)
HEMOGLOBIN: 14.4 g/dL (ref 12.0–15.0)
Immature Granulocytes: 0 %
LYMPHS ABS: 1.3 10*3/uL (ref 0.7–4.0)
LYMPHS PCT: 24 %
MCH: 29.5 pg (ref 26.0–34.0)
MCHC: 32.6 g/dL (ref 30.0–36.0)
MCV: 90.6 fL (ref 80.0–100.0)
Monocytes Absolute: 0.8 10*3/uL (ref 0.1–1.0)
Monocytes Relative: 15 %
NEUTROS ABS: 3.2 10*3/uL (ref 1.7–7.7)
NRBC: 0 % (ref 0.0–0.2)
Neutrophils Relative %: 56 %
Platelets: 162 10*3/uL (ref 150–400)
RBC: 4.88 MIL/uL (ref 3.87–5.11)
RDW: 12.9 % (ref 11.5–15.5)
WBC: 5.7 10*3/uL (ref 4.0–10.5)

## 2018-09-26 LAB — COMPREHENSIVE METABOLIC PANEL
ALK PHOS: 128 U/L — AB (ref 38–126)
ALT: 19 U/L (ref 0–44)
AST: 21 U/L (ref 15–41)
Albumin: 3.6 g/dL (ref 3.5–5.0)
Anion gap: 7 (ref 5–15)
BUN: 24 mg/dL — AB (ref 8–23)
CALCIUM: 9.7 mg/dL (ref 8.9–10.3)
CO2: 29 mmol/L (ref 22–32)
CREATININE: 1.04 mg/dL — AB (ref 0.44–1.00)
Chloride: 104 mmol/L (ref 98–111)
GFR calc Af Amer: 60 mL/min — ABNORMAL LOW (ref >60)
GFR, EST NON AFRICAN AMERICAN: 52 mL/min — AB (ref >60)
Glucose, Bld: 89 mg/dL (ref 70–99)
Potassium: 4.6 mmol/L (ref 3.5–5.1)
Sodium: 140 mmol/L (ref 135–145)
Total Bilirubin: 0.4 mg/dL (ref 0.3–1.2)
Total Protein: 7.4 g/dL (ref 6.5–8.1)

## 2018-09-26 MED ORDER — ANASTROZOLE 1 MG PO TABS: 1.0000 mg | ORAL_TABLET | Freq: Every day | ORAL | 3 refills | Status: DC

## 2018-09-26 NOTE — Progress Notes (Signed)
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 Grade 3, Ki-67 20%.    03/2014 Miscellaneous    mammaprint reviewed high-risk luminal type    04/2014 -  Radiation Therapy    Patient underwent radiation to the right chest wall    08/2014 -  Anti-estrogen oral therapy    Letrozole 2.5 mg started in 08/2014 and due to poor tolerance switched to Anastrozole 1.0 mg, put on hold due to elevated liver enzyme since 05/21/2017 and restarted on 06/26/17    08/06/2017 Pathology Results    Diagnosis, colonoscopy 1. Surgical [P], right colon biopsies - COLONIC MUCOSA WITH BENIGN LYMPHOID AGGREGATES. - NO MICROSCOPIC COLITIS, ACTIVE INFLAMMATION, CHRONIC CHANGES OR GRANULOMAS. 2. Surgical [P], left colon biopsies - COLONIC MUCOSA WITH BENIGN LYMPHOID AGGREGATES. - NO MICROSCOPIC COLITIS, ACTIVE INFLAMMATION, CHRONIC CHANGES OR GRANULOMAS.    08/06/2017 Procedure    Colonoscopy by Dr. Silverio Decamp on 08/06/17 IMPRESSION - Diverticulosis in the sigmoid colon and in the descending colon. - Non-bleeding  internal hemorrhoids. - The entire examined colon is normal. Biopsied.     12/20/2017 Mammogram    There are stable lumpectomy changes in the upper-outer right breast. No mass, nonsurgical distortion, or suspicious microcalcification is identified on the right to suggest malignancy.  There is subtle palpable thickening in the 12 o'clock to 1 o'clock left breast just superior to the nipple.   IMPRESSION: Architectural distortion in the 12 o'clock region of the left breast. Malignancy, postsurgical scarring, or complex sclerosing lesion are considerations.    12/20/2017 Breast US    Targeted ultrasound is performed, showing a hypoechoic irregular mass with shadowing at 12 o'clock position 1-2 cm from the nipple measuring approximately 1.3 x 1.6 x 0.7 cm. Ultrasound of the region of patient concern discovered today while she was here in the office at 7:30 position shows normal fatty tissue.    12/23/2017 Procedure    Clip placement FINDINGS: Mammographic images were obtained following stereotactic guided biopsy of left breast distortion. The coil shaped biopsy marking clip is well positioned at the site of distortion in the upper slightly inner quadrant of the left breast.     12/23/2017 Pathology Results    Diagnosis Breast, left, needle core biopsy, upper inner quadrant - DENSE FIBROSIS AND PIGMENTED HISTIOCYTES CONSISTENT WITH PREVIOUS SURGICAL SITE - FOCAL FIBROCYSTIC CHANGES - NO MALIGNANCY IDENTIFIED      HISTORY OF PRESENTING ILLNESS: 05/21/17 Sarah Mata is a  77 y.o. female who has recently relocated from [place] to [place]. She has a known diagnosis of right-sided breast cancer.  She presented with a palpable abnormality in the right breast which led to additional workup in March 2015. A core biopsy in April of 2015 showed an invasive ductal carcinoma grade 2, ER+/PR-/HER-2 negative. She later underwent a right lumpectomy with axillary lymph node sampling. The tumor characteristics notes a  2.5 x 2 x 2 cm tumor with evidence of lymphovascular invasion. The tumor was high-grade at grade 3 with Kli 67 index at 20%. She underwent 4 cycles of cytoxan and Taxotere with growth factor support with Neupogen which she finished in October of 2015. Post treatment, she underwent radiation therapy to the right chest wall and also was started on antiestrogen therapy with Letrozole 2.5 mg/day that she is complaint with.  She also suffers from history of anxiety.    Today, she presents to the clinic after moving to [place]. Besides hair loss and fatigue, she has no other problems with Letrozole. She has Lupus which she says might also cause her fatigue. Before moving to [place], she fell and broke her nose. She denies any other chronic conditions.   CURRENT THERAPY: Surveillance, Letrozole started in 08/2014 and due to poor tolerance switched to Anastrozole, put on hold due to elevated liver enzyme since 05/21/2017 and restarted on 06/26/17    INTERVAL HISTORY:  Sarah Mata is here for a follow-up for her right breast cancer. She was last seen by me 1 year ago. In interim she was seen by NP Laice in 03/2018. She presents to the clinic today by herself. She notes she is doing well overall. She is tolerating anastrozole well.  She notes having yellow anal discharge on her underwear. Her 2018 colonoscopy was normal according to her. She notes she had been wearing pads.  She will see a podiatrist today for a callous that has caused a hole in her foot. She was giving antibiotics to treat the infection.    She notes mosquitoes that have infested her apartment and bite her often even in her sleep.      MEDICAL HISTORY:  Past Medical History:  Diagnosis Date  . Allergy   . Arthritis   . Breast cancer ([place])   . Heart murmur   . Lupus ([place])   . Personal history of chemotherapy   . Personal history of radiation therapy   . Tremor of right hand 08/02/2017    SURGICAL HISTORY: Past Surgical History:    Procedure Laterality Date  . BREAST BIOPSY    . BREAST LUMPECTOMY Right 2015  . Broken Nose     Patient fell.  Marland Kitchen FOOT SURGERY Right 2010    SOCIAL HISTORY: Social History   Socioeconomic History  . Marital status: Widowed    Spouse name: Not on file  . Number of children: Not on file  . Years of education: Not on file  . Highest education level: Not on file  Occupational History  . 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 Financial resource strain: Not on file  . Food insecurity:    Worry: Not on file    Inability: Not on file  . Transportation needs:    Medical: Not on file    Non-medical: Not on file  Tobacco Use  . Smoking status: Never Smoker  . Smokeless tobacco: Never Used  Substance and Sexual Activity  . Alcohol use: Yes    Comment: ONCE A MONTH  . Drug use: No  . Sexual activity: Not  Currently    Comment: 1st intercourse- 24, partners- 61, widow  Lifestyle  . Physical activity:    Days per week: Not on file    Minutes per session: Not on file  . Stress: Not on file  Relationships  . Social connections:    Talks on phone: Not on file    Gets together: Not on file    Attends religious service: Not on file    Active member of club or organization: Not on file    Attends meetings of clubs or organizations: Not on file    Relationship status: Not on file  . Intimate partner violence:    Fear of current or ex partner: Not on file    Emotionally abused: Not on file    Physically abused: Not on file    Forced sexual activity: Not on file  Other Topics Concern  . Not on file  Social History Narrative  . Not on file    FAMILY HISTORY: Family History  Problem Relation Age of Onset  . Cancer Mother        breast cancer   . Breast cancer Mother   . Hypertension Mother   . Diabetes Sister   . Hypertension Sister   . Heart disease Father   . Breast cancer Sister   . Colon cancer Neg Hx   .  Esophageal cancer Neg Hx   . Pancreatic cancer Neg Hx   . Rectal cancer Neg Hx   . Stomach cancer Neg Hx     ALLERGIES:  is allergic to tetracyclines & related.  MEDICATIONS:  Current Outpatient Medications  Medication Sig Dispense Refill  . anastrozole (ARIMIDEX) 1 MG tablet Take 1 tablet (1 mg total) by mouth daily. 90 tablet 3  . Bioflavonoid Products (ESTER-C) TABS Take 1 tablet by mouth daily. Taking AM and PM    . Biotin 10 MG TABS Take 1 tablet by mouth daily.    Marland Kitchen CALCIUM & MAGNESIUM CARBONATES PO Take 3 tablets by mouth daily.    . Cholecalciferol (VITAMIN D3) 2000 units capsule Take by mouth daily.     . Coenzyme Q10 (CO Q-10) 100 MG CAPS Take by mouth. Two caps daily    . UBIQUINOL PO Take by mouth.    Marland Kitchen UNABLE TO FIND Lion's mane     No current facility-administered medications for this visit.     REVIEW OF SYSTEMS:   Constitutional: Denies fevers, chills or abnormal night sweats Eyes: Denies blurriness of vision, double vision or watery eyes Ears, nose, mouth, throat, and face: Denies mucositis or sore throat Respiratory: Denies cough, dyspnea or wheezes Cardiovascular: Denies palpitation, chest discomfort or lower extremity swelling Gastrointestinal:  Denies nausea, heartburn or change in bowel habits  Skin: Denies abnormal skin rashes skin erythema and bumps across nose and cheeks. (+) calluses and hole in right foot  Lymphatics: Denies new lymphadenopathy or easy bruising Neurological:Denies numbness, tingling or new weaknesses (+) shaking/twitching of right hand Behavioral/Psych: Mood is stable, no new changes  All other systems were reviewed with the patient and are negative.  PHYSICAL EXAMINATION:  ECOG PERFORMANCE STATUS: 1  Vitals:   09/26/18 1316  BP: (!) 153/89  Pulse: 75  Resp: 17  Temp: 97.6 F (36.4 C)  SpO2: 98%   Filed Weights   09/26/18 1316  Weight: 204 lb 14.4 oz (92.9 kg)    GENERAL:alert, no distress and comfortable SKIN: skin  color, texture, turgor are normal, no rashes or significant lesions (+)  skin erythema and bumps across nose and cheeks. (+) Her right foot  EYES: normal, conjunctiva are pink and non-injected, sclera clear OROPHARYNX:no exudate, no erythema and lips, buccal mucosa, and tongue normal  NECK: supple, thyroid normal size, non-tender, without nodularity LYMPH:  no palpable lymphadenopathy in the cervical, axillary or inguinal LUNGS: clear to auscultation and percussion with normal breathing effort HEART: regular rate & rhythm and no murmurs and no lower extremity edema ABDOMEN:abdomen soft, non-tender and normal bowel sounds, no hepatomegaly  Musculoskeletal:no cyanosis of digits and no clubbing  PSYCH: alert & oriented x 3 with fluent speech NEURO: no focal motor/sensory deficits Breasts: Breast inspection showed them to be symmetrical with no nipple discharge (+) s/p Surgical scar in the right breast and axilla has healed well. Palpation of the breasts and axilla revealed no obvious mass that I could appreciate, except surgical scar and a now smaller 19m mass above the surgical scar of right breast, at 8:30 position, 2 cm from the nipple (+) left breast s/p breast reduction: no palpable mass or adenopathy.    LABORATORY DATA:  I have reviewed the data as listed CBC Latest Ref Rng & Units 09/26/2018 03/26/2018 12/27/2017  WBC 4.0 - 10.5 K/uL 5.7 5.9 6.0  Hemoglobin 12.0 - 15.0 g/dL 14.4 13.5 14.3  Hematocrit 36.0 - 46.0 % 44.2 40.4 42.7  Platelets 150 - 400 K/uL 162 150 152   CMP Latest Ref Rng & Units 09/26/2018 03/26/2018 12/27/2017  Glucose 70 - 99 mg/dL 89 92 104  BUN 8 - 23 mg/dL 24(H) 13 17  Creatinine 0.44 - 1.00 mg/dL 1.04(H) 0.89 0.96  Sodium 135 - 145 mmol/L 140 135(L) 135(L)  Potassium 3.5 - 5.1 mmol/L 4.6 4.2 4.4  Chloride 98 - 111 mmol/L 104 104 101  CO2 22 - 32 mmol/L '29 25 26  '$ Calcium 8.9 - 10.3 mg/dL 9.7 9.2 9.3  Total Protein 6.5 - 8.1 g/dL 7.4 6.9 7.1  Total Bilirubin 0.3 - 1.2  mg/dL 0.4 0.4 0.4  Alkaline Phos 38 - 126 U/L 128(H) 124 122  AST 15 - 41 U/L 21 29 57(H)  ALT 0 - 44 U/L 19 31 82(H)   PROCEDURE  Colonoscopy by Dr. NSilverio Decampon 08/06/17 IMPRESSION - Diverticulosis in the sigmoid colon and in the descending colon. - Non-bleeding internal hemorrhoids. - The entire examined colon is normal. Biopsied.  RADIOGRAPHIC STUDIES: I have personally reviewed the radiological images as listed and agreed with the findings in the report. No results found.    ASSESSMENT & PLAN: 77y.o. woman   1. Right breast cancer stage IIA, ER+/PR+/HER2-, mammaprint high risk -I previously reviewed her outside medical records extensively, and confirmed the key findings with patient.  -She is status post lumpectomy, adjuvant chemotherapy and irradiation,started adjuvant letrozole in 08/2014, tolerating well overall. -We previously discussed the risk of cancer recurrence. She has high risk for recurrence based on the mammaprint result  -I recommend her to continue adjuvant antiestrogen therapy. Due to shaking of right hand, she is very concerned this could be side effect of the letrozole. I recommended her to switch to Anastrozole '1mg'$  to take once daily in 05/2107. Anastrozole was temporarily held due to her abnormal liver functions. -due to her persistently elevated liver enzymes, I obtain a CT Abdomen which was negative for metastasis.   -She was evaluated by GI and colonoscopy was unremarkable.  After GI evaluation, she has restarted anastrozole -We reviewed her mammogram, [place] and biopsies from 12/2017. Lumpectomy changes were  noted in the right breast, no other findings suspicious for malignancy were noted. There was an irregular hypoechoic mass with shadowing at the 12 o'clock position, biopsy showed fibrous changes consistent with previous surgical site, no malignancy was identified. MRI was scheduled but patient cancelled due to financial reasons -She is clinically doing well.  Lab reviewed. Her physical exam was unremarkable. There is no clinical concern for recurrence. -Next mammogram 12/2018 -Continue anastrozole, refilled today  -F/u in 6 months   2. Transaminates -this was discovered on routine lab work on 05/21/2017 -CT abdomen was negative  -Her husband had chronic hepatitis, and passed away in 01/26/2011, I recommend her to check for hepB and C -autoimmune hepatitis is also possible due to her lupus.  -she had liver work up by Dr. Silverio Decamp -She had a normal CT and colonoscopy  -Currently resolved    3. Bone health -I previously discussed the potential osteopenia and osteoporosis from aromatase inhibitor -She is on calcium and vitamin D -She is due for bone density scan, she will have one same day as next mammogram in 12/2018  4. Lupus -She will continue f/u with Dr. Estanislado Pandy    5. Parkinson's Disease  -Followed by Dr. Carles Collet, patient declined carbidopa-levodopa; opted for natural remedies including physical and mental exercise -She is currently on observations   6. Anal discharge  -She notes yellow discharge from anus which has been going on for a year. Her 01-25-2017 colonoscopy was benign -she declined rectal exam  -Will monitor   PLAN Continue anastrozole, refilled today  Mammogram and DEXA in 12/2018 Lab and f/u in 6 months      Orders Placed This Encounter  Procedures  . MM DIAG BREAST TOMO BILATERAL    Standing Status:   Future    Standing Expiration Date:   09/27/2019    Order Specific Question:   Reason for Exam (SYMPTOM  OR DIAGNOSIS REQUIRED)    Answer:   screening    Order Specific Question:   Preferred imaging location?    Answer:   [facility]  . DG Bone Density    Standing Status:   Future    Standing Expiration Date:   09/26/2019    Order Specific Question:   Reason for Exam (SYMPTOM  OR DIAGNOSIS REQUIRED)    Answer:   screening    Order Specific Question:   Preferred imaging location?    Answer:   [facility]    All  questions were answered. The patient knows to call the clinic with any problems, questions or concerns.  I spent 25 minutes counseling the patient face to face. The total time spent in the appointment was 30 minutes and more than 50% was on counseling.  Oneal Deputy, am acting as scribe for Truitt Merle, MD.   I have reviewed the above documentation for accuracy and completeness, and I agree with the above.     Truitt Merle, MD 09/26/2018

## 2018-09-26 NOTE — Telephone Encounter (Signed)
Gave pt avs and calendar  °

## 2018-09-27 ENCOUNTER — Encounter: Payer: Self-pay | Admitting: Hematology

## 2018-09-27 LAB — VITAMIN D 25 HYDROXY (VIT D DEFICIENCY, FRACTURES): Vit D, 25-Hydroxy: 27.5 ng/mL — ABNORMAL LOW (ref 30.0–100.0)

## 2018-09-27 LAB — CANCER ANTIGEN 27.29: CA 27.29: 58.9 U/mL — ABNORMAL HIGH (ref 0.0–38.6)

## 2018-09-29 DIAGNOSIS — M71571 Other bursitis, not elsewhere classified, right ankle and foot: Secondary | ICD-10-CM | POA: Diagnosis not present

## 2018-09-29 DIAGNOSIS — L600 Ingrowing nail: Secondary | ICD-10-CM | POA: Diagnosis not present

## 2018-09-30 ENCOUNTER — Telehealth: Payer: Self-pay

## 2018-09-30 NOTE — Telephone Encounter (Signed)
-----   Message from Truitt Merle, MD sent at 09/27/2018 10:42 PM EST ----- Please let pt know the lab results. Vitd level is still low, I recommend her to increase Vit D supplement from 2000u to 5000u daily, OTC. Her tumor marker CA27.29 is slightly elevated, but stable overall, likely non-specific, will continue monitoring.   Truitt Merle  09/27/2018

## 2018-09-30 NOTE — Telephone Encounter (Signed)
Per Dr. Burr Medico left voice message for patient regarding lab results.  Informed her Vitamin D level is still low, recommended she increase her OTC Vit D supplement from 2000u daily to 5000u daily.  Her tumor marker is slightly elevated but stable overall, will continue to monitor this.

## 2018-10-05 ENCOUNTER — Encounter: Payer: Self-pay | Admitting: Hematology

## 2018-10-17 DIAGNOSIS — R0602 Shortness of breath: Secondary | ICD-10-CM | POA: Diagnosis not present

## 2018-10-17 DIAGNOSIS — R05 Cough: Secondary | ICD-10-CM | POA: Diagnosis not present

## 2018-10-21 DIAGNOSIS — M329 Systemic lupus erythematosus, unspecified: Secondary | ICD-10-CM | POA: Diagnosis not present

## 2018-10-21 DIAGNOSIS — G20 Parkinson's disease: Secondary | ICD-10-CM | POA: Diagnosis not present

## 2018-10-21 DIAGNOSIS — J189 Pneumonia, unspecified organism: Secondary | ICD-10-CM | POA: Diagnosis not present

## 2018-10-21 DIAGNOSIS — C50919 Malignant neoplasm of unspecified site of unspecified female breast: Secondary | ICD-10-CM | POA: Diagnosis not present

## 2018-11-21 ENCOUNTER — Other Ambulatory Visit: Payer: Self-pay

## 2018-11-21 DIAGNOSIS — C50911 Malignant neoplasm of unspecified site of right female breast: Secondary | ICD-10-CM

## 2018-11-21 MED ORDER — ANASTROZOLE 1 MG PO TABS: 1.0000 mg | ORAL_TABLET | Freq: Every day | ORAL | 3 refills | Status: DC

## 2018-11-21 NOTE — Telephone Encounter (Signed)
Patient called requested refill on Anastrozole - 90 day supply be sent to [facility], [place], this has been done.

## 2018-11-26 ENCOUNTER — Telehealth: Payer: Self-pay | Admitting: *Deleted

## 2018-11-26 NOTE — Telephone Encounter (Signed)
Medical records faxed to [facility]; RID [id number]

## 2018-11-27 DIAGNOSIS — Z23 Encounter for immunization: Secondary | ICD-10-CM | POA: Diagnosis not present

## 2018-12-01 ENCOUNTER — Encounter: Payer: Self-pay | Admitting: Hematology

## 2018-12-10 DIAGNOSIS — M329 Systemic lupus erythematosus, unspecified: Secondary | ICD-10-CM | POA: Diagnosis not present

## 2018-12-10 DIAGNOSIS — C50919 Malignant neoplasm of unspecified site of unspecified female breast: Secondary | ICD-10-CM | POA: Diagnosis not present

## 2018-12-10 DIAGNOSIS — G20 Parkinson's disease: Secondary | ICD-10-CM | POA: Diagnosis not present

## 2018-12-10 DIAGNOSIS — L719 Rosacea, unspecified: Secondary | ICD-10-CM | POA: Diagnosis not present

## 2018-12-10 DIAGNOSIS — N189 Chronic kidney disease, unspecified: Secondary | ICD-10-CM | POA: Diagnosis not present

## 2018-12-10 DIAGNOSIS — I700 Atherosclerosis of aorta: Secondary | ICD-10-CM | POA: Diagnosis not present

## 2018-12-10 DIAGNOSIS — R159 Full incontinence of feces: Secondary | ICD-10-CM | POA: Diagnosis not present

## 2018-12-11 ENCOUNTER — Encounter: Payer: Self-pay | Admitting: Hematology

## 2018-12-12 ENCOUNTER — Telehealth: Payer: Self-pay | Admitting: Hematology

## 2018-12-12 NOTE — Telephone Encounter (Signed)
Faxed medical records to [facility], Release [phone]

## 2018-12-18 DIAGNOSIS — L97511 Non-pressure chronic ulcer of other part of right foot limited to breakdown of skin: Secondary | ICD-10-CM | POA: Diagnosis not present

## 2018-12-22 DIAGNOSIS — H2513 Age-related nuclear cataract, bilateral: Secondary | ICD-10-CM | POA: Diagnosis not present

## 2018-12-22 DIAGNOSIS — H11152 Pinguecula, left eye: Secondary | ICD-10-CM | POA: Diagnosis not present

## 2019-01-02 ENCOUNTER — Ambulatory Visit
Admission: RE | Admit: 2019-01-02 | Discharge: 2019-01-02 | Disposition: A | Discharge status: Home / Self Care | Payer: Medicare Other | Source: Ambulatory Visit | Attending: Hematology | Admitting: Hematology

## 2019-01-02 DIAGNOSIS — Z780 Asymptomatic menopausal state: Secondary | ICD-10-CM | POA: Diagnosis not present

## 2019-01-02 DIAGNOSIS — E2839 Other primary ovarian failure: Secondary | ICD-10-CM

## 2019-01-02 DIAGNOSIS — M810 Age-related osteoporosis without current pathological fracture: Secondary | ICD-10-CM | POA: Diagnosis not present

## 2019-01-02 DIAGNOSIS — M85851 Other specified disorders of bone density and structure, right thigh: Secondary | ICD-10-CM | POA: Diagnosis not present

## 2019-01-02 DIAGNOSIS — Z853 Personal history of malignant neoplasm of breast: Secondary | ICD-10-CM | POA: Diagnosis not present

## 2019-01-02 DIAGNOSIS — C50911 Malignant neoplasm of unspecified site of right female breast: Secondary | ICD-10-CM

## 2019-01-02 DIAGNOSIS — R922 Inconclusive mammogram: Secondary | ICD-10-CM | POA: Diagnosis not present

## 2019-01-04 ENCOUNTER — Other Ambulatory Visit: Payer: Self-pay | Admitting: Hematology

## 2019-01-04 DIAGNOSIS — E559 Vitamin D deficiency, unspecified: Secondary | ICD-10-CM

## 2019-01-05 ENCOUNTER — Telehealth: Payer: Self-pay

## 2019-01-05 DIAGNOSIS — H02055 Trichiasis without entropian left lower eyelid: Secondary | ICD-10-CM | POA: Diagnosis not present

## 2019-01-05 DIAGNOSIS — H11152 Pinguecula, left eye: Secondary | ICD-10-CM | POA: Diagnosis not present

## 2019-01-05 NOTE — Telephone Encounter (Signed)
Spoke with patient regarding her DEXA result, per Dr. Burr Medico informed her it does show she has osteoporosis, encouraged her to continue taking her calcium and Vitamin D, she verbalized an understanding.

## 2019-01-05 NOTE — Telephone Encounter (Signed)
-----   Message from Truitt Merle, MD sent at 01/04/2019 10:53 AM EST ----- Please let pt know her DEXA result, she has osteoporosis, encourage her to continue calcium and VitD, and I will discuss medicine for osteoporosis on her next visit, thanks   Truitt Merle  01/04/2019

## 2019-01-08 NOTE — Progress Notes (Signed)
Sarah Mata was seen today in the movement disorders clinic for neurologic consultation at the request of Sarah Reeve, DO.  The consultation is for the evaluation of tremor.  Pt has been under the care of Dr. Rexene Mata.  The records that were made available to me were reviewed.  Patient saw Dr. Rexene Mata on October 02, 2017.  She was seen for tremor.  Dr. Rexene Mata felt that she had mild parkinsonism, but not Parkinson's disease at this point.  She recommended an MRI of the brain.  This was completed on October 26, 2017.  I have had the opportunity to review this.  There is generalized atrophy, and perhaps more so on the left hemisphere than right.  Pt reports tremor since 02/2016.  She initially thought that it was from the stress of the move (moved from [facility]     Tremor: Yes.     How long has it been going on? 02/2016, pt wonders if sx's due to fall years ago in which she hit face  At rest or with activation?  With rest  Fam hx of tremor?  No.  Located where?  R arm  Affected by caffeine:  Yes.    Affected by alcohol:  Doesn't drink  Affected by stress:  No. but is worse in the cold  Specific Symptoms:  Voice: always soft spoken Sleep: trouble getting and staying asleep, chronic  Vivid Dreams:  No.  Acting out dreams:  No. - doesn't have bed partner Wet Pillows: occasionally Postural symptoms:  No., not usually  Falls?  Yes.  , only one in 2016 Bradykinesia symptoms: difficulty getting out of a chair (she blames on lupus); no shuffling Loss of smell:  No. Loss of taste:  No. Urinary Incontinence:  Yes.  , mild and just uses undergarment for leak.  Urinary urgency Difficulty Swallowing:  No. Handwriting, micrographia: No. Trouble with ADL's:  No.  Trouble buttoning clothing: No. Depression:  No. Memory changes:  Yes.  , minor, short term Hallucinations:  No. but at the end of the visit she goes into detail about what she describes as auditory hallucinations.  These mostly consists  of banging noises.  She described an incident about 3 weeks ago in which she was driving her car and heard a loud banging noise/squealing and she felt that her car could have become "demonically possessed."  She turned off the car and the noise away and then turned back on her car and it came back on.  She did this for several times.  She ultimately had her car take it and she was told that there was a problem with belt, but she is not sure that is actually notes that she heard.  She has had several incidents in her home where she hears banging noises.  Never has heard voices.  visual distortions: No. N/V:  No. Lightheaded:  No.  Syncope: No. 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  She refused medication last visit and wanted to rely on supplements instead.  However, she called only 2 days after our last visit and did ask for a prescription for carbidopa/levodopa.  Only one day later she called back and stated that she stopped the medication because she took 1/2 tablet once and decided the medication "was not for her."  She admitted that she had no side effects and wanted to try "natural  remedies."  She met with our social worker since last visit.  Those notes are reviewed.  She has been stable since our last visit.  Denies falls.  Only noting tremor when cold or anxious.  She will note some tremor during church.  She states that she was good with exercise after last visit but "I tapered off and then haven't gone for a month and I'm not happy with that."  Asks me about supplements like niacin - "I did reading on it and it had good points."  Worried about her sister "taking over things."    01/09/19 update:  Pt seen in f/u for PD.  Pt refused medication for PD.  Have encouraged the patient to exercise and reports that she is exercising over the last 3 weeks.  No falls but occasional  loss of balance.  Skin feels dry.  Sleeping well - "too well."  Awakens to use the BR but able to get back to sleep.  One nightmare but no falling out of the bed.  No hallucinations.  The records that were made available to me were reviewed, including oncology records from her hx of breast CA.  Notes some minor memory change (had trouble remembering a repetitive prayer)  ALLERGIES:   Allergies  Allergen Reactions  . Tetracyclines & Related Other (See Comments)    Stomach burning    CURRENT MEDICATIONS:  Outpatient Encounter Medications as of 01/09/2019  Medication Sig  . anastrozole (ARIMIDEX) 1 MG tablet Take 1 tablet (1 mg total) by mouth daily.  Marland Kitchen Bioflavonoid Products (ESTER-C) TABS Take 1 tablet by mouth daily. Taking AM and PM  . Biotin 10 MG TABS Take 1 tablet by mouth daily.  Marland Kitchen CALCIUM & MAGNESIUM CARBONATES PO Take 3 tablets by mouth daily.  . Cholecalciferol (VITAMIN D3) 2000 units capsule Take by mouth daily.   . Coenzyme Q10 (CO Q-10) 100 MG CAPS Take by mouth. Two caps daily  . MAGNESIUM PO Take by mouth daily.  . [DISCONTINUED] UBIQUINOL PO Take by mouth.  . [DISCONTINUED] UNABLE TO FIND Lion's mane   No facility-administered encounter medications on file as of 01/09/2019.     PAST MEDICAL HISTORY:   Past Medical History:  Diagnosis Date  . Allergy   . Arthritis   . Breast cancer ([place])   . Heart murmur   . Lupus ([place])   . Personal history of chemotherapy   . Personal history of radiation therapy   . Tremor of right hand 08/02/2017    PAST SURGICAL HISTORY:   Past Surgical History:  Procedure Laterality Date  . BREAST BIOPSY    . BREAST LUMPECTOMY Right 2015  . Broken Nose     Patient fell.  Marland Kitchen FOOT SURGERY Right 2010  . REDUCTION MAMMAPLASTY Bilateral 2015    SOCIAL HISTORY:   Social History   Socioeconomic History  . Marital status: Widowed    Spouse name: Not on file  . Number of children: Not on file  . Years of education: Not on file  . Highest  education level: Not on file  Occupational History  . 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 Financial resource strain: Not on file  . Food insecurity:    Worry: Not on file    Inability: Not on file  . Transportation needs:    Medical: Not on file    Non-medical: Not on file  Tobacco Use  .  Smoking status: Never Smoker  . Smokeless tobacco: Never Used  Substance and Sexual Activity  . Alcohol use: Yes    Comment: ONCE A MONTH  . Drug use: No  . Sexual activity: Not Currently    Comment: 1st intercourse- 24, partners- 4, widow  Lifestyle  . Physical activity:    Days per week: Not on file    Minutes per session: Not on file  . Stress: Not on file  Relationships  . Social connections:    Talks on phone: Not on file    Gets together: Not on file    Attends religious service: Not on file    Active member of club or organization: Not on file    Attends meetings of clubs or organizations: Not on file    Relationship status: Not on file  . Intimate partner violence:    Fear of current or ex partner: Not on file    Emotionally abused: Not on file    Physically abused: Not on file    Forced sexual activity: Not on file  Other Topics Concern  . Not on file  Social History Narrative  . Not on file    FAMILY HISTORY:   Family Status  Relation Name Status  . Mother  Deceased  . Sister  Alive  . Father  Deceased  . Sister  Alive  . Brother  Alive  . Neg Hx  (Not Specified)    ROS:  Review of Systems  Constitutional: Negative.   HENT: Negative.   Eyes: Negative.   Cardiovascular: Negative.   Gastrointestinal: Negative.   Genitourinary: Negative.   Skin: Negative.     PHYSICAL EXAMINATION:    VITALS:   Vitals:   01/09/19 1511  BP: 136/84  Pulse: 74  SpO2: 95%  Weight: 201 lb (91.2 kg)  Height: _0  (1.6 m)    GEN:  The patient appears stated age and is in NAD. HEENT:  Normocephalic, atraumatic.   The mucous membranes are moist. The superficial temporal arteries are without ropiness or tenderness. CV:  RRR Lungs:  CTAB Neck/HEME:  There are no carotid bruits bilaterally.  Neurological examination:  Orientation: The patient is alert and oriented x3. Cranial nerves: There is good facial symmetry. The speech is fluent and clear. Soft palate rises symmetrically and there is no tongue deviation. Hearing is intact to conversational tone. Sensation: Sensation is intact to light touch throughout Motor: Strength is 5/5 in the bilateral upper and lower extremities.   Shoulder shrug is equal and symmetric.  There is no pronator drift.   Movement examination: Tone: There is mild increased tone in the RUE Abnormal movements: There is RUE resting tremor Coordination:  There is minimal decremation with RAM's, seen with hand opening and closing and toe taps on the right Gait and Station: The patient has no difficulty arising out of a deep-seated chair without the use of the hands. The patient's stride length is normal with mild decreased arm swing on the right.     ASSESSMENT/PLAN:  1.  Idiopathic Parkinson's disease.  The patient has tremor, bradykinesia, rigidity and mild postural instability.  -We discussed the diagnosis as well as pathophysiology of the disease.  We discussed treatment options as well as prognostic indicators.  Patient education was provided.  -We discussed that it used to be thought that levodopa would increase risk of melanoma but now it is believed that Parkinsons itself likely increases risk of melanoma. she is to get regular skin  checks.  Reminded her about that today.  She used to have a dermatologist but "I didn't like her."  Encouraged her to find another  -she has started exercising and encouraged her to continue and increase that.    -she does not want any medication.  As above, tried 1/2 tablet one time of carbidopa/levodopa and decided medication wasn't for her.  No  SE per patient today.  -asks me again about various supplements that she is paying an "arm and a leg for."  Told her that I did not recommend them for PD. 2.  Insomnia, improved   -tried melatonin in the past without relief 3.  Memory Loss  -talked to the patient about learning new things and increasing exercise  -talked about neurocognitive testing but she will think about that and will discuss next visit again  -I do think that there could be underlying psych illness 4.  Much greater than 50% of this visit was spent in counseling and coordinating care.  Total face to face time:  25 min   Cc:  Sarah Reeve, DO

## 2019-01-09 ENCOUNTER — Encounter: Payer: Self-pay | Admitting: Neurology

## 2019-01-09 ENCOUNTER — Ambulatory Visit (INDEPENDENT_AMBULATORY_CARE_PROVIDER_SITE_OTHER): Payer: Medicare Other | Admitting: Neurology

## 2019-01-09 VITALS — BP 136/84 | HR 74 | Ht 63.0 in | Wt 201.0 lb

## 2019-01-09 DIAGNOSIS — G20 Parkinson's disease: Secondary | ICD-10-CM

## 2019-01-09 DIAGNOSIS — R413 Other amnesia: Secondary | ICD-10-CM

## 2019-01-09 NOTE — Patient Instructions (Signed)
Keep exercising!  Do mental exercises to keep your brain challenged!  The physicians and staff at [facility] are committed to providing excellent care. You may receive a survey requesting feedback about your experience at our office. We strive to receive "very good" responses to the survey questions. If you feel that your experience would prevent you from giving the office a "very good " response, please contact our office to try to remedy the situation. We may be reached at [phone]. Thank you for taking the time out of your busy day to complete the survey.

## 2019-01-20 DIAGNOSIS — R011 Cardiac murmur, unspecified: Secondary | ICD-10-CM | POA: Diagnosis not present

## 2019-01-20 DIAGNOSIS — R159 Full incontinence of feces: Secondary | ICD-10-CM | POA: Diagnosis not present

## 2019-01-20 DIAGNOSIS — I700 Atherosclerosis of aorta: Secondary | ICD-10-CM | POA: Diagnosis not present

## 2019-01-20 DIAGNOSIS — M329 Systemic lupus erythematosus, unspecified: Secondary | ICD-10-CM | POA: Diagnosis not present

## 2019-01-20 DIAGNOSIS — H905 Unspecified sensorineural hearing loss: Secondary | ICD-10-CM | POA: Diagnosis not present

## 2019-01-20 DIAGNOSIS — L719 Rosacea, unspecified: Secondary | ICD-10-CM | POA: Diagnosis not present

## 2019-01-20 DIAGNOSIS — G4700 Insomnia, unspecified: Secondary | ICD-10-CM | POA: Diagnosis not present

## 2019-01-20 DIAGNOSIS — N183 Chronic kidney disease, stage 3 (moderate): Secondary | ICD-10-CM | POA: Diagnosis not present

## 2019-01-20 DIAGNOSIS — G20 Parkinson's disease: Secondary | ICD-10-CM | POA: Diagnosis not present

## 2019-01-20 DIAGNOSIS — C50919 Malignant neoplasm of unspecified site of unspecified female breast: Secondary | ICD-10-CM | POA: Diagnosis not present

## 2019-01-30 DIAGNOSIS — Z23 Encounter for immunization: Secondary | ICD-10-CM | POA: Diagnosis not present

## 2019-02-04 ENCOUNTER — Other Ambulatory Visit: Payer: Medicare Other

## 2019-02-23 DIAGNOSIS — L97511 Non-pressure chronic ulcer of other part of right foot limited to breakdown of skin: Secondary | ICD-10-CM | POA: Diagnosis not present

## 2019-03-23 DIAGNOSIS — Z7189 Other specified counseling: Secondary | ICD-10-CM | POA: Diagnosis not present

## 2019-03-23 DIAGNOSIS — N189 Chronic kidney disease, unspecified: Secondary | ICD-10-CM | POA: Diagnosis not present

## 2019-03-24 ENCOUNTER — Telehealth: Payer: Self-pay | Admitting: Hematology

## 2019-03-24 NOTE — Telephone Encounter (Signed)
done

## 2019-03-24 NOTE — Telephone Encounter (Signed)
Spoke with patient and she would like to reschedule appointment on 03/27/19 to a later date.  If you can not reach her, please leave a message.

## 2019-03-24 NOTE — Telephone Encounter (Signed)
Called patient per 5/5 sch message - unable to reach patient . Left message for patient to call back

## 2019-03-24 NOTE — Telephone Encounter (Signed)
Sarah Mata, please send a schedule message, thanks   Truitt Merle MD

## 2019-03-26 ENCOUNTER — Telehealth: Payer: Self-pay | Admitting: Hematology

## 2019-03-26 NOTE — Telephone Encounter (Signed)
Called patient per 5/07 sch message - unable to reach patient . Left message for patient to come back

## 2019-03-27 ENCOUNTER — Inpatient Hospital Stay: Payer: Medicare Other | Admitting: Hematology

## 2019-03-27 ENCOUNTER — Telehealth: Payer: Self-pay | Admitting: Hematology

## 2019-03-27 ENCOUNTER — Other Ambulatory Visit: Payer: Medicare Other

## 2019-03-27 NOTE — Telephone Encounter (Signed)
R/s appt per 5/07 sch message - unable to reach patient. Reschedule and left message with appt date and time

## 2019-05-19 ENCOUNTER — Encounter: Payer: Self-pay | Admitting: Podiatry

## 2019-05-19 ENCOUNTER — Ambulatory Visit (INDEPENDENT_AMBULATORY_CARE_PROVIDER_SITE_OTHER): Payer: Medicare Other

## 2019-05-19 ENCOUNTER — Ambulatory Visit (INDEPENDENT_AMBULATORY_CARE_PROVIDER_SITE_OTHER): Payer: Medicare Other | Admitting: Podiatry

## 2019-05-19 ENCOUNTER — Other Ambulatory Visit: Payer: Self-pay

## 2019-05-19 VITALS — BP 162/84 | HR 73 | Resp 16

## 2019-05-19 DIAGNOSIS — G5761 Lesion of plantar nerve, right lower limb: Secondary | ICD-10-CM | POA: Diagnosis not present

## 2019-05-19 DIAGNOSIS — M779 Enthesopathy, unspecified: Secondary | ICD-10-CM | POA: Diagnosis not present

## 2019-05-19 DIAGNOSIS — M79676 Pain in unspecified toe(s): Secondary | ICD-10-CM

## 2019-05-19 DIAGNOSIS — Q828 Other specified congenital malformations of skin: Secondary | ICD-10-CM | POA: Diagnosis not present

## 2019-05-19 DIAGNOSIS — B351 Tinea unguium: Secondary | ICD-10-CM

## 2019-05-19 DIAGNOSIS — M778 Other enthesopathies, not elsewhere classified: Secondary | ICD-10-CM

## 2019-05-19 DIAGNOSIS — G5781 Other specified mononeuropathies of right lower limb: Secondary | ICD-10-CM

## 2019-05-19 DIAGNOSIS — R490 Dysphonia: Secondary | ICD-10-CM | POA: Insufficient documentation

## 2019-05-19 NOTE — Progress Notes (Signed)
Subjective:  Patient ID: Sarah Mata, female    DOB: 04-Nov-1941,  MRN: [id number] HPI Chief Complaint  Patient presents with  . Foot Pain    Plantar forefoot right - tender, callused area x 2 years, worsened about 1 month ago, more tender walking, previous surgery, was seeing doc and he trimmed it  . New Patient (Initial Visit)    78 y.o. female presents with the above complaint.   ROS: Denies fever chills nausea vomiting muscle aches pains calf pain back pain chest pain shortness of breath.  Past Medical History:  Diagnosis Date  . Allergy   . Arthritis   . Breast cancer ([place])   . Heart murmur   . Lupus ([place])   . Personal history of chemotherapy   . Personal history of radiation therapy   . 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 BREAST BIOPSY    . BREAST LUMPECTOMY Right 2015  . Broken Nose     Patient fell.  Marland Kitchen FOOT SURGERY Right 2010  . REDUCTION MAMMAPLASTY Bilateral 2015    Current Outpatient Medications:  .  aspirin EC 81 MG tablet, Take 81 mg by mouth daily., Disp: , Rfl:  .  TURMERIC PO, Take by mouth., Disp: , Rfl:  .  anastrozole (ARIMIDEX) 1 MG tablet, Take 1 tablet (1 mg total) by mouth daily., Disp: 90 tablet, Rfl: 3 .  Bioflavonoid Products (ESTER-C) TABS, Take 1 tablet by mouth daily. Taking AM and PM, Disp: , Rfl:  .  Biotin 10 MG TABS, Take 1 tablet by mouth daily., Disp: , Rfl:  .  CALCIUM & MAGNESIUM CARBONATES PO, Take 3 tablets by mouth daily., Disp: , Rfl:  .  Cholecalciferol (VITAMIN D3) 2000 units capsule, Take by mouth daily. , Disp: , Rfl:  .  Coenzyme Q10 (CO Q-10) 100 MG CAPS, Take by mouth. Two caps daily, Disp: , Rfl:  .  ketorolac (ACULAR) 0.5 % ophthalmic solution, , Disp: , Rfl:  .  prednisoLONE acetate (PRED FORTE) 1 % ophthalmic suspension, INSTILL 1 DROP INTO LEFT EYE 4 TIMES A DAY, Disp: , Rfl:   Allergies  Allergen Reactions  . Tetracyclines & Related Other (See Comments)   Stomach burning   Review of Systems Objective:   Vitals:   05/19/19 1511  BP: (!) 162/84  Pulse: 73  Resp: 16    General: Well developed, nourished, in no acute distress, alert and oriented x3   Dermatological: Skin is warm, dry and supple bilateral. Nails x 10 are well maintained; remaining integument appears unremarkable at this time. There are no open sores, no preulcerative lesions, no rash or signs of infection present.  Toenails are thick yellow dystrophic-like mycotic painful palpation.  Reactive hyperkeratotic lesion sub-third met right.  No blood beneath it no signs of infection.  Vascular: Dorsalis Pedis artery and Posterior Tibial artery pedal pulses are 2/4 bilateral with immedate capillary fill time. Pedal hair growth present. No varicosities and no lower extremity edema present bilateral.   Neruologic: Grossly intact via light touch bilateral. Vibratory intact via tuning fork bilateral. Protective threshold with Semmes Wienstein monofilament intact to all pedal sites bilateral. Patellar and Achilles deep tendon reflexes 2+ bilateral. No Babinski or clonus noted bilateral.  She has a palpable neuroma third interdigital space of the right foot.  Musculoskeletal: No gross boney pedal deformities bilateral. No pain, crepitus, or limitation noted with foot and ankle range of motion bilateral. Muscular strength 5/5  in all groups tested bilateral.  Plantarflexed third metatarsal of the right foot resulting in reactive hyperkeratotic lesion.  Gait: Unassisted, Nonantalgic.    Radiographs:  Radiographs taken today demonstrate right foot and ankle demonstrating plate and 2 malleolar screws fibula and medial malleolus.  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  Painful elongated toenails.  Neuroma third interspace right  foot.  Plan: Debrided reactive hyperkeratotic lesion.  Debrided toenails 1 through 5 bilateral.  Asked Rick to make urine appliance to help offload that third knuckle.  Injected 10 mg of Kenalog to the third interspace of the right foot with local anesthetic.      T. Cesar Chavez, [place]

## 2019-06-11 ENCOUNTER — Telehealth: Payer: Self-pay | Admitting: Neurology

## 2019-06-11 NOTE — Telephone Encounter (Signed)
Noted, agree with recommendations to go to ER or call 911.

## 2019-06-11 NOTE — Telephone Encounter (Signed)
New Message  Patient verbalized about her possibly having a stroke two days/nights ago and she was wanting to be seen my Dr. Carles Collet.  Advised patient Dr. Carles Collet is out of the office and she will need to go to ED or Urgent Care for MRI or CT.  Patient verbalized she will be going to Urgent care and provided the [facility] due to less waiting times.  Patient verbalized "Okay"

## 2019-06-11 NOTE — Telephone Encounter (Signed)
Called patient 3 times  to advise that E.D. would be the best place to go for stroke like symptoms Urgent care is for acute/minor illness No answer calls goes to voice mail; voice mail box is not set up unable to leave message.  Will try calling back

## 2019-06-16 ENCOUNTER — Other Ambulatory Visit: Payer: Self-pay

## 2019-06-16 ENCOUNTER — Ambulatory Visit (INDEPENDENT_AMBULATORY_CARE_PROVIDER_SITE_OTHER): Payer: Medicare Other | Admitting: Podiatry

## 2019-06-16 ENCOUNTER — Encounter: Payer: Self-pay | Admitting: Podiatry

## 2019-06-16 VITALS — Temp 98.2°F

## 2019-06-16 DIAGNOSIS — G5761 Lesion of plantar nerve, right lower limb: Secondary | ICD-10-CM

## 2019-06-16 DIAGNOSIS — G5781 Other specified mononeuropathies of right lower limb: Secondary | ICD-10-CM

## 2019-06-17 ENCOUNTER — Encounter: Payer: Self-pay | Admitting: Podiatry

## 2019-06-17 NOTE — Progress Notes (Signed)
She presents today for follow-up of neuroma third interdigital space of the right foot.  States that is really sore for about a week after the injection but the top of the left foot is hurting now.  Objective: Vital signs are stable she is alert and oriented x3 she has the same pain that she did previously within the third interdigital space of the right foot.  Palpable Mulder's click is present.  Assessment: Neuroma third interspace right.  Plan: Started her first dose of dehydrated alcohol today after sterile Betadine skin prep a total of 2 cc 4% dehydrated alcohol was injected.  She tolerated procedure well without complication.  Follow-up with her in 3 weeks

## 2019-06-23 ENCOUNTER — Encounter: Payer: Self-pay | Admitting: Neurology

## 2019-07-10 ENCOUNTER — Ambulatory Visit: Payer: Medicare Other | Admitting: Neurology

## 2019-07-20 NOTE — Progress Notes (Signed)
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 Grade 3, Ki-67 20%.   03/2014 Miscellaneous   mammaprint reviewed high-risk luminal type   04/2014 -  Radiation Therapy   Patient underwent radiation to the right chest wall   08/2014 -  Anti-estrogen oral therapy   Letrozole 2.5 mg started in 08/2014 and due to poor tolerance switched to Anastrozole 1.0 mg, put on hold due to elevated liver enzyme since 05/21/2017 and restarted on 06/26/17   08/06/2017 Pathology Results   Diagnosis, colonoscopy 1. Surgical [P], right colon biopsies - COLONIC MUCOSA WITH BENIGN LYMPHOID AGGREGATES. - NO MICROSCOPIC COLITIS, ACTIVE INFLAMMATION, CHRONIC CHANGES OR GRANULOMAS. 2. Surgical [P], left colon biopsies - COLONIC MUCOSA WITH BENIGN LYMPHOID AGGREGATES. - NO MICROSCOPIC COLITIS, ACTIVE INFLAMMATION, CHRONIC CHANGES OR GRANULOMAS.   08/06/2017 Procedure   Colonoscopy by Dr. Silverio Decamp on 08/06/17 IMPRESSION -  Diverticulosis in the sigmoid colon and in the descending colon. - Non-bleeding internal hemorrhoids. - The entire examined colon is normal. Biopsied.    12/20/2017 Mammogram   There are stable lumpectomy changes in the upper-outer right breast. No mass, nonsurgical distortion, or suspicious microcalcification is identified on the right to suggest malignancy.  There is subtle palpable thickening in the 12 o'clock to 1 o'clock left breast just superior to the nipple.   IMPRESSION: Architectural distortion in the 12 o'clock region of the left breast. Malignancy, postsurgical scarring, or complex sclerosing lesion are considerations.   12/20/2017 Breast US   Targeted ultrasound is performed, showing a hypoechoic irregular mass with shadowing at 12 o'clock position 1-2 cm from the nipple measuring approximately 1.3 x 1.6 x 0.7 cm. Ultrasound of the region of patient concern discovered today while she was here in the office at 7:30 position shows normal fatty tissue.   12/23/2017 Procedure   Clip placement FINDINGS: Mammographic images were obtained following stereotactic guided biopsy of left breast distortion. The coil shaped biopsy marking clip is well positioned at the site of distortion in the upper slightly inner quadrant of the left breast.    12/23/2017 Pathology Results   Diagnosis Breast, left, needle core biopsy, upper inner quadrant - DENSE FIBROSIS AND PIGMENTED HISTIOCYTES CONSISTENT WITH PREVIOUS SURGICAL SITE - FOCAL FIBROCYSTIC CHANGES - NO MALIGNANCY IDENTIFIED      CURRENT THERAPY:  Letrozole started in 08/2014 and due to poor tolerance switched to Anastrozole, put on hold due to elevated liver enzyme since 05/21/2017 and restarted on 06/26/17   INTERVAL HISTORY:  [name] is here for a follow up of right  breast cancer. She was last seen by me 10 months ago. She presents to the clinic alone. She notes she has been feeling very tired and sleepy lately. She has not seen her  PCP in several months. She use to be seen by her old PCP Dr Sheppard Coil but it is too far now.  She notes she has been gaining weight. She denies prior Thyroid function issues.  She also notes heaviness of her right breast intermittently. Has overall occasional chest discomfort. She notes she tries to lose weight. She has not been exercising really due to her fatigue and the heat. She is now taking Stevia for her sugar intake with coffee and tea. She notes has been urinating more frequently and has occasionally have incontinence.  She has been tolerating Anastrozole well. She notes she has financially tight lately due to [place].    REVIEW OF SYSTEMS:   Constitutional: Denies fevers, chills or abnormal weight loss (+) Significant fatigue and tiredness Eyes: Denies blurriness of vision Ears, nose, mouth, throat, and face: Denies mucositis or sore throat Respiratory: Denies cough, dyspnea or wheezes (+) occasional chest discomfort  Cardiovascular: Denies palpitation, chest discomfort or lower extremity swelling Gastrointestinal:  Denies nausea, heartburn or change in bowel habits  UA: (+) Polyuria and mild urine incontinence.  Skin: Denies abnormal skin rashes Lymphatics: Denies new lymphadenopathy or easy bruising Neurological:Denies numbness, tingling or new weaknesses Behavioral/Psych: Mood is stable, no new changes  Breast: (+) right breast heaviness intermittently All other systems were reviewed with the patient and are negative.  MEDICAL HISTORY:  Past Medical History:  Diagnosis Date  . Allergy   . Arthritis   . Breast cancer ([place])   . Heart murmur   . Lupus ([place])   . Personal history of chemotherapy   . Personal history of radiation therapy   . Tremor of right hand 08/02/2017    SURGICAL HISTORY: Past Surgical History:  Procedure Laterality Date  . BREAST BIOPSY    . BREAST LUMPECTOMY Right 2015  . Broken Nose     Patient fell.  Marland Kitchen FOOT SURGERY Right 2010  . REDUCTION  MAMMAPLASTY Bilateral 2015    I have reviewed the social history and family history with the patient and they are unchanged from previous note.  ALLERGIES:  is allergic to tetracyclines & related.  MEDICATIONS:  Current Outpatient Medications  Medication Sig Dispense Refill  . anastrozole (ARIMIDEX) 1 MG tablet Take 1 tablet (1 mg total) by mouth daily. 90 tablet 3  . aspirin EC 81 MG tablet Take 81 mg by mouth daily.    Marland Kitchen Bioflavonoid Products (ESTER-C) TABS Take 1 tablet by mouth daily. Taking AM and PM    . Cholecalciferol (VITAMIN D3) 2000 units capsule Take 5,000 Units by mouth daily.     . Coenzyme Q10 (CO Q-10) 100 MG CAPS Take by mouth. Two caps daily    . ketorolac (ACULAR) 0.5 % ophthalmic solution     . prednisoLONE acetate (PRED FORTE) 1 % ophthalmic suspension INSTILL 1 DROP INTO LEFT EYE 4 TIMES A DAY    . CALCIUM & MAGNESIUM CARBONATES PO Take 3 tablets by mouth daily.    . TURMERIC PO Take by mouth.     No current facility-administered medications for this visit.     PHYSICAL EXAMINATION: ECOG PERFORMANCE STATUS: 2 - Symptomatic, <50% confined to bed  Vitals:   07/24/19 1541  BP: (!) 158/76  Pulse: 80  Resp: 18  Temp: 98 F (36.7 C)  SpO2: 96%   Filed Weights   07/24/19 1541  Weight: 203 lb 3.2 oz (92.2 kg)    GENERAL:alert, no distress and comfortable SKIN: skin color, texture, turgor are normal, no rashes or significant lesions EYES: normal, Conjunctiva are pink and non-injected, sclera clear  NECK: supple, thyroid normal size, non-tender, without nodularity LYMPH:  no palpable lymphadenopathy in the cervical, axillary  LUNGS: clear to auscultation and percussion with normal breathing effort HEART: regular rate & rhythm and no murmurs and no lower extremity edema ABDOMEN:abdomen soft, non-tender and normal bowel sounds Musculoskeletal:no cyanosis of digits and no clubbing  NEURO: alert & oriented x 3 with fluent speech, no focal motor/sensory  deficits BREAST: S/p right lumpectomy and b/l reduction: Surgical incisions healed well with mild scar tissue of left breast. No palpable mass, nodules or adenopathy bilaterally. Breast exam benign.   LABORATORY DATA:  I have reviewed the data as listed CBC Latest Ref Rng & Units 07/24/2019 09/26/2018 03/26/2018  WBC 4.0 - 10.5 K/uL 7.1 5.7 5.9  Hemoglobin 12.0 - 15.0 g/dL 14.1 14.4 13.5  Hematocrit 36.0 - 46.0 % 42.4 44.2 40.4  Platelets 150 - 400 K/uL 166 162 150     CMP Latest Ref Rng & Units 07/24/2019 09/26/2018 03/26/2018  Glucose 70 - 99 mg/dL 134(H) 89 92  BUN 8 - 23 mg/dL 21 24(H) 13  Creatinine 0.44 - 1.00 mg/dL 1.00 1.04(H) 0.89  Sodium 135 - 145 mmol/L 134(L) 140 135(L)  Potassium 3.5 - 5.1 mmol/L 4.4 4.6 4.2  Chloride 98 - 111 mmol/L 102 104 104  CO2 22 - 32 mmol/L 21(L) 29 25  Calcium 8.9 - 10.3 mg/dL 9.1 9.7 9.2  Total Protein 6.5 - 8.1 g/dL 7.3 7.4 6.9  Total Bilirubin 0.3 - 1.2 mg/dL 0.4 0.4 0.4  Alkaline Phos 38 - 126 U/L 126 128(H) 124  AST 15 - 41 U/L '23 21 29  '$ ALT 0 - 44 U/L '23 19 31      '$ RADIOGRAPHIC STUDIES: I have personally reviewed the radiological images as listed and agreed with the findings in the report. No results found.   ASSESSMENT & PLAN:  Sarah Mata is a 78 y.o. female with    1. Right breast cancer stage IIA, ER+/PR+/HER2-, mammaprint high risk -She was diagnosed in 02/2014. She is s/p right lumpectomy and adjuvant radiation. She started adjuvant letrozole in 08/2014, tolerating well overall.  -We previously discussed the risk of cancer recurrence. She has high risk for recurrence based on the mammaprint result. -Due to shaking of right hand, she is very concerned this could be side effect of the letrozole. I switched her to Anastrozole '1mg'$  to take once daily in 05/2017. Plan to complete 7 years in mid-late 2022 if she can tolerate well.  -She is clinically doing well. Lab reviewed, her CBC and CMP are within normal limits except BG 134. Vit D  and Ca 27.29 still pending. Her physical exam and her 12/2018 mammogram were unremarkable. There is no clinical concern for recurrence. -She feels heaviness of her right arm, no sign of lymphedema. I suggest PT to prevent any lymphedema. Given she is financially tight, she declined  -For her Polyuria and mild urinary incontinence I recommend she empty bladder when half full and f/u with her PCP  -Next mammogram 12/2019 -Continue anastrozole, refilled today  -F/u in 6 months. I recommend she find new PCP in interim such as [facility].   2. Fatigue, Weight Gain  -She has been  more fatigued lately. -I encouraged her to exercise more such as walking 30 minutes at least a few times a week -I also encouraged her to increase protein in diet and reduce her carbohydrate intake.  -Will check her TSH today (07/24/19)  3. Transaminates -this was discovered on routine lab work on 05/21/2017 -Prior CT abdomen was negative  -Her husband had chronic hepatitis, and passed away in February 12, 2011, I recommend her to check for hepB and C -autoimmune hepatitis is also possible due to her lupus.  -she had liver work up by Dr. Silverio Decamp. She had a normal CT and colonoscopy  -resolved  4. Osteoporosis  -I previously discussed the potential osteopenia and osteoporosis from aromatase inhibitor -Her 12/2018 DEXA shows osteoporosis with lowest T-score -2.6 at forearm radius -I encouraged her to continue calcium and vitamin D and to avoid fall. Given her Parkinson's I suggest she use cane or walker if needed.   5. Lupus -She will continue f/u with Dr. Estanislado Pandy   6. Parkinson's Disease  -Followed by Dr. Carles Collet, patient declined carbidopa-levodopa; opted for natural remedies including physical and mental exercise -She is currently on observation, stable.  -She does not ambulate in straight line. She will see Dr. Carles Collet in Fall 2020    PLAN Continue anastrozole  Mammogram in 12/2019 Lab and f/u in 6 months    No  problem-specific Assessment & Plan notes found for this encounter.   No orders of the defined types were placed in this encounter.  All questions were answered. The patient knows to call the clinic with any problems, questions or concerns. No barriers to learning was detected. I spent 15 minutes counseling the patient face to face. The total time spent in the appointment was 20 minutes and more than 50% was on counseling and review of test results     Truitt Merle, MD 07/24/2019   I, Joslyn Devon, am acting as scribe for Truitt Merle, MD.   I have reviewed the above documentation for accuracy and completeness, and I agree with the above.

## 2019-07-21 ENCOUNTER — Ambulatory Visit (INDEPENDENT_AMBULATORY_CARE_PROVIDER_SITE_OTHER): Payer: Medicare Other | Admitting: Podiatry

## 2019-07-21 ENCOUNTER — Encounter: Payer: Self-pay | Admitting: Podiatry

## 2019-07-21 ENCOUNTER — Other Ambulatory Visit: Payer: Self-pay

## 2019-07-21 DIAGNOSIS — Q828 Other specified congenital malformations of skin: Secondary | ICD-10-CM | POA: Diagnosis not present

## 2019-07-21 DIAGNOSIS — M79676 Pain in unspecified toe(s): Secondary | ICD-10-CM | POA: Diagnosis not present

## 2019-07-21 DIAGNOSIS — B351 Tinea unguium: Secondary | ICD-10-CM | POA: Diagnosis not present

## 2019-07-22 ENCOUNTER — Encounter: Payer: Self-pay | Admitting: Podiatry

## 2019-07-22 NOTE — Progress Notes (Signed)
She presents today states that her neuroma is doing fine but anemic toenails and calluses trimmed.  Objective: Vital signs are stable she is alert and oriented x3 multiple reactive hyperkeratotic lesions plantar aspect of the bilateral foot none ulcerated no lesions noted.  She has thick yellow dystrophic clinically mycotic nails.  Assessment: Pain in limb secondary to onychomycosis and porokeratosis.  Plan: Debridement of toenails 1 through 5 bilaterally.  Debridement of reactive hyperkeratotic tissue.

## 2019-07-24 ENCOUNTER — Other Ambulatory Visit: Payer: Medicare Other

## 2019-07-24 ENCOUNTER — Inpatient Hospital Stay: Payer: Medicare Other | Attending: Hematology

## 2019-07-24 ENCOUNTER — Other Ambulatory Visit: Payer: Self-pay

## 2019-07-24 ENCOUNTER — Encounter: Payer: Self-pay | Admitting: Hematology

## 2019-07-24 ENCOUNTER — Inpatient Hospital Stay (HOSPITAL_BASED_OUTPATIENT_CLINIC_OR_DEPARTMENT_OTHER): Payer: Medicare Other | Admitting: Hematology

## 2019-07-24 ENCOUNTER — Ambulatory Visit: Payer: Medicare Other | Admitting: Hematology

## 2019-07-24 VITALS — BP 158/76 | HR 80 | Temp 98.0°F | Resp 18 | Ht 63.0 in | Wt 203.2 lb

## 2019-07-24 DIAGNOSIS — C50911 Malignant neoplasm of unspecified site of right female breast: Secondary | ICD-10-CM

## 2019-07-24 DIAGNOSIS — G20 Parkinson's disease: Secondary | ICD-10-CM | POA: Insufficient documentation

## 2019-07-24 DIAGNOSIS — M329 Systemic lupus erythematosus, unspecified: Secondary | ICD-10-CM | POA: Insufficient documentation

## 2019-07-24 DIAGNOSIS — C50411 Malignant neoplasm of upper-outer quadrant of right female breast: Secondary | ICD-10-CM | POA: Diagnosis not present

## 2019-07-24 DIAGNOSIS — R5383 Other fatigue: Secondary | ICD-10-CM

## 2019-07-24 DIAGNOSIS — M810 Age-related osteoporosis without current pathological fracture: Secondary | ICD-10-CM | POA: Insufficient documentation

## 2019-07-24 DIAGNOSIS — Z79811 Long term (current) use of aromatase inhibitors: Secondary | ICD-10-CM | POA: Insufficient documentation

## 2019-07-24 DIAGNOSIS — Z170 Estrogen receptor positive status [ER+]: Secondary | ICD-10-CM | POA: Insufficient documentation

## 2019-07-24 DIAGNOSIS — E559 Vitamin D deficiency, unspecified: Secondary | ICD-10-CM

## 2019-07-24 LAB — COMPREHENSIVE METABOLIC PANEL
ALT: 23 U/L (ref 0–44)
AST: 23 U/L (ref 15–41)
Albumin: 3.7 g/dL (ref 3.5–5.0)
Alkaline Phosphatase: 126 U/L (ref 38–126)
Anion gap: 11 (ref 5–15)
BUN: 21 mg/dL (ref 8–23)
CO2: 21 mmol/L — ABNORMAL LOW (ref 22–32)
Calcium: 9.1 mg/dL (ref 8.9–10.3)
Chloride: 102 mmol/L (ref 98–111)
Creatinine, Ser: 1 mg/dL (ref 0.44–1.00)
GFR calc Af Amer: >60 mL/min (ref >60)
GFR calc non Af Amer: 55 mL/min — ABNORMAL LOW (ref >60)
Glucose, Bld: 134 mg/dL — ABNORMAL HIGH (ref 70–99)
Potassium: 4.4 mmol/L (ref 3.5–5.1)
Sodium: 134 mmol/L — ABNORMAL LOW (ref 135–145)
Total Bilirubin: 0.4 mg/dL (ref 0.3–1.2)
Total Protein: 7.3 g/dL (ref 6.5–8.1)

## 2019-07-24 LAB — CBC WITH DIFFERENTIAL/PLATELET
Abs Immature Granulocytes: 0.02 10*3/uL (ref 0.00–0.07)
Basophils Absolute: 0.1 10*3/uL (ref 0.0–0.1)
Basophils Relative: 1 %
Eosinophils Absolute: 0.2 10*3/uL (ref 0.0–0.5)
Eosinophils Relative: 2 %
HCT: 42.4 % (ref 36.0–46.0)
Hemoglobin: 14.1 g/dL (ref 12.0–15.0)
Immature Granulocytes: 0 %
Lymphocytes Relative: 21 %
Lymphs Abs: 1.5 10*3/uL (ref 0.7–4.0)
MCH: 29.7 pg (ref 26.0–34.0)
MCHC: 33.3 g/dL (ref 30.0–36.0)
MCV: 89.5 fL (ref 80.0–100.0)
Monocytes Absolute: 0.8 10*3/uL (ref 0.1–1.0)
Monocytes Relative: 11 %
Neutro Abs: 4.6 10*3/uL (ref 1.7–7.7)
Neutrophils Relative %: 65 %
Platelets: 166 10*3/uL (ref 150–400)
RBC: 4.74 MIL/uL (ref 3.87–5.11)
RDW: 13.2 % (ref 11.5–15.5)
WBC: 7.1 10*3/uL (ref 4.0–10.5)
nRBC: 0 % (ref 0.0–0.2)

## 2019-07-25 ENCOUNTER — Encounter: Payer: Self-pay | Admitting: Hematology

## 2019-07-25 LAB — CANCER ANTIGEN 27.29: CA 27.29: 43.9 U/mL — ABNORMAL HIGH (ref 0.0–38.6)

## 2019-07-25 LAB — VITAMIN D 25 HYDROXY (VIT D DEFICIENCY, FRACTURES): Vit D, 25-Hydroxy: 45.4 ng/mL (ref 30.0–100.0)

## 2019-07-28 ENCOUNTER — Telehealth: Payer: Self-pay

## 2019-07-28 NOTE — Telephone Encounter (Signed)
I called her back and left a VM, explained the CA27.29 result could be non-specific, and non-related to her breast cancer, giving the overall stable, slightly decreasing level. I recommend to continue monitor it.  Truitt Merle MD

## 2019-07-28 NOTE — Telephone Encounter (Signed)
-----   Message from Truitt Merle, MD sent at 07/25/2019 10:55 AM EDT ----- Please let pt know her lab results, Vitd normal now, continue VitD supplement. Tumor marker CA27.29 has been always mildly elevated, overall stable, likely non-specific, no concerns.  Truitt Merle  07/25/2019

## 2019-07-28 NOTE — Telephone Encounter (Signed)
Spoke with patient regarding her lab results.  Per Dr. Burr Medico Vitamin D level is normal now, instructed her to continue taking Vitamin D supplement.  Tumor marker CA27.29 has always been mildly elevated, overall stable, likely non-specific, no concerns.  Also I let the patient know we were unable to add a TSH to her labs she had done on 9/4.  Recommended she either get this at PCP or she can come back here to have done.  She will follow up with PCP.  She does express concern over the CA27.29 level and I told her I would like Dr. Burr Medico know her concerns.

## 2019-07-29 ENCOUNTER — Telehealth: Payer: Self-pay | Admitting: Hematology

## 2019-07-29 NOTE — Telephone Encounter (Signed)
Called and left msg. Mailed printout  °

## 2019-08-18 ENCOUNTER — Ambulatory Visit: Payer: Medicare Other | Admitting: Podiatry

## 2019-08-20 DIAGNOSIS — Z23 Encounter for immunization: Secondary | ICD-10-CM | POA: Diagnosis not present

## 2019-08-25 ENCOUNTER — Telehealth: Payer: Self-pay | Admitting: Neurology

## 2019-08-25 NOTE — Telephone Encounter (Signed)
Spoke with patient she was informed of provider response.

## 2019-08-25 NOTE — Telephone Encounter (Signed)
Patient needs to talk to talk to someone bout maybe taking some a supplement for her brain  Please call

## 2019-08-25 NOTE — Telephone Encounter (Signed)
Pt has asked me this before.  I don't recommend any of her supplements for PD.  They are not proven to be helpful.

## 2019-09-04 ENCOUNTER — Telehealth: Payer: Self-pay | Admitting: Podiatry

## 2019-09-04 NOTE — Telephone Encounter (Signed)
Pt called returning [name]'s call.  Pt is wanting to get a message to the doctor and see if she can get approval to be seen more frequently than every 62 days for routine care/callus trims.

## 2019-09-16 ENCOUNTER — Ambulatory Visit: Payer: Medicare Other | Admitting: Neurology

## 2019-09-17 NOTE — Progress Notes (Signed)
Virtual Visit via Phone Note (patient scheduled for video, called and canceled the appointment altogether, and then called back and scheduled for telephone) The purpose of this virtual visit is to provide medical care while limiting exposure to the novel coronavirus.    Consent was obtained for telephone visit:  Yes.   Answered questions that patient had about telephone interaction:  Yes.   I discussed the limitations, risks, security and privacy concerns of performing an evaluation and management service by telephone. I also discussed with the patient that there may be a patient responsible charge related to this service. The patient expressed understanding and agreed to proceed.  Pt location: Home Physician Location: office Name of referring provider:  Emeterio Reeve, DO I connected with Sarah Mata at patients initiation/request on 09/18/2019 at  3:30 PM EDT bytelephone application and verified that I am speaking with the correct person using two identifiers. Pt MRN:  [id number] Pt DOB:  1941/07/08 Video Participants:  Sarah Mata;     History of Present Illness:  Patient is seen today for follow-up of Parkinson's disease.  She is on no medication, which is her choice.  Asked multiple times, and called again since our last visit regarding taking more supplements for her brain.  I have not recommended any.  Medical records have been reviewed.  She followed up with oncology on September 4 regarding her history of breast cancer.  She has been stable from that regard.  Reports that she is not sleeping well.  Tried melatonin and "I even made myself stay on it for 3 days."  "I'm like an alarm clock and awaken at 2-3am."  "at one time, melatonin, was really drugging me during the day and I want to get the most out of life."  Not doing much for exercise - "I walk some and I try to get up between TV."  No falls.  When asked if she would like to try meds "I don't want any because I heard that  they dont help.  Even the pharmacy people say it is not good."   Current Outpatient Medications on File Prior to Visit  Medication Sig Dispense Refill   anastrozole (ARIMIDEX) 1 MG tablet Take 1 tablet (1 mg total) by mouth daily. 90 tablet 3   aspirin EC 81 MG tablet Take 81 mg by mouth daily.     Bioflavonoid Products (ESTER-C) TABS Take 1 tablet by mouth daily. Taking AM and PM     CALCIUM & MAGNESIUM CARBONATES PO Take 3 tablets by mouth daily.     Cholecalciferol (VITAMIN D3) 2000 units capsule Take 5,000 Units by mouth daily.      Coenzyme Q10 (CO Q-10) 100 MG CAPS Take by mouth. Two caps daily     ketorolac (ACULAR) 0.5 % ophthalmic solution      prednisoLONE acetate (PRED FORTE) 1 % ophthalmic suspension INSTILL 1 DROP INTO LEFT EYE 4 TIMES A DAY     TURMERIC PO Take by mouth.     No current facility-administered medications on file prior to visit.      Observations/Objective:   Vitals:   09/18/19 1533  Weight: 203 lb (92.1 kg)  Height: 5\' 3"  (1.6 m)       Assessment and Plan:   1.  Idiopathic Parkinson's disease.  The patient has tremor, bradykinesia, rigidity and mild postural instability.             -We discussed the diagnosis as well as pathophysiology of the  disease.  We discussed treatment options as well as prognostic indicators.  Patient education was provided.             -We discussed that it used to be thought that levodopa would increase risk of melanoma but now it is believed that Parkinsons itself likely increases risk of melanoma. she is to get regular skin checks.  Reminded her about that today.  She used to have a dermatologist but "I didn't like her."  Encouraged her to find another             -she has started exercising and encouraged her to continue and increase that.               -she does not want any medication.  As above, tried 1/2 tablet one time of carbidopa/levodopa and decided medication wasn't for her.    Discussed with patient today  that her beliefs about medication really are not correct.  I understand that she does not want to try medication, but also try to get her to understand that medication does not make disease worse.  -We will connect her with my Parkinson's social worker, so they can talk about online exercise programs. 2.  Insomnia  -She does not want medication.  I am not sure she tried melatonin long enough.  Talk to her about diet and exercise, which I do think would help. 3.  Memory Loss             -talked to the patient about learning new things and increasing exercise             -talked about neurocognitive testing but she will think about that and will discuss next visit again             -I do think that there could be underlying psych illness  Follow Up Instructions:  8 month f/u  -I discussed the assessment and treatment plan with the patient. The patient was provided an opportunity to ask questions and all were answered. The patient agreed with the plan and demonstrated an understanding of the instructions.   The patient was advised to call back or seek an in-person evaluation if the symptoms worsen or if the condition fails to improve as anticipated.    Total Time spent in visit with the patient was:  15 min, of which more than 50% of the time was spent in counseling   [place], DO

## 2019-09-18 ENCOUNTER — Encounter: Payer: Self-pay | Admitting: Neurology

## 2019-09-18 ENCOUNTER — Telehealth (INDEPENDENT_AMBULATORY_CARE_PROVIDER_SITE_OTHER): Payer: Medicare Other | Admitting: Neurology

## 2019-09-18 ENCOUNTER — Other Ambulatory Visit: Payer: Self-pay

## 2019-09-18 ENCOUNTER — Telehealth: Payer: Self-pay | Admitting: Neurology

## 2019-09-18 VITALS — Ht 63.0 in | Wt 203.0 lb

## 2019-09-18 DIAGNOSIS — G20 Parkinson's disease: Secondary | ICD-10-CM | POA: Diagnosis not present

## 2019-09-18 DIAGNOSIS — Z029 Encounter for administrative examinations, unspecified: Secondary | ICD-10-CM

## 2019-09-18 NOTE — Telephone Encounter (Signed)
Pt had video visit with me today.  She stated that she could not make the video visit.  cx same day

## 2019-09-18 NOTE — Telephone Encounter (Signed)
Pt called back at appt time and said she never cancelled her appt and wanted to proceed.  She didn't however want video visit and only wanted phone visit so that was what was done today

## 2019-09-21 ENCOUNTER — Telehealth: Payer: Self-pay | Admitting: Neurology

## 2019-09-21 ENCOUNTER — Telehealth: Payer: Self-pay | Admitting: Clinical

## 2019-09-21 NOTE — Telephone Encounter (Signed)
Pt didn't want to make 8 month f/u.  Said she would call back later to schedule

## 2019-09-21 NOTE — Telephone Encounter (Signed)
Hello Sarah Mata,   Dr. Carles Collet said that you are interested in exercise for Parkinson's, right now we have Boxing and Dance available by zoom, info is below.  I've also attached a PowerPoint about the value of forced intense exercise from our [employer] Parkinson's group, I've added your email to the group list. I hope you'll join [place].    ______________________________________ [employer] Orientation to try it out (Saturday class is currently free via zoom) occurs on Thursdays at 10:30am, please let me know if you're interested and I will schedule [place] to meet with the coach.   ______________________________________  Parkinson's Brain Dance & Movement Class available via Zoom.  The dates are as follows, and you can register here: [email or web address] or call [phone]. Classes are FREE!!  October 20-December 8 Tuesdays, 1:00-2:00pm Thursday Evening option TBA  Take care,  Judson Roch

## 2019-09-21 NOTE — Telephone Encounter (Signed)
Patient would like to speak to Judson Roch and states that she will call back to make her 8 month follow up appt with Tat. Sarah please call

## 2019-09-24 ENCOUNTER — Telehealth: Payer: Self-pay

## 2019-09-24 ENCOUNTER — Ambulatory Visit: Payer: Medicare Other | Admitting: Family Medicine

## 2019-09-24 NOTE — Telephone Encounter (Signed)
Left detailed vm message for patient to c/b to r/s 1:20 in office appt today.  Second message left for patient.

## 2019-09-28 ENCOUNTER — Encounter: Payer: Self-pay | Admitting: Podiatry

## 2019-09-28 ENCOUNTER — Ambulatory Visit (INDEPENDENT_AMBULATORY_CARE_PROVIDER_SITE_OTHER): Payer: Medicare Other | Admitting: Podiatry

## 2019-09-28 ENCOUNTER — Other Ambulatory Visit: Payer: Self-pay

## 2019-09-28 DIAGNOSIS — M79676 Pain in unspecified toe(s): Secondary | ICD-10-CM

## 2019-09-28 DIAGNOSIS — M79671 Pain in right foot: Secondary | ICD-10-CM

## 2019-09-28 DIAGNOSIS — B351 Tinea unguium: Secondary | ICD-10-CM

## 2019-09-28 DIAGNOSIS — Q828 Other specified congenital malformations of skin: Secondary | ICD-10-CM | POA: Diagnosis not present

## 2019-09-28 NOTE — Patient Instructions (Signed)

## 2019-10-01 NOTE — Progress Notes (Signed)
Subjective: Sarah Mata presents to clinic with cc of painful mycotic toenails and callus right foot which are aggravated when weightbearing with and without shoe gear.  This pain limits her daily activities. Pain symptoms resolve with periodic professional debridement.  She states she would like to be seen sooner than 3 months due to the fact that her callus has become painful.   She voices no new pedal problems on today's visit.  Current Outpatient Medications on File Prior to Visit  Medication Sig Dispense Refill  . Magnesium 125 MG CAPS     . anastrozole (ARIMIDEX) 1 MG tablet Take 1 tablet (1 mg total) by mouth daily. 90 tablet 3  . aspirin EC 81 MG tablet Take 81 mg by mouth daily.    Marland Kitchen Bioflavonoid Products (ESTER-C) TABS Take 1 tablet by mouth daily. Taking AM and PM    . CALCIUM & MAGNESIUM CARBONATES PO Take 3 tablets by mouth daily.    . Cholecalciferol (VITAMIN D3) 2000 units capsule Take 5,000 Units by mouth daily.     . Coenzyme Q10 (CO Q-10) 100 MG CAPS Take by mouth. Two caps daily    . ketorolac (ACULAR) 0.5 % ophthalmic solution     . prednisoLONE acetate (PRED FORTE) 1 % ophthalmic suspension INSTILL 1 DROP INTO LEFT EYE 4 TIMES A DAY    . TURMERIC PO Take by mouth.     No current facility-administered medications on file prior to visit.      Allergies  Allergen Reactions  . Tetracyclines & Related Other (See Comments)    Stomach burning    Objective: There were no vitals filed for this visit.  Physical Examination:  Vascular  Examination: Capillary refill time immediate x 10 digits.  Palpable DP/PT pulses b/l.  Digital hair present b/l.  No edema noted b/l.  Skin temperature gradient WNL b/l.  Dermatological Examination: Skin with normal turgor, texture and tone b/l.  No open wounds b/l.  No interdigital macerations noted b/l.  Elongated, thick, discolored brittle toenails with subungual debris and pain on dorsal palpation of nailbeds 1-5  b/l.  Porokeratotic lesion submet head 3 right foot with tenderness to palpation. No edema, no erythema, no drainage, no flocculence.   Musculoskeletal Examination: Muscle strength 5/5 to all muscle groups b/l.  Plantarflexed 3rd metatarsal right foot.  No pain, crepitus or joint discomfort with active/passive ROM.  Neurological Examination: Sensation intact 5/5 b/l with 10 gram monofilament.  Vibratory sensation intact b/l.  Proprioceptive sensation intact b/l.  Assessment: 1. Mycotic nail infection with pain 1-5 b/l 2.   Porokeratosis submet head 3 right foot  3.   Pain in foot  Plan: 1. Toenails 1-5 b/l were debrided in length and girth without iatrogenic laceration.  2. Porokeratotic lesion submet head 3 right foot pared utilizing sterile scalpel blade without incident. 3. Continue soft, supportive shoe gear daily. 4. Report any pedal injuries to medical professional. 5. Follow up 9 weeks per patient request to be seen sooner.  6. Patient/POA to call should there be a question/concern in there interim.

## 2019-10-02 ENCOUNTER — Ambulatory Visit: Payer: Medicare Other | Admitting: Hematology

## 2019-10-02 ENCOUNTER — Other Ambulatory Visit: Payer: Medicare Other

## 2019-10-13 ENCOUNTER — Telehealth: Payer: Self-pay | Admitting: Neurology

## 2019-10-13 NOTE — Telephone Encounter (Signed)
Patient stated that she is buying holistic supplements to treat parkinsons through a company and that was what she wanted to discuss. She said the vitamins they sell are pure and better for the body. I advised her that Dr Tat can not speak on those products as she does not practice that form of medicine. She then asked if Dr Tat can have her insurance company send her a stationary bike and I advised patient to [employer]. Pyramid power is another thing the patient is questioning and I again advised the patient that this is not in the within the scope of medicine that Dr Tat chooses to practice and she can not advise her on it for that reason.

## 2019-10-13 NOTE — Telephone Encounter (Signed)
Patient is calling in about the new medication that it supposed to purify her. She said she was needing more info about this. She didn't have the name of the medication at this time. Do you know anything about this? Thanks!

## 2019-10-13 NOTE — Telephone Encounter (Signed)
I have no information about this.  Let pt know that I really only practice standard of care regarding parkinsons disease

## 2019-10-24 ENCOUNTER — Other Ambulatory Visit: Payer: Self-pay | Admitting: Hematology

## 2019-10-24 DIAGNOSIS — C50911 Malignant neoplasm of unspecified site of right female breast: Secondary | ICD-10-CM

## 2019-11-19 ENCOUNTER — Other Ambulatory Visit: Payer: Self-pay | Admitting: Hematology

## 2019-11-19 DIAGNOSIS — Z853 Personal history of malignant neoplasm of breast: Secondary | ICD-10-CM

## 2019-12-16 ENCOUNTER — Ambulatory Visit (INDEPENDENT_AMBULATORY_CARE_PROVIDER_SITE_OTHER): Payer: Medicare Other | Admitting: Podiatry

## 2019-12-16 ENCOUNTER — Encounter: Payer: Self-pay | Admitting: Podiatry

## 2019-12-16 ENCOUNTER — Other Ambulatory Visit: Payer: Self-pay

## 2019-12-16 ENCOUNTER — Ambulatory Visit: Payer: Medicare Other | Admitting: Podiatry

## 2019-12-16 DIAGNOSIS — B351 Tinea unguium: Secondary | ICD-10-CM | POA: Diagnosis not present

## 2019-12-16 DIAGNOSIS — L97511 Non-pressure chronic ulcer of other part of right foot limited to breakdown of skin: Secondary | ICD-10-CM

## 2019-12-16 DIAGNOSIS — M79676 Pain in unspecified toe(s): Secondary | ICD-10-CM | POA: Diagnosis not present

## 2019-12-16 NOTE — Patient Instructions (Signed)

## 2019-12-19 NOTE — Progress Notes (Signed)
Subjective: Sarah Mata presents today for follow up of painful callus(es) plantar aspect right foot. She states lesion is very painful. Denies any redness, drainage, fever, chills, nightsweats, nausea or vomiting.   Allergies  Allergen Reactions  . Tetracyclines & Related Other (See Comments)    Stomach burning     Objective: There were no vitals filed for this visit.  Vascular Examination:  Capillary refill time to digits immediate b/l, palpable DP pulses b/l, palpable PT pulses b/l, pedal hair present b/l and skin temperature gradient within normal limits b/l  Dermatological Examination: Pedal skin with normal turgor, texture and tone bilaterally, no open wounds bilaterally, no interdigital macerations bilaterally, toenails 1-5 b/l elongated, dystrophic, thickened, crumbly with subungual debris and porokeratotic lesion(s) submet head 3 right foot with tenderness to palpation. +Subdermal hemorrhage.Marland Kitchen No erythema, no edema, no drainage, no flocculence. Predebridement, lesion measures 1.3 x 1.3 cm. Postdebridement, measues 1.0 x 1.0 x 0.1 cm. No erythema, no edema, no drainage, no deep space wound.  Musculoskeletal: Normal muscle strength 5/5 to all lower extremity muscle groups bilaterally, no pain crepitus or joint limitation noted with ROM b/l and plantarflexed 3rd ray right foot  Neurological: Protective sensation intact 5/5 intact bilaterally with 10g monofilament b/l  Assessment: 1. Pain due to onychomycosis of toenail   2. Ulcer of right foot, limited to breakdown of skin ([place])      Plan: -Toenails 1-5 b/l were debrided in length and girth without iatrogenic bleeding. -Partial thickness ulcer pared without complication. Triple antibiotic ointment and band-aid applied. She may remove on tomorrow. -Patient to continue soft, supportive shoe gear daily. -Patient to report any pedal injuries to medical professional immediately. -Patient/POA to call should there be  question/concern in the interim.  Return in about 9 weeks (around 02/17/2020) for nail and callus trim.

## 2019-12-22 ENCOUNTER — Other Ambulatory Visit: Payer: Self-pay

## 2019-12-23 ENCOUNTER — Encounter: Payer: Self-pay | Admitting: Family Medicine

## 2019-12-23 ENCOUNTER — Ambulatory Visit (INDEPENDENT_AMBULATORY_CARE_PROVIDER_SITE_OTHER): Payer: Medicare Other | Admitting: Family Medicine

## 2019-12-23 ENCOUNTER — Ambulatory Visit (INDEPENDENT_AMBULATORY_CARE_PROVIDER_SITE_OTHER): Payer: Medicare Other

## 2019-12-23 VITALS — BP 160/80 | HR 76 | Temp 97.0°F | Ht 63.0 in | Wt 203.4 lb

## 2019-12-23 DIAGNOSIS — R030 Elevated blood-pressure reading, without diagnosis of hypertension: Secondary | ICD-10-CM

## 2019-12-23 DIAGNOSIS — M329 Systemic lupus erythematosus, unspecified: Secondary | ICD-10-CM | POA: Diagnosis not present

## 2019-12-23 DIAGNOSIS — R0789 Other chest pain: Secondary | ICD-10-CM

## 2019-12-23 DIAGNOSIS — G20 Parkinson's disease: Secondary | ICD-10-CM | POA: Insufficient documentation

## 2019-12-23 NOTE — Patient Instructions (Addendum)
For your chest pain, exam and history sounds like an inflammation in the carilage in your chest. I would get over the counter voltaren gel to rub in the areas that are sore and you can do this up to four times a day. If this doesn't give you much relief you can try ibuprofen or motrin with food. Just don't want you taking this a lot as it could increase risk for hurting kidneys or ulcer in stomach.   ekg looks good and checking chest xray. Come back for fasting labs. I have also ordered an echo of your heart to just look at structure and function with history of lupus and chest achyiness.   Costochondritis Costochondritis is swelling and irritation (inflammation) of the tissue (cartilage) that connects your ribs to your breastbone (sternum). This causes pain in the front of your chest. Usually, the pain:  Starts gradually.  Is in more than one rib. This condition usually goes away on its own over time. Follow these instructions at home:  Do not do anything that makes your pain worse.  If directed, put ice on the painful area: ? Put ice in a plastic bag. ? Place a towel between your skin and the bag. ? Leave the ice on for 20 minutes, 2-3 times a day.  If directed, put heat on the affected area as often as told by your doctor. Use the heat source that your doctor tells you to use, such as a moist heat pack or a heating pad. ? Place a towel between your skin and the heat source. ? Leave the heat on for 20-30 minutes. ? Take off the heat if your skin turns bright red. This is very important if you cannot feel pain, heat, or cold. You may have a greater risk of getting burned.  Take over-the-counter and prescription medicines only as told by your doctor.  Return to your normal activities as told by your doctor. Ask your doctor what activities are safe for you.  Keep all follow-up visits as told by your doctor. This is important. Contact a doctor if:  You have chills or a fever.  Your  pain does not go away or it gets worse.  You have a cough that does not go away. Get help right away if:  You are short of breath. This information is not intended to replace advice given to you by your health care provider. Make sure you discuss any questions you have with your health care provider. Document Revised: 11/20/2017 Document Reviewed: 02/29/2016 Elsevier Patient Education  2020 [employer].

## 2019-12-23 NOTE — Progress Notes (Signed)
Patient: Sarah Mata MRN: [id number] DOB: 1941/06/19 PCP: Orma Flaming, MD     Subjective:  Chief Complaint  Patient presents with  . Establish Care  . Chest Pain    HPI: The patient is a 79 y.o. female who presents today for establishing care. She has medical history of PD, systemic lupus, history of right  breast cancer in remission. She has no known history of HTN, diabetes, high cholesterol. 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   She has complaints of chest pain. It started about one month ago. It is substernal and is more an ache in nature. She also has the same ache around her sternal notch. Pain lasts about 5 minutes and is not severe. She describes it as an ache, no pressure/weight/heaviness type feeling. No pain down left arm, up jaw. Denies sweating, SOB. She will have it with raising her hands up and then coming down. Deep inspiration does not cause her pain. Does have some pain with palpation. No increased pain with exertion. She does have family history of CAD in her father and brother. She has never smoked. Elevated blood pressure x 3 checks today. She has history of atherosclerosis of aorta from CT in 2018.    Hx of systemic lupus, but does not know what organ is affected. No longer follows rheum. Not on any medication.   Vit. D deficiency: recent lab in 07/2019, wnl. On replacement.   The ASCVD Risk score Mikey Bussing DC Jr., et al., 2013) failed to calculate for the following reasons:   Cannot find a previous HDL lab   Cannot find a previous total cholesterol lab   Review of Systems  Constitutional: Negative for chills, fatigue and fever.  HENT: Negative for sinus pressure, sinus pain and sneezing.   Eyes: Positive for redness. Negative for pain and itching.  Respiratory: Negative for chest tightness, shortness of breath and wheezing.   Cardiovascular: Negative for chest pain, palpitations and leg swelling.       Intermittent chest pain. With  holding hands up and down.  Gastrointestinal: Negative for diarrhea, nausea and vomiting.  Endocrine: Negative for cold intolerance and heat intolerance.  Genitourinary: Negative for flank pain, frequency and pelvic pain.    Allergies Patient is allergic to tetracyclines & related.  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  Surgical History Patient  has a past surgical history that includes Foot surgery (Right, 2010); Broken Nose; Breast biopsy; Reduction mammaplasty (Bilateral, 2015); and Breast lumpectomy (Right, 2015).  Family History Pateint's family history includes Breast cancer in her mother and sister; Cancer in her mother; Diabetes in her sister; Heart disease in her father; Hypertension in her mother and sister.  Social History Patient  reports that she has never smoked. She has never used smokeless tobacco. She reports current alcohol use. She reports that she does not use drugs.    Objective: Vitals:   12/23/19 1308 12/23/19 1340  BP: (!) 150/82 (!) 160/80  Pulse: 76   Temp: (!) 97 F (36.1 C)   TempSrc: Temporal   SpO2: 94%   Weight: 203 lb 6.4 oz (92.3 kg)   Height: 5\' 3"  (1.6 m)     Body mass index is 36.03 kg/m.  Physical Exam Vitals reviewed.  Constitutional:      Appearance: She is well-developed.  She is obese.  HENT:     Head: Normocephalic and atraumatic.     Right Ear: External ear normal.     Left Ear: External ear normal.  Eyes:     Extraocular Movements: Extraocular movements intact.     Conjunctiva/sclera: Conjunctivae normal.     Pupils: Pupils are equal, round, and reactive to light.  Neck:     Thyroid: No thyromegaly.  Cardiovascular:     Rate and Rhythm: Normal rate and regular rhythm.     Pulses:          Carotid pulses are 0 on the left  side.    Heart sounds: Normal heart sounds. No murmur.  Pulmonary:     Effort: Pulmonary effort is normal.     Breath sounds: Normal breath sounds.  Chest:     Chest wall: Tenderness (inferior end of sternum with palpation) present.  Abdominal:     General: Bowel sounds are normal. There is no distension.     Palpations: Abdomen is soft.     Tenderness: There is no abdominal tenderness.  Musculoskeletal:     Cervical back: Normal range of motion and neck supple.  Lymphadenopathy:     Cervical: No cervical adenopathy.  Skin:    General: Skin is warm and dry.     Findings: No rash.  Neurological:     General: No focal deficit present.     Mental Status: She is alert and oriented to person, place, and time.     Cranial Nerves: No cranial nerve deficit.     Coordination: Coordination normal.     Deep Tendon Reflexes: Reflexes normal.     Comments: Resting tremor of right hand  Psychiatric:        Mood and Affect: Mood normal.        Behavior: Behavior normal.      Office Visit from 12/23/2019 in [place]  PHQ-2 Total Score  0     Ekg: NSR with rate of 75. No st wave abnormalities.  CXR: wnl. No acute processes.      Assessment/plan: 1. Other chest pain Appears to be MSK in nature/costochondritis. Work up reassuring. Discussed treatment with her and recommended topical voltaren, tylenol. If needed can take motrin very judiciously. Discussed diagnosis in detail. Will have her come for fasting labs and check cholesterol. Blood pressure above goal and requested she start a log at home and return in 3 months for recheck. With history of systemic lupus, checking echo/ANA. Strict ER precautions given.  - DG Chest 2 View; Future - EKG 12-Lead - ECHOCARDIOGRAM COMPLETE; Future - Comprehensive metabolic panel; Future - CBC with Differential/Platelet; Future - Lipid panel; Future  2. Systemic lupus erythematosus, unspecified SLE type, unspecified organ  involvement status ([place])  - ECHOCARDIOGRAM COMPLETE; Future - TSH; Future - ANA  3. Elevated blood pressure - start home log. Discussed which cuff to buy. Low sodium diet, exercise as she can and weight loss. She will f/u in 3 months for recheck.   Total time of encounter: 45 minutes total time of encounter, including 30 minutes spent in face-to-face patient care. This time includes coordination of care and counseling regarding new symptom of chest pain, work up, treatment as well as addressing chronic health issues/medication/HM/follow up. Remainder of non-face-to-face time involved reviewing chart documents/testing relevant to the patient encounter and documentation in the medical record.  This visit occurred during the SARS-CoV-2 public health emergency.  Safety protocols were in place,  including screening questions prior to the visit, additional usage of staff PPE, and extensive cleaning of exam room while observing appropriate contact time as indicated for disinfecting solutions.     Return in about 3 months (around 03/21/2020) for blood pressure .   Orma Flaming, MD [place]   12/24/2019

## 2019-12-24 ENCOUNTER — Telehealth: Payer: Self-pay

## 2019-12-24 NOTE — Telephone Encounter (Signed)
It is just her CXR and it is normal. She Is supposed to come back for fasting labs.  Orma Flaming, MD [place]

## 2019-12-24 NOTE — Telephone Encounter (Signed)
Patient  Is calling because she would like to know her lab results. Patient states that she is having trouble logging into my chart

## 2019-12-24 NOTE — Telephone Encounter (Signed)
Called patient gave results and made lab app.

## 2019-12-25 ENCOUNTER — Other Ambulatory Visit: Payer: Self-pay

## 2019-12-25 ENCOUNTER — Other Ambulatory Visit (INDEPENDENT_AMBULATORY_CARE_PROVIDER_SITE_OTHER): Payer: Medicare Other

## 2019-12-25 DIAGNOSIS — R0789 Other chest pain: Secondary | ICD-10-CM | POA: Diagnosis not present

## 2019-12-25 DIAGNOSIS — M329 Systemic lupus erythematosus, unspecified: Secondary | ICD-10-CM | POA: Diagnosis not present

## 2019-12-25 LAB — CBC WITH DIFFERENTIAL/PLATELET
Basophils Absolute: 0.1 10*3/uL (ref 0.0–0.1)
Basophils Relative: 1.2 % (ref 0.0–3.0)
Eosinophils Absolute: 0.2 10*3/uL (ref 0.0–0.7)
Eosinophils Relative: 3.9 % (ref 0.0–5.0)
HCT: 43.7 % (ref 36.0–46.0)
Hemoglobin: 14.7 g/dL (ref 12.0–15.0)
Lymphocytes Relative: 28.1 % (ref 12.0–46.0)
Lymphs Abs: 1.5 10*3/uL (ref 0.7–4.0)
MCHC: 33.6 g/dL (ref 30.0–36.0)
MCV: 89.8 fl (ref 78.0–100.0)
Monocytes Absolute: 0.6 10*3/uL (ref 0.1–1.0)
Monocytes Relative: 11.9 % (ref 3.0–12.0)
Neutro Abs: 2.9 10*3/uL (ref 1.4–7.7)
Neutrophils Relative %: 54.9 % (ref 43.0–77.0)
Platelets: 175 10*3/uL (ref 150.0–400.0)
RBC: 4.86 Mil/uL (ref 3.87–5.11)
RDW: 13.6 % (ref 11.5–15.5)
WBC: 5.2 10*3/uL (ref 4.0–10.5)

## 2019-12-25 LAB — LIPID PANEL
Cholesterol: 237 mg/dL — ABNORMAL HIGH (ref 0–200)
HDL: 51.1 mg/dL (ref >39.00)
LDL Cholesterol: 154 mg/dL — ABNORMAL HIGH (ref 0–99)
NonHDL: 186.17
Total CHOL/HDL Ratio: 5
Triglycerides: 159 mg/dL — ABNORMAL HIGH (ref 0.0–149.0)
VLDL: 31.8 mg/dL (ref 0.0–40.0)

## 2019-12-25 LAB — COMPREHENSIVE METABOLIC PANEL
ALT: 18 U/L (ref 0–35)
AST: 22 U/L (ref 0–37)
Albumin: 4 g/dL (ref 3.5–5.2)
Alkaline Phosphatase: 105 U/L (ref 39–117)
BUN: 17 mg/dL (ref 6–23)
CO2: 29 mEq/L (ref 19–32)
Calcium: 9.8 mg/dL (ref 8.4–10.5)
Chloride: 103 mEq/L (ref 96–112)
Creatinine, Ser: 1.16 mg/dL (ref 0.40–1.20)
GFR: 45.41 mL/min — ABNORMAL LOW (ref >60.00)
Glucose, Bld: 97 mg/dL (ref 70–99)
Potassium: 4.7 mEq/L (ref 3.5–5.1)
Sodium: 140 mEq/L (ref 135–145)
Total Bilirubin: 0.7 mg/dL (ref 0.2–1.2)
Total Protein: 7 g/dL (ref 6.0–8.3)

## 2019-12-25 LAB — TSH: TSH: 3.38 u[IU]/mL (ref 0.35–4.50)

## 2019-12-28 ENCOUNTER — Encounter: Payer: Self-pay | Admitting: Family Medicine

## 2019-12-28 DIAGNOSIS — E785 Hyperlipidemia, unspecified: Secondary | ICD-10-CM | POA: Insufficient documentation

## 2019-12-28 LAB — ANA, IFA COMPREHENSIVE PANEL
Anti Nuclear Antibody (ANA): POSITIVE — AB
ENA SM Ab Ser-aCnc: <1 AI
SM/RNP: 5.4 AI — AB
SSA (Ro) (ENA) Antibody, IgG: 3 AI — AB
SSB (La) (ENA) Antibody, IgG: <1 AI
Scleroderma (Scl-70) (ENA) Antibody, IgG: <1 AI
ds DNA Ab: <1 IU/mL

## 2019-12-28 LAB — ANTI-NUCLEAR AB-TITER (ANA TITER): ANA Titer 1: 1:80 {titer} — ABNORMAL HIGH

## 2019-12-29 ENCOUNTER — Ambulatory Visit: Payer: Medicare Other | Admitting: Podiatry

## 2019-12-29 NOTE — Telephone Encounter (Signed)
Spoke with pt about TSH question. Also verified f/u appointment times with her. Pt voiced understanding.

## 2019-12-29 NOTE — Telephone Encounter (Signed)
LVM for patient to call the office back. 

## 2020-01-04 ENCOUNTER — Other Ambulatory Visit (HOSPITAL_COMMUNITY): Payer: Medicare Other

## 2020-01-06 ENCOUNTER — Ambulatory Visit (HOSPITAL_COMMUNITY): Payer: Medicare Other | Attending: Cardiology

## 2020-01-06 ENCOUNTER — Other Ambulatory Visit: Payer: Self-pay

## 2020-01-06 DIAGNOSIS — R0789 Other chest pain: Secondary | ICD-10-CM | POA: Insufficient documentation

## 2020-01-06 DIAGNOSIS — M329 Systemic lupus erythematosus, unspecified: Secondary | ICD-10-CM | POA: Diagnosis present

## 2020-01-08 ENCOUNTER — Encounter: Payer: Self-pay | Admitting: Family Medicine

## 2020-01-14 ENCOUNTER — Telehealth: Payer: Self-pay | Admitting: Hematology

## 2020-01-14 ENCOUNTER — Other Ambulatory Visit: Payer: Self-pay | Admitting: Hematology

## 2020-01-14 ENCOUNTER — Encounter: Payer: Self-pay | Admitting: Hematology

## 2020-01-14 ENCOUNTER — Ambulatory Visit
Admission: RE | Admit: 2020-01-14 | Discharge: 2020-01-14 | Disposition: A | Discharge status: Home / Self Care | Payer: Medicare Other | Source: Ambulatory Visit | Attending: Hematology | Admitting: Hematology

## 2020-01-14 ENCOUNTER — Other Ambulatory Visit: Payer: Self-pay

## 2020-01-14 DIAGNOSIS — N644 Mastodynia: Secondary | ICD-10-CM

## 2020-01-14 DIAGNOSIS — Z853 Personal history of malignant neoplasm of breast: Secondary | ICD-10-CM

## 2020-01-14 NOTE — Telephone Encounter (Signed)
R/s appt per my chart message. Called pt and left message with new appt date and time

## 2020-01-15 ENCOUNTER — Other Ambulatory Visit: Payer: Self-pay

## 2020-01-15 NOTE — Progress Notes (Signed)
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 Grade 3, Ki-67 20%.   03/2014 Miscellaneous   mammaprint reviewed high-risk luminal type   04/2014 -  Radiation Therapy   Patient underwent radiation to the right chest wall   08/2014 -  Anti-estrogen oral therapy   Letrozole 2.5 mg started in 08/2014 and due to poor tolerance switched to Anastrozole 1.0 mg, put on hold due to elevated liver enzyme since 05/21/2017 and restarted on 06/26/17   08/06/2017 Pathology Results   Diagnosis, colonoscopy 1. Surgical [P], right colon biopsies - COLONIC MUCOSA WITH BENIGN LYMPHOID AGGREGATES. - NO MICROSCOPIC COLITIS, ACTIVE INFLAMMATION, CHRONIC CHANGES OR GRANULOMAS. 2. Surgical [P], left colon biopsies - COLONIC MUCOSA WITH BENIGN LYMPHOID AGGREGATES. - NO MICROSCOPIC COLITIS, ACTIVE INFLAMMATION, CHRONIC CHANGES OR GRANULOMAS.   08/06/2017 Procedure   Colonoscopy by Dr. Silverio Decamp on 08/06/17 IMPRESSION - Diverticulosis  in the sigmoid colon and in the descending colon. - Non-bleeding internal hemorrhoids. - The entire examined colon is normal. Biopsied.    12/20/2017 Mammogram   There are stable lumpectomy changes in the upper-outer right breast. No mass, nonsurgical distortion, or suspicious microcalcification is identified on the right to suggest malignancy.  There is subtle palpable thickening in the 12 o'clock to 1 o'clock left breast just superior to the nipple.   IMPRESSION: Architectural distortion in the 12 o'clock region of the left breast. Malignancy, postsurgical scarring, or complex sclerosing lesion are considerations.   12/20/2017 Breast US   Targeted ultrasound is performed, showing a hypoechoic irregular mass with shadowing at 12 o'clock position 1-2 cm from the nipple measuring approximately 1.3 x 1.6 x 0.7 cm. Ultrasound of the region of patient concern discovered today while she was here in the office at 7:30 position shows normal fatty tissue.   12/23/2017 Procedure   Clip placement FINDINGS: Mammographic images were obtained following stereotactic guided biopsy of left breast distortion. The coil shaped biopsy marking clip is well positioned at the site of distortion in the upper slightly inner quadrant of the left breast.    12/23/2017 Pathology Results   Diagnosis Breast, left, needle core biopsy, upper inner quadrant - DENSE FIBROSIS AND PIGMENTED HISTIOCYTES CONSISTENT WITH PREVIOUS SURGICAL SITE - FOCAL FIBROCYSTIC CHANGES - NO MALIGNANCY IDENTIFIED      CURRENT THERAPY:  Letrozole started in 08/2014 and due to poor tolerance switched to Anastrozole, put on hold due to elevated liver enzyme since 05/21/2017 and restarted on 06/26/17   INTERVAL HISTORY:  Sarah Mata is here for a follow up of right  breast cancer. She was last seen by me 6 months ago. She presents to the clinic alone.  She is overall clinically stable.  She has developed intermittent mid chest pain with tenderness  in the past months, was evaluated by her primary care physician, and given topical and oral NSAIDs.  She also has a large callus in the right foot, for which she sees podiatrist.  Her Parkinson disease is stable, she is able to walk independently and functions at home.  She is tolerating anastrozole well overall, no significant hot flash, mild arthralgia stable.  Review of systems otherwise negative.   MEDICAL HISTORY:  Past Medical History:  Diagnosis Date  . Allergy   . Arthritis   . Breast cancer ([place])   . Heart murmur   . Lupus ([place])   . Personal history of chemotherapy   . Personal history of radiation therapy   . Tremor of right hand 08/02/2017    SURGICAL HISTORY: Past Surgical History:  Procedure Laterality Date  . BREAST BIOPSY    . BREAST LUMPECTOMY Right 2015  . Broken Nose     Patient fell.  Marland Kitchen FOOT SURGERY Right 2010  . REDUCTION MAMMAPLASTY Bilateral 2015    I have reviewed the social history and family history with the patient and they are unchanged from previous note.  ALLERGIES:  is allergic to tetracyclines & related.  MEDICATIONS:  Current Outpatient Medications  Medication Sig Dispense Refill  . Omega-3 Fatty Acids (EQL OMEGA 3 FISH OIL) 1200 MG CAPS Take 1 capsule by mouth daily.    Marland Kitchen alendronate (FOSAMAX) 70 MG tablet Take 1 tablet (70 mg total) by mouth once a week. Take with a full glass of water on an empty stomach. 4 tablet 5  . anastrozole (ARIMIDEX) 1 MG tablet TAKE 1 TABLET DAILY 90 tablet 3  . aspirin EC 81 MG tablet Take 81 mg by mouth daily.    Marland Kitchen Bioflavonoid Products (ESTER-C) TABS Take 1 tablet by mouth daily. Taking AM and PM    . CALCIUM & MAGNESIUM CARBONATES PO Take 3 tablets by mouth daily.    . Cholecalciferol (VITAMIN D3) 2000 units capsule Take 5,000 Units by mouth daily.     . Coenzyme Q10 (CO Q-10) 100 MG CAPS Take by mouth. Two caps daily    . diclofenac Sodium (VOLTAREN) 1 % GEL Apply 2 g topically 4 (four) times daily. 300 g 1  .  ketorolac (ACULAR) 0.5 % ophthalmic solution     . Magnesium 125 MG CAPS     . omeprazole (PRILOSEC) 20 MG capsule omeprazole 20 mg capsule,delayed release    . prednisoLONE acetate (PRED FORTE) 1 % ophthalmic suspension INSTILL 1 DROP INTO LEFT EYE 4 TIMES A DAY    . TURMERIC CURCUMIN PO Take by mouth.     No current facility-administered medications for this visit.    PHYSICAL EXAMINATION: ECOG PERFORMANCE STATUS: 2 - Symptomatic, <50% confined to bed  Vitals:   01/21/20 1356  BP: (!) 158/77  Pulse: 67  Resp: 17  Temp: 98.5 F (36.9 C)  SpO2: 97%   Filed Weights   01/21/20 1356  Weight: 205 lb 3.2 oz (93.1 kg)    GENERAL:alert, no distress and comfortable SKIN: skin color, texture, turgor are normal, no rashes or significant lesions EYES: normal, Conjunctiva are pink and non-injected, sclera clear NECK: supple, thyroid normal size, non-tender, without nodularity LYMPH:  no palpable lymphadenopathy in the cervical, axillary  LUNGS: clear to auscultation and  percussion with normal breathing effort HEART: regular rate & rhythm and no murmurs and no lower extremity edema ABDOMEN:abdomen soft, non-tender and normal bowel sounds Musculoskeletal:no cyanosis of digits and no clubbing  NEURO: alert & oriented x 3 with fluent speech, no focal motor/sensory deficits Breasts: Breast inspection showed them to be symmetrical with no nipple discharge.  She had a bilateral breast reduction, surgical incisions have healed very well.  There is a 0.5 cm subcutaneous nodule at the 9 o'clock position in the right breast, a few centimeters from nipple, which has been stable since surgery.  No other palpable masses in brasts and axilla revealed no obvious mass that I could appreciate.   LABORATORY DATA:  I have reviewed the data as listed CBC Latest Ref Rng & Units 01/21/2020 12/25/2019 07/24/2019  WBC 4.0 - 10.5 K/uL 5.8 5.2 7.1  Hemoglobin 12.0 - 15.0 g/dL 13.8 14.7 14.1  Hematocrit 36.0 - 46.0 %  40.6 43.7 42.4  Platelets 150 - 400 K/uL 151 175.0 166     CMP Latest Ref Rng & Units 01/21/2020 12/25/2019 07/24/2019  Glucose 70 - 99 mg/dL 87 97 134(H)  BUN 8 - 23 mg/dL '14 17 21  '$ Creatinine 0.44 - 1.00 mg/dL 0.97 1.16 1.00  Sodium 135 - 145 mmol/L 135 140 134(L)  Potassium 3.5 - 5.1 mmol/L 4.3 4.7 4.4  Chloride 98 - 111 mmol/L 102 103 102  CO2 22 - 32 mmol/L 27 29 21(L)  Calcium 8.9 - 10.3 mg/dL 9.3 9.8 9.1  Total Protein 6.5 - 8.1 g/dL 7.1 7.0 7.3  Total Bilirubin 0.3 - 1.2 mg/dL 0.5 0.7 0.4  Alkaline Phos 38 - 126 U/L 111 105 126  AST 15 - 41 U/L '27 22 23  '$ ALT 0 - 44 U/L '24 18 23      '$ RADIOGRAPHIC STUDIES: I have personally reviewed the radiological images as listed and agreed with the findings in the report. No results found.   ASSESSMENT & PLAN:  Sarah Mata is a 79 y.o. female with    1. Right breast cancer stage IIA, ER+/PR+/HER2-, mammaprint high risk -She was diagnosed in 02/2014. She is s/p right lumpectomy and adjuvant radiation. She startedadjuvant letrozolein 08/2014, tolerating well overall.  -Wepreviouslydiscussed the risk of cancer recurrence. She has high risk for recurrence based on the mammaprint result. -Due to shaking of right hand, she is very concerned this could be side effect of the letrozole. I switched her to Anastrozole '1mg'$  to take once dailyin 05/2017. Plan to complete 7 years in mid-late 2022 if she can tolerate well.  -She is clinically stable, tolerating anastrozole well.  We will continue.  -Lab and follow-up in 6 months.  2. 4. Osteoporosis  -Ipreviouslydiscussed the potential osteopenia and osteoporosis from aromatase inhibitor -Her 12/2018 DEXA shows osteoporosis with lowest T-score -2.6 at forearm radius -Again discussed the benefit and potential side effect of aromatase inhibitor, and encouraged her to consider.  I discussed option of oral Fosamax, Prolia injection, or Zometa infusion.  Side effects especially risk of jaw necrosis,  bone pain, fatigue were discussed with her.  After extensive discussion, she is willing to try.  She is going to see her dentist next month, I called in Fosamax to her pharmacy today. -I encouraged her to continue calcium and vitamin D and to avoid fall. Given her Parkinson's I suggest she use cane or walker if needed.    3. Transaminates -this was discovered on routine lab work on 05/21/2017. Prior CT abdomen was  negative  -Her husband had chronic hepatitis, and passed away in 02-18-2011, I recommend her to check for hepB and C -autoimmune hepatitis is also possible due to her lupus.  -she had liver work up by Dr. Silverio Decamp. She had a normal CT and colonoscopy -resolved   4. Lupus -She will continue f/u with Dr. Estanislado Pandy  5. Parkinson's Disease  -Followed by Dr. Carles Collet, patient declined carbidopa-levodopa; opted for natural remedies including physical and mental exercise -She is currently on observation, stable.  -She does not ambulate in straight line. She will see Dr. Carles Collet in Fall 2020   PLAN Continueanastrozole  I called in Fosamax today, she will see her dentist next months and start after the visit Lab and f/u in 6 months, she prefers in person visit, not virtual visit    No problem-specific Assessment & Plan notes found for this encounter.   No orders of the defined types were placed in this encounter.  All questions were answered. The patient knows to call the clinic with any problems, questions or concerns. No barriers to learning was detected. The total time spent in the appointment was 30 minutes.     Truitt Merle, MD 01/21/2020   I, Joslyn Devon, am acting as scribe for Truitt Merle, MD.   I have reviewed the above documentation for accuracy and completeness, and I agree with the above.

## 2020-01-18 ENCOUNTER — Other Ambulatory Visit: Payer: Self-pay | Admitting: Family Medicine

## 2020-01-18 MED ORDER — DICLOFENAC SODIUM 1 % EX GEL: 2.0000 g | Freq: Four times a day (QID) | CUTANEOUS | 1 refills | Status: DC

## 2020-01-21 ENCOUNTER — Inpatient Hospital Stay: Payer: Medicare Other | Attending: Hematology

## 2020-01-21 ENCOUNTER — Encounter: Payer: Self-pay | Admitting: Hematology

## 2020-01-21 ENCOUNTER — Inpatient Hospital Stay (HOSPITAL_BASED_OUTPATIENT_CLINIC_OR_DEPARTMENT_OTHER): Payer: Medicare Other | Admitting: Hematology

## 2020-01-21 ENCOUNTER — Other Ambulatory Visit: Payer: Self-pay

## 2020-01-21 VITALS — BP 158/77 | HR 67 | Temp 98.5°F | Resp 17 | Ht 63.0 in | Wt 205.2 lb

## 2020-01-21 DIAGNOSIS — G20 Parkinson's disease: Secondary | ICD-10-CM | POA: Insufficient documentation

## 2020-01-21 DIAGNOSIS — M329 Systemic lupus erythematosus, unspecified: Secondary | ICD-10-CM | POA: Diagnosis not present

## 2020-01-21 DIAGNOSIS — Z170 Estrogen receptor positive status [ER+]: Secondary | ICD-10-CM | POA: Diagnosis not present

## 2020-01-21 DIAGNOSIS — Z79811 Long term (current) use of aromatase inhibitors: Secondary | ICD-10-CM | POA: Insufficient documentation

## 2020-01-21 DIAGNOSIS — C50911 Malignant neoplasm of unspecified site of right female breast: Secondary | ICD-10-CM

## 2020-01-21 DIAGNOSIS — E559 Vitamin D deficiency, unspecified: Secondary | ICD-10-CM

## 2020-01-21 DIAGNOSIS — M810 Age-related osteoporosis without current pathological fracture: Secondary | ICD-10-CM | POA: Diagnosis not present

## 2020-01-21 LAB — COMPREHENSIVE METABOLIC PANEL
ALT: 24 U/L (ref 0–44)
AST: 27 U/L (ref 15–41)
Albumin: 3.7 g/dL (ref 3.5–5.0)
Alkaline Phosphatase: 111 U/L (ref 38–126)
Anion gap: 6 (ref 5–15)
BUN: 14 mg/dL (ref 8–23)
CO2: 27 mmol/L (ref 22–32)
Calcium: 9.3 mg/dL (ref 8.9–10.3)
Chloride: 102 mmol/L (ref 98–111)
Creatinine, Ser: 0.97 mg/dL (ref 0.44–1.00)
GFR calc Af Amer: >60 mL/min (ref >60)
GFR calc non Af Amer: 57 mL/min — ABNORMAL LOW (ref >60)
Glucose, Bld: 87 mg/dL (ref 70–99)
Potassium: 4.3 mmol/L (ref 3.5–5.1)
Sodium: 135 mmol/L (ref 135–145)
Total Bilirubin: 0.5 mg/dL (ref 0.3–1.2)
Total Protein: 7.1 g/dL (ref 6.5–8.1)

## 2020-01-21 LAB — CBC WITH DIFFERENTIAL/PLATELET
Abs Immature Granulocytes: 0 10*3/uL (ref 0.00–0.07)
Basophils Absolute: 0.1 10*3/uL (ref 0.0–0.1)
Basophils Relative: 1 %
Eosinophils Absolute: 0.2 10*3/uL (ref 0.0–0.5)
Eosinophils Relative: 3 %
HCT: 40.6 % (ref 36.0–46.0)
Hemoglobin: 13.8 g/dL (ref 12.0–15.0)
Immature Granulocytes: 0 %
Lymphocytes Relative: 24 %
Lymphs Abs: 1.4 10*3/uL (ref 0.7–4.0)
MCH: 29.8 pg (ref 26.0–34.0)
MCHC: 34 g/dL (ref 30.0–36.0)
MCV: 87.7 fL (ref 80.0–100.0)
Monocytes Absolute: 0.7 10*3/uL (ref 0.1–1.0)
Monocytes Relative: 13 %
Neutro Abs: 3.5 10*3/uL (ref 1.7–7.7)
Neutrophils Relative %: 59 %
Platelets: 151 10*3/uL (ref 150–400)
RBC: 4.63 MIL/uL (ref 3.87–5.11)
RDW: 13.1 % (ref 11.5–15.5)
WBC: 5.8 10*3/uL (ref 4.0–10.5)
nRBC: 0 % (ref 0.0–0.2)

## 2020-01-21 LAB — VITAMIN D 25 HYDROXY (VIT D DEFICIENCY, FRACTURES): Vit D, 25-Hydroxy: 49.79 ng/mL (ref 30–100)

## 2020-01-21 MED ORDER — ALENDRONATE SODIUM 70 MG PO TABS: 70.0000 mg | ORAL_TABLET | ORAL | 5 refills | Status: DC

## 2020-01-22 ENCOUNTER — Telehealth: Payer: Self-pay | Admitting: Hematology

## 2020-01-22 ENCOUNTER — Ambulatory Visit: Payer: Medicare Other | Admitting: Hematology

## 2020-01-22 ENCOUNTER — Telehealth: Payer: Self-pay

## 2020-01-22 ENCOUNTER — Other Ambulatory Visit: Payer: Medicare Other

## 2020-01-22 LAB — CANCER ANTIGEN 27.29: CA 27.29: 51.1 U/mL — ABNORMAL HIGH (ref 0.0–38.6)

## 2020-01-22 NOTE — Telephone Encounter (Signed)
I spoke with Sarah Mata and let her know Dr. Burr Medico is ok with her taking the algaecal.  I let her know she needs to continue with the vit d as well.  She verbalized understanding.

## 2020-01-22 NOTE — Telephone Encounter (Signed)
That's fine, and make sure she is on VitD also, thanks   Truitt Merle MD

## 2020-01-22 NOTE — Telephone Encounter (Signed)
Ms Sarah Mata returned my call and we reviewed her lab results.  She stated that she is not going to start the fosamax as there are too many side effects.  She would like to take algaecal as a calcium supplement and was wanting Dr. Ernestina Penna advice.

## 2020-01-22 NOTE — Telephone Encounter (Signed)
I left vm regarding lab results.

## 2020-01-22 NOTE — Telephone Encounter (Signed)
-----   Message from Truitt Merle, MD sent at 01/22/2020  7:50 AM EST ----- Please let pt know her lab results, VitD normal, continue current VitD supplement, she is doing good job on that. Her CA27.29 has been mildly elevated in the past few years, overall stable, likely nonspecific, no concerns, continue monitoring. Thanks  Truitt Merle  01/22/2020

## 2020-01-22 NOTE — Telephone Encounter (Signed)
Scheduled appt per 3/4 los.  Sent a message to HIM pool to get a calendar mailed out. 

## 2020-01-26 ENCOUNTER — Telehealth: Payer: Self-pay | Admitting: Family Medicine

## 2020-01-26 NOTE — Telephone Encounter (Signed)
Rx [facility] is calling in asking to speak with someone about a coverage issue on one the medications and asked for a returned call.

## 2020-01-26 NOTE — Telephone Encounter (Signed)
Spoke with [facility] to verify medications considered by plan.   List of preferred medications include:   Ibuprofen tabs 400 mg ,600 mg, 800 mg  Diclofenac Sodium gel 25  Diclofenac Sodium DR tabs 50 or 75 mg  Meloxicam 7.5 mg or 15 mg  Diclofenac topical solution 150 ml  [facility] [phone] invoice [id number]

## 2020-01-27 ENCOUNTER — Telehealth: Payer: Self-pay | Admitting: Family Medicine

## 2020-01-27 NOTE — Telephone Encounter (Signed)
Called patient she will pick up over the counter.

## 2020-01-27 NOTE — Telephone Encounter (Signed)
error 

## 2020-01-27 NOTE — Telephone Encounter (Signed)
Can you see what medication they are having an issue with? If it's the voltaren gel, let her know this Is over the counter now.  Thanks,  Dr. Rogers Blocker

## 2020-01-29 ENCOUNTER — Encounter: Payer: Self-pay | Admitting: Podiatry

## 2020-01-29 ENCOUNTER — Other Ambulatory Visit: Payer: Self-pay

## 2020-01-29 ENCOUNTER — Ambulatory Visit (INDEPENDENT_AMBULATORY_CARE_PROVIDER_SITE_OTHER): Payer: Medicare Other | Admitting: Podiatry

## 2020-01-29 VITALS — Temp 97.7°F

## 2020-01-29 DIAGNOSIS — L97511 Non-pressure chronic ulcer of other part of right foot limited to breakdown of skin: Secondary | ICD-10-CM | POA: Diagnosis not present

## 2020-01-29 NOTE — Patient Instructions (Addendum)
[email or web address]  Keep your appointment for 02/17/2020.   Corns and Calluses Corns are small areas of thickened skin that occur on the top, sides, or tip of a toe. They contain a cone-shaped core with a point that can press on a nerve below. This causes pain.  Calluses are areas of thickened skin that can occur anywhere on the body, including the hands, fingers, palms, soles of the feet, and heels. Calluses are usually larger than corns. What are the causes? Corns and calluses are caused by rubbing (friction) or pressure, such as from shoes that are too tight or do not fit properly. What increases the risk? Corns are more likely to develop in people who have misshapen toes (toe deformities), such as hammer toes. Calluses can occur with friction to any area of the skin. They are more likely to develop in people who:  Work with their hands.  Wear shoes that fit poorly, are too tight, or are high-heeled.  Have toe deformities. What are the signs or symptoms? Symptoms of a corn or callus include:  A hard growth on the skin.  Pain or tenderness under the skin.  Redness and swelling.  Increased discomfort while wearing tight-fitting shoes, if your feet are affected. If a corn or callus becomes infected, symptoms may include:  Redness and swelling that gets worse.  Pain.  Fluid, blood, or pus draining from the corn or callus. How is this diagnosed? Corns and calluses may be diagnosed based on your symptoms, your medical history, and a physical exam. How is this treated? Treatment for corns and calluses may include:  Removing the cause of the friction or pressure. This may involve: ? Changing your shoes. ? Wearing shoe inserts (orthotics) or other protective layers in your shoes, such as a corn pad. ? Wearing gloves.  Applying medicine to the skin (topical medicine) to help soften skin in the hardened, thickened areas.  Removing layers of dead skin with a file to reduce the size  of the corn or callus.  Removing the corn or callus with a scalpel or laser.  Taking antibiotic medicines, if your corn or callus is infected.  Having surgery, if a toe deformity is the cause. Follow these instructions at home:   Take over-the-counter and prescription medicines only as told by your health care provider.  If you were prescribed an antibiotic, take it as told by your health care provider. Do not stop taking it even if your condition starts to improve.  Wear shoes that fit well. Avoid wearing high-heeled shoes and shoes that are too tight or too loose.  Wear any padding, protective layers, gloves, or orthotics as told by your health care provider.  Soak your hands or feet and then use a file or pumice stone to soften your corn or callus. Do this as told by your health care provider.  Check your corn or callus every day for symptoms of infection. Contact a health care provider if you:  Notice that your symptoms do not improve with treatment.  Have redness or swelling that gets worse.  Notice that your corn or callus becomes painful.  Have fluid, blood, or pus coming from your corn or callus.  Have new symptoms. Summary  Corns are small areas of thickened skin that occur on the top, sides, or tip of a toe.  Calluses are areas of thickened skin that can occur anywhere on the body, including the hands, fingers, palms, and soles of the feet. Calluses are usually larger  than corns.  Corns and calluses are caused by rubbing (friction) or pressure, such as from shoes that are too tight or do not fit properly.  Treatment may include wearing any padding, protective layers, gloves, or orthotics as told by your health care provider. This information is not intended to replace advice given to you by your health care provider. Make sure you discuss any questions you have with your health care provider. Document Revised: 02/25/2019 Document Reviewed: 09/18/2017 Elsevier  Patient Education  2020 [employer].

## 2020-02-04 NOTE — Progress Notes (Signed)
  Subjective:  Patient ID: Sarah Mata, female    DOB: 1941-03-21,  MRN: [id number]  Chief Complaint  Patient presents with  . Foot Ulcer    6 week follow up right foot ulcer. Pt stated, "I have 10/10 pain while walking. I'm worried about losing my foot. No redness/drainage/foul odors/fever/chills/N&V".    79 y.o. female presents for follow up of right foot ulceration.  Hx confirmed with patient.  Wound location: partial thickness ulcer submet head 3 right foot on last visit  She states she has been able to see a doctor monthly when she was living in [place]. She has had no redness, drainage or swelling from the area. No fever, chills, nightsweats, nausea or vomiting.  Objective:  Physical Exam: Vitals:   01/29/20 1554  Temp: 97.7 F (36.5 C)     79 y.o. Caucasian  female WD, WN in NAD. AAO x 3.  Capillary refill time to digits immediate b/l. Palpable DP pulses b/l. Palpable PT pulses b/l. Pedal hair present b/l. Skin temperature gradient within normal limits b/l.  Pedal skin with normal turgor, texture and tone bilaterally. No open wounds bilaterally. No interdigital macerations bilaterally. Toenails recently debrided and well maintained.  No images are attached to the encounter. Wound Location: submet head 3 right foot Wound Measurement: 1.0 x 1.0 cm Wound Base: epidermis Peri-wound: Normal Exudate: None: wound tissue dry wound without warmth, erythema, signs of acute infection and appears improved compared to last recheck  Normal muscle strength 5/5 to all lower extremity muscle groups bilaterally, no gross bony deformities bilaterally, no pain crepitus or joint limitation noted with ROM b/l and plantarflexed 3rd metatarsal right foot  Protective sensation intact 5/5 intact bilaterally with 10g monofilament b/l Vibratory sensation intact b/l  Assessment:   1. Ulcer of right foot, limited to breakdown of skin ([place])    Plan:  -Patient was evaluated and treated and all  questions answered. -Lesion debrided and cleansed with alcohol. Partial thickness ulcer is completely healed. -Patient to report any pedal injuries to medical professional immediately. -We did discuss shoe gear change to shoes with shock absorption properties to reduce pressure to callus. Recommended Oofos recovery shoes with memory foam insoles.  -Will schedule 3 week follow up for her toenail debridement as well as plantar lesion right foot. Sarah Mata related understanding of plan.  -Patient/POA to call should there be question/concern in the interim.  Return in about 3 weeks (around 02/19/2020).

## 2020-02-05 ENCOUNTER — Telehealth: Payer: Self-pay | Admitting: Neurology

## 2020-02-05 NOTE — Telephone Encounter (Signed)
Patient states that she tried to walk home the other day and her Gait is really off she states she has [place] application for transportation and thinks she will have to fill out a form soon caller wants to know if we have the forms or where she could get the forms

## 2020-02-05 NOTE — Telephone Encounter (Signed)
She will need to talk with PCP about [employer] forms if that is what she is talking about.  She doesn't come see me often, last time made it a telemed visit so didn't even come into the office and declines to take any medication for the Parkinsons Disease

## 2020-02-05 NOTE — Telephone Encounter (Signed)
Left detailed message on patients voicemail advising her to contact her pcp office and they should be able to help her. Our office does not complete [employer] bus forms.

## 2020-02-17 ENCOUNTER — Ambulatory Visit (INDEPENDENT_AMBULATORY_CARE_PROVIDER_SITE_OTHER): Payer: Medicare Other | Admitting: Podiatry

## 2020-02-17 ENCOUNTER — Other Ambulatory Visit: Payer: Self-pay

## 2020-02-17 DIAGNOSIS — L97511 Non-pressure chronic ulcer of other part of right foot limited to breakdown of skin: Secondary | ICD-10-CM

## 2020-02-17 NOTE — Patient Instructions (Signed)
Corns and Calluses Corns are small areas of thickened skin that occur on the top, sides, or tip of a toe. They contain a cone-shaped core with a point that can press on a nerve below. This causes pain.  Calluses are areas of thickened skin that can occur anywhere on the body, including the hands, fingers, palms, soles of the feet, and heels. Calluses are usually larger than corns. What are the causes? Corns and calluses are caused by rubbing (friction) or pressure, such as from shoes that are too tight or do not fit properly. What increases the risk? Corns are more likely to develop in people who have misshapen toes (toe deformities), such as hammer toes. Calluses can occur with friction to any area of the skin. They are more likely to develop in people who:  Work with their hands.  Wear shoes that fit poorly, are too tight, or are high-heeled.  Have toe deformities. What are the signs or symptoms? Symptoms of a corn or callus include:  A hard growth on the skin.  Pain or tenderness under the skin.  Redness and swelling.  Increased discomfort while wearing tight-fitting shoes, if your feet are affected. If a corn or callus becomes infected, symptoms may include:  Redness and swelling that gets worse.  Pain.  Fluid, blood, or pus draining from the corn or callus. How is this diagnosed? Corns and calluses may be diagnosed based on your symptoms, your medical history, and a physical exam. How is this treated? Treatment for corns and calluses may include:  Removing the cause of the friction or pressure. This may involve: ? Changing your shoes. ? Wearing shoe inserts (orthotics) or other protective layers in your shoes, such as a corn pad. ? Wearing gloves.  Applying medicine to the skin (topical medicine) to help soften skin in the hardened, thickened areas.  Removing layers of dead skin with a file to reduce the size of the corn or callus.  Removing the corn or callus with a  scalpel or laser.  Taking antibiotic medicines, if your corn or callus is infected.  Having surgery, if a toe deformity is the cause. Follow these instructions at home:   Take over-the-counter and prescription medicines only as told by your health care provider.  If you were prescribed an antibiotic, take it as told by your health care provider. Do not stop taking it even if your condition starts to improve.  Wear shoes that fit well. Avoid wearing high-heeled shoes and shoes that are too tight or too loose.  Wear any padding, protective layers, gloves, or orthotics as told by your health care provider.  Soak your hands or feet and then use a file or pumice stone to soften your corn or callus. Do this as told by your health care provider.  Check your corn or callus every day for symptoms of infection. Contact a health care provider if you:  Notice that your symptoms do not improve with treatment.  Have redness or swelling that gets worse.  Notice that your corn or callus becomes painful.  Have fluid, blood, or pus coming from your corn or callus.  Have new symptoms. Summary  Corns are small areas of thickened skin that occur on the top, sides, or tip of a toe.  Calluses are areas of thickened skin that can occur anywhere on the body, including the hands, fingers, palms, and soles of the feet. Calluses are usually larger than corns.  Corns and calluses are caused by   rubbing (friction) or pressure, such as from shoes that are too tight or do not fit properly.  Treatment may include wearing any padding, protective layers, gloves, or orthotics as told by your health care provider. This information is not intended to replace advice given to you by your health care provider. Make sure you discuss any questions you have with your health care provider. Document Revised: 02/25/2019 Document Reviewed: 09/18/2017 Elsevier Patient Education  2020 [employer].  Onychomycosis/Fungal  Toenails  WHAT IS IT? An infection that lies within the keratin of your nail plate that is caused by a fungus.  WHY ME? Fungal infections affect all ages, sexes, races, and creeds.  There may be many factors that predispose you to a fungal infection such as age, coexisting medical conditions such as diabetes, or an autoimmune disease; stress, medications, fatigue, genetics, etc.  Bottom line: fungus thrives in a warm, moist environment and your shoes offer such a location.  IS IT CONTAGIOUS? Theoretically, yes.  You do not want to share shoes, nail clippers or files with someone who has fungal toenails.  Walking around barefoot in the same room or sleeping in the same bed is unlikely to transfer the organism.  It is important to realize, however, that fungus can spread easily from one nail to the next on the same foot.  HOW DO WE TREAT THIS?  There are several ways to treat this condition.  Treatment may depend on many factors such as age, medications, pregnancy, liver and kidney conditions, etc.  It is best to ask your doctor which options are available to you.  4. No treatment.   Unlike many other medical concerns, you can live with this condition.  However for many people this can be a painful condition and may lead to ingrown toenails or a bacterial infection.  It is recommended that you keep the nails cut short to help reduce the amount of fungal nail. 5. Topical treatment.  These range from herbal remedies to prescription strength nail lacquers.  About 40-50% effective, topicals require twice daily application for approximately 9 to 12 months or until an entirely new nail has grown out.  The most effective topicals are medical grade medications available through physicians offices. 6. Oral antifungal medications.  With an 80-90% cure rate, the most common oral medication requires 3 to 4 months of therapy and stays in your system for a year as the new nail grows out.  Oral antifungal medications do  require blood work to make sure it is a safe drug for you.  A liver function panel will be performed prior to starting the medication and after the first month of treatment.  It is important to have the blood work performed to avoid any harmful side effects.  In general, this medication safe but blood work is required. 7. Laser Therapy.  This treatment is performed by applying a specialized laser to the affected nail plate.  This therapy is noninvasive, fast, and non-painful.  It is not covered by insurance and is therefore, out of pocket.  The results have been very good with a 80-95% cure rate.  [facility] is the only practice in the area to offer this therapy. 8. Permanent Nail Avulsion.  Removing the entire nail so that a new nail will not grow back. 

## 2020-02-18 ENCOUNTER — Encounter: Payer: Self-pay | Admitting: Podiatry

## 2020-02-18 NOTE — Progress Notes (Signed)
Subjective: Sarah Mata presents today for follow up of ulceration to the submet head 3 right foot. She states her foot feels much better. She did purchase Oofos shoes, but states they make her feel unstable with her Parkinson's. She feels her right foot needs more arch support.  Allergies  Allergen Reactions  . Tetracyclines & Related Other (See Comments)    Stomach burning    Objective: There were no vitals filed for this visit.  Pt 79 y.o. year old Caucasian female  in NAD. AAO x 3.   Vascular Examination:  Capillary refill time to digits immediate b/l. Palpable DP pulses b/l. Palpable PT pulses b/l. Pedal hair present b/l. Skin temperature gradient within normal limits b/l.  Dermatological Examination: Pedal skin with normal turgor, texture and tone bilaterally. No open wounds bilaterally. No interdigital macerations bilaterally. Toenails 1-5 b/l adequate length.   Minimal hyperkeratosis submet head 3 right foot. No erythema, no edema, no drainage, no flocculence.  Musculoskeletal: Normal muscle strength 5/5 to all lower extremity muscle groups bilaterally, no pain crepitus or joint limitation noted with ROM b/l and plantarflexed 3rd metatarsal. Completely resolved lesion.  Neurological: Protective sensation intact 5/5 intact bilaterally with 10g monofilament b/l Vibratory sensation intact b/l Proprioception intact bilaterally.  Assessment: 1. Ulcer of right foot, limited to breakdown of skin ([place])   Ulcer is healed.   Plan: -Patient to continue soft, supportive shoe gear daily. -Patient to report any pedal injuries to medical professional immediately. -Her lesion is healed. I advised her it is healed. -We did discuss her Oofos shoe gear. She is still wearing them in the home to honor her warranty in case she wants to return them. If she decides to return her shoes, I recommended [employer] Sneakers. She related understanding. -Patient/POA to call should there be  question/concern in the interim.  Return in about 9 weeks (around 04/20/2020) for nail and callus trim.

## 2020-02-23 ENCOUNTER — Telehealth: Payer: Self-pay | Admitting: Family Medicine

## 2020-02-23 NOTE — Telephone Encounter (Signed)
Left message for patient to call back and schedule Medicare Annual Wellness Visit (AWV) either virtually/audio only OR in office. Whatever the patients preference is.  No hx per [place]; please schedule at anytime with [facility]Nurse Health Advisor at [facility].   

## 2020-04-05 ENCOUNTER — Telehealth: Payer: Self-pay | Admitting: Family Medicine

## 2020-04-05 NOTE — Telephone Encounter (Signed)
Patient would like to see if she could start taking b12 shots.

## 2020-04-05 NOTE — Telephone Encounter (Signed)
She been having low energy and thinks it will help with that.

## 2020-04-06 NOTE — Telephone Encounter (Signed)
Pt returned call stating she does not want to make an appointment and that it is not urgent.

## 2020-04-06 NOTE — Telephone Encounter (Signed)
LVM asking patient if should like to schedule a follow up with Dr.Wolfe.

## 2020-04-06 NOTE — Telephone Encounter (Signed)
I only give b12 shots if she is truly deficient in B12. We can have her come do a lab draw and if low, we can start injections, otherwise if her levels are normal there is no need for b12 injections. She can make appointment for fatigue and we can check everything..  Dr. Rogers Blocker

## 2020-04-06 NOTE — Telephone Encounter (Signed)
Lvm for pt to call the office back. 

## 2020-04-06 NOTE — Telephone Encounter (Signed)
Called pt to give message below. She states wanting to take the pill form, I encouraged her to make an appointment to be seen, so that labs can be done.  Please make appointment for pt.

## 2020-04-20 ENCOUNTER — Ambulatory Visit (INDEPENDENT_AMBULATORY_CARE_PROVIDER_SITE_OTHER): Payer: Medicare Other | Admitting: Podiatry

## 2020-04-20 ENCOUNTER — Other Ambulatory Visit: Payer: Self-pay

## 2020-04-20 ENCOUNTER — Encounter: Payer: Self-pay | Admitting: Podiatry

## 2020-04-20 DIAGNOSIS — B351 Tinea unguium: Secondary | ICD-10-CM | POA: Diagnosis not present

## 2020-04-20 DIAGNOSIS — M79676 Pain in unspecified toe(s): Secondary | ICD-10-CM

## 2020-04-20 DIAGNOSIS — M79671 Pain in right foot: Secondary | ICD-10-CM

## 2020-04-20 DIAGNOSIS — L84 Corns and callosities: Secondary | ICD-10-CM

## 2020-04-20 NOTE — Patient Instructions (Signed)

## 2020-04-24 NOTE — Progress Notes (Signed)
Subjective: Sarah Mata is a pleasant 79 y.o. female patient seen today painful plantar lesions right foot.  Pain prevent comfortable ambulation. Aggravating factor is weightbearing with or without shoegear.  Patient Active Problem List   Diagnosis Date Noted  . Hyperlipidemia 12/28/2019  . Parkinson's disease ([place]) 12/23/2019  . 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 Eustachian tube dysfunction 09/16/2017  . Atherosclerosis of aorta ([place]) 09/16/2017  . Systemic lupus erythematosus ([place]) 09/16/2017  . Chronic diarrhea 06/15/2017  . Vitamin D deficiency 05/21/2017  . 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 alendronate (FOSAMAX) 70 MG tablet Take 1 tablet (70 mg total) by mouth once a week. Take with a full glass of water on an empty stomach. 4 tablet 5  . anastrozole (ARIMIDEX) 1 MG tablet TAKE 1 TABLET DAILY 90 tablet 3  . aspirin EC 81 MG tablet Take 81 mg by mouth daily.    Marland Kitchen Bioflavonoid Products (ESTER-C) TABS Take 1 tablet by mouth daily. Taking AM and PM    . CALCIUM & MAGNESIUM CARBONATES PO Take 3 tablets by mouth daily.    . Cholecalciferol (VITAMIN D3) 2000 units capsule Take 5,000 Units by mouth daily.     . Coenzyme Q10 (CO Q-10) 100 MG CAPS Take by mouth. Two caps daily    . diclofenac Sodium (VOLTAREN) 1 % GEL Apply 2 g topically 4 (four) times daily. 300 g 1  . ketorolac (ACULAR) 0.5 % ophthalmic solution     . Magnesium 125 MG CAPS     . Omega-3 Fatty Acids (EQL OMEGA 3 FISH OIL) 1200 MG CAPS Take 1 capsule by mouth daily.    Marland Kitchen omeprazole (PRILOSEC) 20 MG capsule omeprazole 20 mg capsule,delayed release    . prednisoLONE acetate (PRED FORTE) 1 % ophthalmic suspension INSTILL 1 DROP INTO LEFT EYE 4 TIMES A DAY    . TURMERIC CURCUMIN PO Take by mouth.     No current facility-administered medications on file prior to visit.    Allergies  Allergen  Reactions  . Tetracyclines & Related Other (See Comments)    Stomach burning    Objective: Physical Exam  General: Sarah Mata is a pleasant 79 y.o.  Caucsian female, in NAD. AAO x 3.   Vascular:  Neurovascular status unchanged b/l lower extremities. Capillary refill time to digits immediate b/l. Palpable DP pulses b/l. Palpable PT pulses b/l. Pedal hair present b/l. Skin temperature gradient within normal limits b/l.  Dermatological:  Pedal skin with normal turgor, texture and tone bilaterally. No open wounds bilaterally. No interdigital macerations bilaterally. Porokeratotic lesion(s) submet head 3 right foot with subdermal hemorrhage. No erythema, no edema, no drainage, no flocculence.  Toenails 1-5 b/l are painful, elongated, discolored, dystrophic with subungual debris. Pain with dorsal palpation of nailplates. No erythema, no edema, no drainage noted.  Musculoskeletal:  Normal muscle strength 5/5 to all lower extremity muscle groups bilaterally. Plantarflexed 3rd ray right foot. No pain crepitus or joint limitation noted with ROM b/l.  Neurological:  Protective sensation intact 5/5 intact bilaterally with 10g monofilament b/l. Vibratory sensation intact b/l. Proprioception intact bilaterally.  Assessment and Plan:  1. Pain due to onychomycosis of toenail   2. Pre-ulcerative calluses   3. Right foot pain    -Examined patient. -No new findings. No new orders. -Toenails 1-5 b/l were debrided in length and girth with sterile nail nippers and dremel  without iatrogenic bleeding.  -Patient to continue soft, supportive shoe gear daily. -Patient to report any pedal injuries to medical professional immediately. -Preulcerative callus right foot pared with sterile scalpel blade without incident. -Patient/POA to call should there be question/concern in the interim.  Return in about 9 weeks (around 06/22/2020) for nail and callus trim.  Marzetta Board, DPM

## 2020-06-22 ENCOUNTER — Ambulatory Visit (INDEPENDENT_AMBULATORY_CARE_PROVIDER_SITE_OTHER): Payer: Medicare Other | Admitting: Family Medicine

## 2020-06-22 ENCOUNTER — Encounter: Payer: Self-pay | Admitting: Family Medicine

## 2020-06-22 ENCOUNTER — Other Ambulatory Visit: Payer: Self-pay

## 2020-06-22 ENCOUNTER — Telehealth: Payer: Self-pay | Admitting: Hematology

## 2020-06-22 ENCOUNTER — Ambulatory Visit: Payer: Medicare Other

## 2020-06-22 VITALS — BP 180/80 | HR 69 | Temp 98.1°F | Ht 63.0 in | Wt 193.2 lb

## 2020-06-22 DIAGNOSIS — E782 Mixed hyperlipidemia: Secondary | ICD-10-CM

## 2020-06-22 DIAGNOSIS — D1722 Benign lipomatous neoplasm of skin and subcutaneous tissue of left arm: Secondary | ICD-10-CM

## 2020-06-22 DIAGNOSIS — I10 Essential (primary) hypertension: Secondary | ICD-10-CM | POA: Diagnosis not present

## 2020-06-22 MED ORDER — AMLODIPINE BESYLATE 5 MG PO TABS: 5.0000 mg | ORAL_TABLET | Freq: Every day | ORAL | 1 refills | Status: DC

## 2020-06-22 NOTE — Progress Notes (Signed)
Patient: Sarah Mata MRN: [id number] DOB: [birth year] PCP: Orma Flaming, MD     Subjective:  Chief Complaint  Patient presents with  . Hypertension  . Hyperlipidemia    HPI: The patient is a 79 y.o. female who presents today for Hypertension and Hyperlipidemia. She did not her B/P log today. She was given a B/P log today for further home readings. She has lost 10 pounds and is exercising.    I saw her 6 months ago and she was new to me. Her blood pressure was elevated and I asked her to keep a log for me and return in 3 months. She has done neither. She comes today and does not have her log. She tells me she is not great with doctors.   Hyperlipidemia She has never been on medication. She has lost 10 pounds and would not like to be put on medication.   The 10-year ASCVD risk score Mikey Bussing DC Jr., et al., 2013) is: 41.1%   Lump on left arm: would like to know what this is.doesn't hurt and is not growing. [place].   Review of Systems  Constitutional: Negative for chills, fatigue and fever.  HENT: Negative for dental problem, ear pain, hearing loss and trouble swallowing.   Eyes: Negative for visual disturbance.  Respiratory: Negative for cough, chest tightness and shortness of breath.   Cardiovascular: Negative for chest pain, palpitations and leg swelling.  Gastrointestinal: Negative for abdominal pain, blood in stool, diarrhea and nausea.  Endocrine: Negative for cold intolerance, polydipsia, polyphagia and polyuria.  Genitourinary: Negative for dysuria and hematuria.  Musculoskeletal: Negative for arthralgias.  Skin: Negative for rash.  Neurological: Negative for dizziness and headaches.  Psychiatric/Behavioral: Negative for dysphoric mood and sleep disturbance. The patient is not nervous/anxious.     Allergies Patient is allergic to tetracyclines & related.  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  Surgical History Patient  has a past surgical history that includes Foot surgery (Right, 2010); Broken Nose; Breast biopsy; Reduction mammaplasty (Bilateral, 2015); and Breast lumpectomy (Right, 2015).  Family History Pateint's family history includes Breast cancer in her mother and sister; Cancer in her mother; Diabetes in her sister; Heart disease in her father; Hypertension in her mother and sister.  Social History Patient  reports that she has never smoked. She has never used smokeless tobacco. She reports current alcohol use. She reports that she does not use drugs.    Objective: Vitals:   06/22/20 1328 06/22/20 1408  BP: (!) 150/80 (!) 180/80  Pulse: 69   Temp: 98.1 F (36.7 C)   TempSrc: Temporal   SpO2: 95%   Weight: 193 lb 3.2 oz (87.6 kg)   Height: 5\' 3"  (1.6 m)     Body mass index is 34.22 kg/m.  Physical Exam Vitals reviewed.  Constitutional:      Appearance: Normal appearance. She is normal weight.  HENT:     Head: Normocephalic and atraumatic.  Neck:     Vascular: No carotid bruit.  Cardiovascular:     Rate and Rhythm: Normal rate and regular rhythm.     Heart sounds: Normal heart sounds.  Pulmonary:     Effort: Pulmonary effort is normal.     Breath sounds: Normal breath sounds.  Abdominal:     General: Abdomen is flat. Bowel sounds are normal.  Palpations: Abdomen is soft.  Musculoskeletal:     Cervical back: Normal range of motion and neck supple.     Comments: Left antecubital fossa Mobile mass about 3cmx2cm.   Skin:    General: Skin is warm.     Capillary Refill: Capillary refill takes less than 2 seconds.  Neurological:     General: No focal deficit present.     Mental Status: She is alert and oriented to person, place, and time.     Comments: Resting tremor   Psychiatric:        Mood and Affect: Mood  normal.        Behavior: Behavior normal.        Assessment/plan: 1. Essential hypertension Quite elevated in multiple settings. Didn't keep a log for me. discussed that since it's been high over the course of a few office visits with different providers, we are going to start medication. Discussed options and she was most comfortable with amlodipine. Will start her off at 5mg . Encourage her to keep a log for me. Side effects discussed. Close f/u in 1 month.   2. Mixed hyperlipidemia Discussed 10 year risk. Does have strong family history on her father's side. She will think about this.   3. Lipoma of left upper extremity Reassurance given. Discussed if growing, causing her pain we can send to surgery for removal, but if not bothering her we can continue to monitor.     This visit occurred during the SARS-CoV-2 public health emergency.  Safety protocols were in place, including screening questions prior to the visit, additional usage of staff PPE, and extensive cleaning of exam room while observing appropriate contact time as indicated for disinfecting solutions.    Return in about 1 month (around 07/23/2020) for blood pressure .   Orma Flaming, MD [place]   06/22/2020

## 2020-06-22 NOTE — Telephone Encounter (Signed)
rescheduled appts on 9/8. YF in clinic that afternoon. Moved pt to see LB. Pt is aware.

## 2020-06-22 NOTE — Patient Instructions (Signed)
We are going to start you on a drug called amlodipine (norvasc). It is 5mg  and you will take this once/day.   Please get blood pressure cuff and keep a log of home readings. Will see you back in one month.   -your cholesterol is only mildly elevated, but 10 year risk is 31.9%. I usually start medication at 7.5%. you can look up this drug called crestor, it is a statin.   -any headaches or increased leg swelling please let me know!   See you in one month,  Dr. Rogers Blocker

## 2020-06-29 ENCOUNTER — Encounter: Payer: Self-pay | Admitting: Podiatry

## 2020-06-29 ENCOUNTER — Ambulatory Visit (INDEPENDENT_AMBULATORY_CARE_PROVIDER_SITE_OTHER): Payer: Medicare Other | Admitting: Podiatry

## 2020-06-29 DIAGNOSIS — M79676 Pain in unspecified toe(s): Secondary | ICD-10-CM | POA: Diagnosis not present

## 2020-06-29 DIAGNOSIS — Q828 Other specified congenital malformations of skin: Secondary | ICD-10-CM

## 2020-06-29 DIAGNOSIS — B351 Tinea unguium: Secondary | ICD-10-CM | POA: Diagnosis not present

## 2020-06-29 DIAGNOSIS — M79671 Pain in right foot: Secondary | ICD-10-CM

## 2020-06-30 NOTE — Progress Notes (Signed)
Subjective: Sarah Mata is a pleasant 79 y.o. female patient seen today painful thick toenails that are difficult to trim. Pain interferes with ambulation. Aggravating factors include wearing enclosed shoe gear. Pain is relieved with periodic professional debridement.  Patient also presents with painful porokeratotic lesion right foot.  Pain prevent comfortable ambulation. Aggravating factor is weightbearing with or without shoegear.  Patient Active Problem List   Diagnosis Date Noted  . Hyperlipidemia 12/28/2019  . Parkinson's disease ([place]) 12/23/2019  . 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 Eustachian tube dysfunction 09/16/2017  . Atherosclerosis of aorta ([place]) 09/16/2017  . Systemic lupus erythematosus ([place]) 09/16/2017  . Chronic diarrhea 06/15/2017  . Vitamin D deficiency 05/21/2017  . 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 alendronate (FOSAMAX) 70 MG tablet Take 1 tablet (70 mg total) by mouth once a week. Take with a full glass of water on an empty stomach. (Patient not taking: Reported on 06/22/2020) 4 tablet 5  . amLODipine (NORVASC) 5 MG tablet Take 1 tablet (5 mg total) by mouth daily. 90 tablet 1  . anastrozole (ARIMIDEX) 1 MG tablet TAKE 1 TABLET DAILY 90 tablet 3  . Ascorbic Acid (VITAMIN C) 1000 MG tablet Take 1,000 mg by mouth daily.    Marland Kitchen aspirin EC 81 MG tablet Take 81 mg by mouth daily. (Patient not taking: Reported on 06/22/2020)    . Bioflavonoid Products (ESTER-C) TABS Take 1 tablet by mouth daily. Taking AM and PM (Patient not taking: Reported on 06/22/2020)    . CALCIUM & MAGNESIUM CARBONATES PO Take 3 tablets by mouth daily. (Patient not taking: Reported on 06/22/2020)    . Cholecalciferol (VITAMIN D3) 2000 units capsule Take 5,000 Units by mouth daily.     . Coenzyme Q10 (CO Q-10) 100 MG CAPS Take by mouth. Two caps daily    . diclofenac Sodium  (VOLTAREN) 1 % GEL Apply 2 g topically 4 (four) times daily. (Patient not taking: Reported on 06/22/2020) 300 g 1  . ketorolac (ACULAR) 0.5 % ophthalmic solution  (Patient not taking: Reported on 06/22/2020)    . Magnesium 125 MG CAPS  (Patient not taking: Reported on 06/22/2020)    . Omega-3 Fatty Acids (EQL OMEGA 3 FISH OIL) 1200 MG CAPS Take 1 capsule by mouth daily. (Patient not taking: Reported on 06/22/2020)    . omeprazole (PRILOSEC) 20 MG capsule omeprazole 20 mg capsule,delayed release (Patient not taking: Reported on 06/22/2020)    . prednisoLONE acetate (PRED FORTE) 1 % ophthalmic suspension INSTILL 1 DROP INTO LEFT EYE 4 TIMES A DAY (Patient not taking: Reported on 06/22/2020)    . TURMERIC CURCUMIN PO Take by mouth. (Patient not taking: Reported on 06/22/2020)    . UNABLE TO FIND 1 MD Bone builder; 1 in Am, and 1 in PM     No current facility-administered medications on file prior to visit.    Allergies  Allergen Reactions  . Tetracyclines & Related Other (See Comments)    Stomach burning    Objective: Physical Exam  General: Gladine Plude is a pleasant 79 y.o.  Caucsian female, in NAD. AAO x 3.   Vascular:  Neurovascular status unchanged b/l lower extremities. Capillary refill time to digits immediate b/l. Palpable DP pulses b/l. Palpable PT pulses b/l. Pedal hair present b/l. Skin temperature gradient within normal limits b/l.  Dermatological:  Pedal skin with normal turgor, texture and  tone bilaterally. No open wounds bilaterally. No interdigital macerations bilaterally. Porokeratotic lesion(s) submet head 3 right foot with subdermal hemorrhage. No erythema, no edema, no drainage, no flocculence.  Toenails 1-5 b/l are painful, elongated, discolored, dystrophic with subungual debris. Pain with dorsal palpation of nailplates. No erythema, no edema, no drainage noted.  Musculoskeletal:  Normal muscle strength 5/5 to all lower extremity muscle groups bilaterally. Plantarflexed 3rd ray  right foot. No pain crepitus or joint limitation noted with ROM b/l.  Neurological:  Protective sensation intact 5/5 intact bilaterally with 10g monofilament b/l. Vibratory sensation intact b/l. Proprioception intact bilaterally.  Assessment and Plan:  1. Pain due to onychomycosis of toenail   2. Porokeratosis   3. Right foot pain    -Examined patient. -No new findings. No new orders. -Toenails 1-5 b/l were debrided in length and girth with sterile nail nippers and dremel without iatrogenic bleeding.  -Painful preulcerative porokeratotic lesion(s) submet head 3 right foot pared and enucleated with sterile scalpel blade without incident. Recommended use of Dr. Marlaine Hind reusable horseshoe pad. -Patient to report any pedal injuries to medical professional immediately. -Patient to continue soft, supportive shoe gear daily. -Patient/POA to call should there be question/concern in the interim.  Return in about 9 weeks (around 08/31/2020) for nail and callus trim.  Marzetta Board, DPM

## 2020-07-04 ENCOUNTER — Encounter: Payer: Self-pay | Admitting: Family Medicine

## 2020-07-11 ENCOUNTER — Encounter: Payer: Self-pay | Admitting: Family Medicine

## 2020-07-11 ENCOUNTER — Other Ambulatory Visit: Payer: Self-pay

## 2020-07-11 ENCOUNTER — Telehealth: Payer: Self-pay | Admitting: Family Medicine

## 2020-07-11 ENCOUNTER — Ambulatory Visit (INDEPENDENT_AMBULATORY_CARE_PROVIDER_SITE_OTHER): Payer: Medicare Other | Admitting: Family Medicine

## 2020-07-11 VITALS — BP 156/92 | HR 81 | Temp 98.4°F | Ht 63.0 in | Wt 191.2 lb

## 2020-07-11 DIAGNOSIS — J3489 Other specified disorders of nose and nasal sinuses: Secondary | ICD-10-CM

## 2020-07-11 DIAGNOSIS — R6889 Other general symptoms and signs: Secondary | ICD-10-CM | POA: Diagnosis not present

## 2020-07-11 DIAGNOSIS — R220 Localized swelling, mass and lump, head: Secondary | ICD-10-CM

## 2020-07-11 DIAGNOSIS — I10 Essential (primary) hypertension: Secondary | ICD-10-CM

## 2020-07-11 DIAGNOSIS — F22 Delusional disorders: Secondary | ICD-10-CM

## 2020-07-11 DIAGNOSIS — R8290 Unspecified abnormal findings in urine: Secondary | ICD-10-CM

## 2020-07-11 DIAGNOSIS — L750 Bromhidrosis: Secondary | ICD-10-CM

## 2020-07-11 LAB — POCT URINALYSIS DIP (MANUAL ENTRY)
Bilirubin, UA: NEGATIVE
Blood, UA: NEGATIVE
Glucose, UA: NEGATIVE mg/dL
Ketones, POC UA: NEGATIVE mg/dL
Leukocytes, UA: NEGATIVE
Nitrite, UA: NEGATIVE
Protein Ur, POC: NEGATIVE mg/dL
Spec Grav, UA: 1.01 (ref 1.010–1.025)
Urobilinogen, UA: 0.2 E.U./dL
pH, UA: 6 (ref 5.0–8.0)

## 2020-07-11 MED ORDER — MUPIROCIN 2 % EX OINT: 1.0000 "application " | TOPICAL_OINTMENT | Freq: Three times a day (TID) | CUTANEOUS | 0 refills | Status: DC

## 2020-07-11 MED ORDER — HYDROCHLOROTHIAZIDE 12.5 MG PO TABS: 12.5000 mg | ORAL_TABLET | Freq: Every day | ORAL | 0 refills | Status: DC

## 2020-07-11 NOTE — Patient Instructions (Signed)
-  checking for infection in your urine and blood -ointment for nose infection -stop the amlodipine, I sent in new blood pressure pill called hydrochlorothiazide. Will take one pill/day.  -for constipation, try citrucel, but if not better we need to send to GI.   See you in one month.   Fever/chills, worsening back pain: Go to ER.

## 2020-07-11 NOTE — Progress Notes (Signed)
Patient: Sarah Mata MRN: [id number] DOB: [birth year] PCP: Orma Flaming, MD     Subjective:  Chief Complaint  Patient presents with  . foul urine  . Hypertension  . Constipation    HPI: The patient is a 79 y.o. female who presents today for hypertension and other complaints. She thinks she has a urine infection. She states her urine is smelly and thick and not normal. She also tells me she thinks she is septic. She has had no fever. She has no stomach pain, but has flank pain, urgency and frequency.   Hypertension: Here for follow up of hypertension.  Currently on amlodipine 5mg , but she feels like her bottom lip feels swollen even though it doesn't looks swollen. Her blood pressure was still high at home 147/83. She is having leg swelling and doesn't like the drug.   She feels like people think she smells. No one has told her anything, but she says, "I know people think they smell." I can not smell her.   Constipation -states she can't go and is very hard to explain symptoms. She states it foul smelling as well. History is very difficult on this.   Review of Systems  Constitutional: Negative for chills and fever.  Gastrointestinal: Positive for constipation. Negative for abdominal pain, diarrhea, nausea and vomiting.  Genitourinary: Positive for difficulty urinating, flank pain, frequency and urgency. Negative for dysuria, hematuria and vaginal discharge.  Skin: Negative for rash.    Allergies Patient is allergic to tetracyclines & related.  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  Surgical History Patient  has a past surgical history that includes Foot surgery (Right, 2010); Broken Nose; Breast biopsy; Reduction mammaplasty (Bilateral, 2015); and Breast lumpectomy (Right,  2015).  Family History Pateint's family history includes Breast cancer in her mother and sister; Cancer in her mother; Diabetes in her sister; Heart disease in her father; Hypertension in her mother and sister.  Social History Patient  reports that she has never smoked. She has never used smokeless tobacco. She reports current alcohol use. She reports that she does not use drugs.    Objective: Vitals:   07/11/20 1306  BP: (!) 156/92  Pulse: 81  Temp: 98.4 F (36.9 C)  TempSrc: Temporal  SpO2: 95%  Weight: 191 lb 3.2 oz (86.7 kg)  Height: 5\' 3"  (1.6 m)    Body mass index is 33.87 kg/m.  Physical Exam Vitals reviewed.  Constitutional:      Appearance: Normal appearance. She is obese.     Comments: No obvious odor  HENT:     Head: Normocephalic and atraumatic.  Cardiovascular:     Rate and Rhythm: Normal rate.     Heart sounds: Normal heart sounds.  Pulmonary:     Effort: Pulmonary effort is normal.     Breath sounds: Normal breath sounds.  Abdominal:     General: Bowel sounds are normal.     Palpations: Abdomen is soft.     Tenderness: There is no abdominal tenderness. There is right CVA tenderness. There is no left CVA tenderness.  Musculoskeletal:     Cervical back: Normal range of motion and neck supple.  Skin:    Findings: No rash.  Neurological:     General: No focal deficit present.     Mental Status: She is alert.  Psychiatric:  Comments: Paranoid and thoughts not making sense.    UA normal      Assessment/plan: 1. Benign essential HTN Above goal. We will stop this due to side effects and trial hctz. Discussed we will do 12.5mg  and see her back in one month for close follow up.   2. Swollen lip Not appreciated on exam, but stopping norvasc which she believes is the culprit.   3. Abnormal urine odor UA completely normal. Will await culture.  - POCT urinalysis dipstick - Urine Culture; Future - CBC with Differential/Platelet; Future  4.  Abnormal body odor No smell appreciated today. She is very paranoid about this. She also thinks she is septic with no findings consistent with this on exam. Will check labs, urine culture and go from there.  - Comprehensive metabolic panel; Future - Sedimentation rate; Future - C-reactive protein; Future  5. Paranoia -unsure if due to parkinsons, which is mostly likely etiology. Will attach Dr. Carles Collet as she has f/u with her soon. I also think her constipation may be due to this as well.   6. Nasal sore Very mild, bactroban TID.      This visit occurred during the SARS-CoV-2 public health emergency.  Safety protocols were in place, including screening questions prior to the visit, additional usage of staff PPE, and extensive cleaning of exam room while observing appropriate contact time as indicated for disinfecting solutions.     Return in about 1 month (around 08/11/2020) for blood pressure .    Orma Flaming, MD [place]   07/11/2020

## 2020-07-11 NOTE — Telephone Encounter (Signed)
Hey melitta- Can you find out if there is a DPR for her sister in [place]?   Thanks!  aw

## 2020-07-12 ENCOUNTER — Encounter: Payer: Self-pay | Admitting: Family Medicine

## 2020-07-12 ENCOUNTER — Other Ambulatory Visit: Payer: Self-pay | Admitting: Family Medicine

## 2020-07-12 ENCOUNTER — Other Ambulatory Visit: Payer: Medicare Other

## 2020-07-12 DIAGNOSIS — E871 Hypo-osmolality and hyponatremia: Secondary | ICD-10-CM

## 2020-07-12 DIAGNOSIS — M7989 Other specified soft tissue disorders: Secondary | ICD-10-CM

## 2020-07-12 LAB — URINE CULTURE
MICRO NUMBER:: 10859315
Result:: NO GROWTH
SPECIMEN QUALITY:: ADEQUATE

## 2020-07-12 LAB — COMPREHENSIVE METABOLIC PANEL
AG Ratio: 1.4 (calc) (ref 1.0–2.5)
ALT: 13 U/L (ref 6–29)
AST: 21 U/L (ref 10–35)
Albumin: 4.3 g/dL (ref 3.6–5.1)
Alkaline phosphatase (APISO): 125 U/L (ref 37–153)
BUN: 10 mg/dL (ref 7–25)
CO2: 24 mmol/L (ref 20–32)
Calcium: 9.7 mg/dL (ref 8.6–10.4)
Chloride: 93 mmol/L — ABNORMAL LOW (ref 98–110)
Creat: 0.85 mg/dL (ref 0.60–0.93)
Globulin: 3.1 g/dL (calc) (ref 1.9–3.7)
Glucose, Bld: 91 mg/dL (ref 65–99)
Potassium: 4.3 mmol/L (ref 3.5–5.3)
Sodium: 128 mmol/L — ABNORMAL LOW (ref 135–146)
Total Bilirubin: 0.8 mg/dL (ref 0.2–1.2)
Total Protein: 7.4 g/dL (ref 6.1–8.1)

## 2020-07-12 LAB — CBC WITH DIFFERENTIAL/PLATELET
Absolute Monocytes: 608 cells/uL (ref 200–950)
Basophils Absolute: 71 cells/uL (ref 0–200)
Basophils Relative: 1.2 %
Eosinophils Absolute: 89 cells/uL (ref 15–500)
Eosinophils Relative: 1.5 %
HCT: 43 % (ref 35.0–45.0)
Hemoglobin: 14.5 g/dL (ref 11.7–15.5)
Lymphs Abs: 1280 cells/uL (ref 850–3900)
MCH: 30.5 pg (ref 27.0–33.0)
MCHC: 33.7 g/dL (ref 32.0–36.0)
MCV: 90.3 fL (ref 80.0–100.0)
MPV: 12.2 fL (ref 7.5–12.5)
Monocytes Relative: 10.3 %
Neutro Abs: 3853 cells/uL (ref 1500–7800)
Neutrophils Relative %: 65.3 %
Platelets: 171 10*3/uL (ref 140–400)
RBC: 4.76 10*6/uL (ref 3.80–5.10)
RDW: 12.8 % (ref 11.0–15.0)
Total Lymphocyte: 21.7 %
WBC: 5.9 10*3/uL (ref 3.8–10.8)

## 2020-07-12 LAB — C-REACTIVE PROTEIN: CRP: 6 mg/L (ref <8.0)

## 2020-07-12 LAB — SEDIMENTATION RATE: Sed Rate: 29 mm/h (ref 0–30)

## 2020-07-12 NOTE — Telephone Encounter (Signed)
Her sister in [place] is on her current [place]

## 2020-07-13 LAB — BASIC METABOLIC PANEL
BUN/Creatinine Ratio: 11 (calc) (ref 6–22)
BUN: 11 mg/dL (ref 7–25)
CO2: 24 mmol/L (ref 20–32)
Calcium: 9.6 mg/dL (ref 8.6–10.4)
Chloride: 95 mmol/L — ABNORMAL LOW (ref 98–110)
Creat: 0.96 mg/dL — ABNORMAL HIGH (ref 0.60–0.93)
Glucose, Bld: 104 mg/dL — ABNORMAL HIGH (ref 65–99)
Potassium: 4.3 mmol/L (ref 3.5–5.3)
Sodium: 130 mmol/L — ABNORMAL LOW (ref 135–146)

## 2020-07-14 LAB — SODIUM, URINE, RANDOM: Sodium, Ur: 61 mmol/L (ref 28–272)

## 2020-07-14 LAB — OSMOLALITY, URINE: Osmolality, Ur: 315 mOsm/kg (ref 50–1200)

## 2020-07-15 NOTE — Progress Notes (Signed)
Assessment/Plan:   1.  Parkinsons Disease  -Discussed medication again with patient.  She is willing to do that now.  We will start levodopa and work to carbidopa/levodopa 25/100 at 9am/1pm/5pm.  Titration schedule was given.  -discussed the importance of safe, CV exercise.  She is somewhat resistant but told her that it was important.  Resources provided.   -recommend PT and will refer for home PT since pt unable to drive now  -recommend walker  -discussed counseling with Sarah Mata.  She declines, but states that she wants to see social work.  Discussed that I do not have social work within my office right now, but that Sarah Mata was my former Education officer, [employer] and thought that she could offer lots of resources, including counseling.  Information given but declined for now 2.  Paranoia  -Discussed with primary care that I do not think that this is anyway related to the patient's Parkinson's disease.  3.  HTN  -BP high in office and has been per flowsheets.  PCP has been adjusting meds.    Subjective:   Sarah Mata was seen today in follow up for Parkinsons disease.  My previous records were reviewed prior to todays visit as well as outside records available to me. She states that "I am deteriorating."  Pt denies falls but states that balance is worse.  States that she has no time for exercise - "i'm too busy with my work." Noting that R hand is slow and difficulty with coordination. Balance is better when she has a shopping cart.   Pt denies lightheadedness, near syncope.  No hallucinations but has some visual distortions.  Received correspondence from patient's primary care, who was worried about paranoia and wondering if this was from Parkinson's.  C/o "fullness" in the head.  C/o a "dripping" in the back of the throat and a "terrible salty smell."    Current prescribed movement disorder medications: None, patient declines   PREVIOUS MEDICATIONS: levodopa (tried half tablet 1 time of  levodopa and decided medication was not for her)  ALLERGIES:   Allergies  Allergen Reactions  . Tetracyclines & Related Other (See Comments)    Stomach burning    CURRENT MEDICATIONS:  Outpatient Encounter Medications as of 07/19/2020  Medication Sig  . anastrozole (ARIMIDEX) 1 MG tablet TAKE 1 TABLET DAILY  . Ascorbic Acid (VITAMIN C) 1000 MG tablet Take 1,000 mg by mouth daily.  . Cholecalciferol (VITAMIN D3) 2000 units capsule Take 5,000 Units by mouth daily.   . Coenzyme Q10 (CO Q-10) 100 MG CAPS Take by mouth. Two caps daily  . hydrochlorothiazide (HYDRODIURIL) 12.5 MG tablet Take 1 tablet (12.5 mg total) by mouth daily.  . mupirocin ointment (BACTROBAN) 2 % Apply 1 application topically 3 (three) times daily.  Marland Kitchen UNABLE TO FIND 1 MD Bone builder; 1 in Am, and 1 in PM  . [DISCONTINUED] alendronate (FOSAMAX) 70 MG tablet Take 1 tablet (70 mg total) by mouth once a week. Take with a full glass of water on an empty stomach. (Patient not taking: Reported on 06/22/2020)  . [DISCONTINUED] aspirin EC 81 MG tablet Take 81 mg by mouth daily. (Patient not taking: Reported on 06/22/2020)  . [DISCONTINUED] Bioflavonoid Products (ESTER-C) TABS Take 1 tablet by mouth daily. Taking AM and PM (Patient not taking: Reported on 06/22/2020)  . [DISCONTINUED] CALCIUM & MAGNESIUM CARBONATES PO Take 3 tablets by mouth daily. (Patient not taking: Reported on 06/22/2020)  . [DISCONTINUED] diclofenac Sodium (VOLTAREN) 1 %  GEL Apply 2 g topically 4 (four) times daily. (Patient not taking: Reported on 06/22/2020)  . [DISCONTINUED] ketorolac (ACULAR) 0.5 % ophthalmic solution  (Patient not taking: Reported on 06/22/2020)  . [DISCONTINUED] Magnesium 125 MG CAPS  (Patient not taking: Reported on 06/22/2020)  . [DISCONTINUED] Omega-3 Fatty Acids (EQL OMEGA 3 FISH OIL) 1200 MG CAPS Take 1 capsule by mouth daily. (Patient not taking: Reported on 06/22/2020)  . [DISCONTINUED] omeprazole (PRILOSEC) 20 MG capsule omeprazole 20 mg  capsule,delayed release (Patient not taking: Reported on 06/22/2020)  . [DISCONTINUED] prednisoLONE acetate (PRED FORTE) 1 % ophthalmic suspension INSTILL 1 DROP INTO LEFT EYE 4 TIMES A DAY (Patient not taking: Reported on 06/22/2020)  . [DISCONTINUED] TURMERIC CURCUMIN PO Take by mouth. (Patient not taking: Reported on 06/22/2020)   No facility-administered encounter medications on file as of 07/19/2020.    Objective:   PHYSICAL EXAMINATION:    VITALS:   Vitals:   07/19/20 1456  BP: (!) 173/78  Pulse: 78  SpO2: 94%  Weight: 188 lb (85.3 kg)  Height: 5\' 3"  (1.6 m)    GEN:  The patient appears stated age and is in NAD. HEENT:  Normocephalic, atraumatic.  The mucous membranes are moist. The superficial temporal arteries are without ropiness or tenderness. CV:  RRR Lungs:  CTAB Neck/HEME:  There are no carotid bruits bilaterally.  Neurological examination:  Orientation: The patient is alert and oriented x3. Cranial nerves: There is good facial symmetry with facial hypomimia. The speech is fluent and clear. Soft palate rises symmetrically and there is no tongue deviation. Hearing is intact to conversational tone. Sensation: Sensation is intact to light touch throughout Motor: Strength is at least antigravity x4.  Movement examination: Tone: There is mild to mod increased tone in the rue Abnormal movements: there is RUE rest tremor Coordination:  There is  decremation with RAM's, with any form of RAMS, including alternating supination and pronation of the forearm, hand opening and closing, finger taps, heel taps and toe taps on the right Gait and Station: The patient has mild difficulty arising out of a deep-seated chair without the use of the hands. The patient's stride length is good but slow.  There is dec arm swing on the right.    I have reviewed and interpreted the following labs independently    Chemistry      Component Value Date/Time   NA 130 (L) 07/12/2020 1354   NA 134  (L) 06/26/2017 1313   K 4.3 07/12/2020 1354   K 4.7 06/26/2017 1313   CL 95 (L) 07/12/2020 1354   CO2 24 07/12/2020 1354   CO2 28 06/26/2017 1313   BUN 11 07/12/2020 1354   BUN 13.1 06/26/2017 1313   CREATININE 0.96 (H) 07/12/2020 1354   CREATININE 1.0 06/26/2017 1313      Component Value Date/Time   CALCIUM 9.6 07/12/2020 1354   CALCIUM 9.7 06/26/2017 1313   ALKPHOS 111 01/21/2020 1324   ALKPHOS 140 06/26/2017 1313   AST 21 07/11/2020 1347   AST 174 (HH) 06/26/2017 1313   ALT 13 07/11/2020 1347   ALT 246 (H) 06/26/2017 1313   BILITOT 0.8 07/11/2020 1347   BILITOT 0.61 06/26/2017 1313       Lab Results  Component Value Date   WBC 5.9 07/11/2020   HGB 14.5 07/11/2020   HCT 43.0 07/11/2020   MCV 90.3 07/11/2020   PLT 171 07/11/2020    Lab Results  Component Value Date   TSH 3.38 12/25/2019  Total time spent on today's visit was 45 minutes, including both face-to-face time and nonface-to-face time.  Time included that spent on review of records (prior notes available to me/labs/imaging if pertinent), discussing treatment and goals, answering patient's questions and coordinating care.  Cc:  Orma Flaming, MD

## 2020-07-19 ENCOUNTER — Other Ambulatory Visit: Payer: Self-pay

## 2020-07-19 ENCOUNTER — Encounter: Payer: Self-pay | Admitting: Neurology

## 2020-07-19 ENCOUNTER — Ambulatory Visit (INDEPENDENT_AMBULATORY_CARE_PROVIDER_SITE_OTHER): Payer: Medicare Other | Admitting: Neurology

## 2020-07-19 VITALS — BP 173/78 | HR 78 | Ht 63.0 in | Wt 188.0 lb

## 2020-07-19 DIAGNOSIS — G20 Parkinson's disease: Secondary | ICD-10-CM

## 2020-07-19 MED ORDER — CARBIDOPA-LEVODOPA 25-100 MG PO TABS
1.0000 | ORAL_TABLET | Freq: Three times a day (TID) | ORAL | 0 refills | Status: DC
Start: 2020-07-19 — End: 2020-12-28

## 2020-07-19 MED ORDER — CARBIDOPA-LEVODOPA 25-100 MG PO TABS: 1.0000 | ORAL_TABLET | Freq: Three times a day (TID) | ORAL | 0 refills | Status: DC

## 2020-07-19 MED ORDER — CARBIDOPA-LEVODOPA 25-100 MG PO TABS: 1.0000 | ORAL_TABLET | Freq: Three times a day (TID) | ORAL | 1 refills | Status: DC

## 2020-07-19 NOTE — Patient Instructions (Addendum)
1.  Start Carbidopa Levodopa as follows:  Take 1/2 tablet three times daily, at least 30 minutes before meals (approximately carbidopa/levodopa 25/100 at 9am/1pm/5pm), for one week  Then take 1/2 tablet in the morning, 1/2 tablet in the afternoon, 1 tablet in the evening, at least 30 minutes before meals, for one week  Then take 1/2 tablet in the morning, 1 tablet in the afternoon, 1 tablet in the evening, at least 30 minutes before meals, for one week  Then take 1 tablet three times daily at 9am/1pm/5pm, at least 30 minutes before meals   As a reminder, carbidopa/levodopa can be taken at the same time as a carbohydrate, but we like to have you take your pill either 30 minutes before a protein source or 1 hour after as protein can interfere with carbidopa/levodopa absorption.  2.  Think about the counseling with Myra Gianotti.  She is my old Education officer, [employer].  3. Dr Elva Mauro has placed a referral for you to have physical therapy. Someone from [facility] will be in contact with you about setting up therapy.

## 2020-07-20 ENCOUNTER — Telehealth: Payer: Self-pay | Admitting: Family Medicine

## 2020-07-20 NOTE — Telephone Encounter (Signed)
She is to put on her nose sore twice to three times a day  Orma Flaming, MD [place]

## 2020-07-20 NOTE — Telephone Encounter (Signed)
Lvm for pt call the office back to give message below.

## 2020-07-20 NOTE — Telephone Encounter (Signed)
Patient is requesting information on how and when to apply mupirocin ointment (BACTROBAN) 2 %  And is requesting a call back

## 2020-07-21 ENCOUNTER — Telehealth: Payer: Self-pay | Admitting: Neurology

## 2020-07-21 ENCOUNTER — Ambulatory Visit (INDEPENDENT_AMBULATORY_CARE_PROVIDER_SITE_OTHER): Payer: Medicare Other

## 2020-07-21 DIAGNOSIS — Z0000 Encounter for general adult medical examination without abnormal findings: Secondary | ICD-10-CM | POA: Diagnosis not present

## 2020-07-21 DIAGNOSIS — Z604 Social exclusion and rejection: Secondary | ICD-10-CM | POA: Diagnosis not present

## 2020-07-21 DIAGNOSIS — Z748 Other problems related to care provider dependency: Secondary | ICD-10-CM | POA: Diagnosis not present

## 2020-07-21 DIAGNOSIS — Z9181 History of falling: Secondary | ICD-10-CM | POA: Diagnosis not present

## 2020-07-21 NOTE — Patient Instructions (Addendum)
[name] , Thank you for taking time to come for your Medicare Wellness Visit. I appreciate your ongoing commitment to your health goals. Please review the following plan we discussed and let me know if I can assist you in the future.   Screening recommendations/referrals: Colonoscopy: Done 08/06/17 Mammogram: Done 01/14/20 Bone Density: 01/02/19 Recommended yearly ophthalmology/optometry visit for glaucoma screening and checkup Recommended yearly dental visit for hygiene and checkup  Vaccinations: Influenza vaccine: Up to date Pneumococcal vaccine: Due and Discussed Tdap vaccine: Due and Discussed Shingles vaccine: Shingrix discussed. Please contact your pharmacy for coverage information.   Covid-19:Completed 1/20 & 12/30/19  Advanced directives: Please bring a copy of your health care power of attorney and living will to the office at your convenience.   Conditions/risks identified: Lose weight  Next appointment: Follow up in one year for your annual wellness visit    Preventive Care 65 Years and Older, Female Preventive care refers to lifestyle choices and visits with your health care provider that can promote health and wellness. What does preventive care include?  A yearly physical exam. This is also called an annual well check.  Dental exams once or twice a year.  Routine eye exams. Ask your health care provider how often you should have your eyes checked.  Personal lifestyle choices, including:  Daily care of your teeth and gums.  Regular physical activity.  Eating a healthy diet.  Avoiding tobacco and drug use.  Limiting alcohol use.  Practicing safe sex.  Taking low-dose aspirin every day.  Taking vitamin and mineral supplements as recommended by your health care provider. What happens during an annual well check? The services and screenings done by your health care provider during your annual well check will depend on your age, overall health, lifestyle  risk factors, and family history of disease. Counseling  Your health care provider may ask you questions about your:  Alcohol use.  Tobacco use.  Drug use.  Emotional well-being.  Home and relationship well-being.  Sexual activity.  Eating habits.  History of falls.  Memory and ability to understand (cognition).  Work and work Statistician.  Reproductive health. Screening  You may have the following tests or measurements:  Height, weight, and BMI.  Blood pressure.  Lipid and cholesterol levels. These may be checked every 5 years, or more frequently if you are over 29 years old.  Skin check.  Lung cancer screening. You may have this screening every year starting at age 76 if you have a 30-pack-year history of smoking and currently smoke or have quit within the past 15 years.  Fecal occult blood test (FOBT) of the stool. You may have this test every year starting at age 66.  Flexible sigmoidoscopy or colonoscopy. You may have a sigmoidoscopy every 5 years or a colonoscopy every 10 years starting at age 89.  Hepatitis C blood test.  Hepatitis B blood test.  Sexually transmitted disease (STD) testing.  Diabetes screening. This is done by checking your blood sugar (glucose) after you have not eaten for a while (fasting). You may have this done every 1-3 years.  Bone density scan. This is done to screen for osteoporosis. You may have this done starting at age 21.  Mammogram. This may be done every 1-2 years. Talk to your health care provider about how often you should have regular mammograms. Talk with your health care provider about your test results, treatment options, and if necessary, the need for more tests. Vaccines  Your health  care provider may recommend certain vaccines, such as:  Influenza vaccine. This is recommended every year.  Tetanus, diphtheria, and acellular pertussis (Tdap, Td) vaccine. You may need a Td booster every 10 years.  Zoster vaccine.  You may need this after age 89.  Pneumococcal 13-valent conjugate (PCV13) vaccine. One dose is recommended after age 45.  Pneumococcal polysaccharide (PPSV23) vaccine. One dose is recommended after age 69. Talk to your health care provider about which screenings and vaccines you need and how often you need them. This information is not intended to replace advice given to you by your health care provider. Make sure you discuss any questions you have with your health care provider. Document Released: 12/02/2015 Document Revised: 07/25/2016 Document Reviewed: 09/06/2015 Elsevier Interactive Patient Education  2017 [place] Prevention in the Home Falls can cause injuries. They can happen to people of all ages. There are many things you can do to make your home safe and to help prevent falls. What can I do on the outside of my home?  Regularly fix the edges of walkways and driveways and fix any cracks.  Remove anything that might make you trip as you walk through a door, such as a raised step or threshold.  Trim any bushes or trees on the path to your home.  Use bright outdoor lighting.  Clear any walking paths of anything that might make someone trip, such as rocks or tools.  Regularly check to see if handrails are loose or broken. Make sure that both sides of any steps have handrails.  Any raised decks and porches should have guardrails on the edges.  Have any leaves, snow, or ice cleared regularly.  Use sand or salt on walking paths during winter.  Clean up any spills in your garage right away. This includes oil or grease spills. What can I do in the bathroom?  Use night lights.  Install grab bars by the toilet and in the tub and shower. Do not use towel bars as grab bars.  Use non-skid mats or decals in the tub or shower.  If you need to sit down in the shower, use a plastic, non-slip stool.  Keep the floor dry. Clean up any water that spills on the floor as soon  as it happens.  Remove soap buildup in the tub or shower regularly.  Attach bath mats securely with double-sided non-slip rug tape.  Do not have throw rugs and other things on the floor that can make you trip. What can I do in the bedroom?  Use night lights.  Make sure that you have a light by your bed that is easy to reach.  Do not use any sheets or blankets that are too big for your bed. They should not hang down onto the floor.  Have a firm chair that has side arms. You can use this for support while you get dressed.  Do not have throw rugs and other things on the floor that can make you trip. What can I do in the kitchen?  Clean up any spills right away.  Avoid walking on wet floors.  Keep items that you use a lot in easy-to-reach places.  If you need to reach something above you, use a strong step stool that has a grab bar.  Keep electrical cords out of the way.  Do not use floor polish or wax that makes floors slippery. If you must use wax, use non-skid floor wax.  Do not have  throw rugs and other things on the floor that can make you trip. What can I do with my stairs?  Do not leave any items on the stairs.  Make sure that there are handrails on both sides of the stairs and use them. Fix handrails that are broken or loose. Make sure that handrails are as long as the stairways.  Check any carpeting to make sure that it is firmly attached to the stairs. Fix any carpet that is loose or worn.  Avoid having throw rugs at the top or bottom of the stairs. If you do have throw rugs, attach them to the floor with carpet tape.  Make sure that you have a light switch at the top of the stairs and the bottom of the stairs. If you do not have them, ask someone to add them for you. What else can I do to help prevent falls?  Wear shoes that:  Do not have high heels.  Have rubber bottoms.  Are comfortable and fit you well.  Are closed at the toe. Do not wear sandals.  If  you use a stepladder:  Make sure that it is fully opened. Do not climb a closed stepladder.  Make sure that both sides of the stepladder are locked into place.  Ask someone to hold it for you, if possible.  Clearly mark and make sure that you can see:  Any grab bars or handrails.  First and last steps.  Where the edge of each step is.  Use tools that help you move around (mobility aids) if they are needed. These include:  Canes.  Walkers.  Scooters.  Crutches.  Turn on the lights when you go into a dark area. Replace any light bulbs as soon as they burn out.  Set up your furniture so you have a clear path. Avoid moving your furniture around.  If any of your floors are uneven, fix them.  If there are any pets around you, be aware of where they are.  Review your medicines with your doctor. Some medicines can make you feel dizzy. This can increase your chance of falling. Ask your doctor what other things that you can do to help prevent falls. This information is not intended to replace advice given to you by your health care provider. Make sure you discuss any questions you have with your health care provider. Document Released: 09/01/2009 Document Revised: 04/12/2016 Document Reviewed: 12/10/2014 Elsevier Interactive Patient Education  2017 [employer].

## 2020-07-21 NOTE — Progress Notes (Addendum)
Virtual Visit via Telephone Note  I connected with  Sarah Mata on 07/21/20 at  1:45 PM EDT by telephone and verified that I am speaking with the correct person using two identifiers.  Medicare Annual Wellness visit completed telephonically due to Covid-19 pandemic.   Persons participating in this call: This Health Coach and this patient.   Location: Patient: Home Provider: Office   I discussed the limitations, risks, security and privacy concerns of performing an evaluation and management service by telephone and the availability of in person appointments. The patient expressed understanding and agreed to proceed.  Unable to perform video visit due to video visit attempted and failed and/or patient does not have video capability.   Some vital signs may be absent or patient reported.   Willette Brace, LPN    Subjective:   Sarah Mata is a 79 y.o. female who presents for an Initial Medicare Annual Wellness Visit.  Review of Systems     Cardiac Risk Factors include: advanced age (>52men, >5 women);obesity (BMI >30kg/m2);hypertension;dyslipidemia     Objective:    There were no vitals filed for this visit. There is no height or weight on file to calculate BMI.  Advanced Directives 07/21/2020 07/19/2020 03/26/2018 09/27/2017 08/17/2017 08/06/2017 07/08/2017  Does Patient Have a Medical Advance Directive? Yes No No No No No No  Type of Advance Directive Living will;Healthcare Power of Attorney - - - - - -  Copy of [place] in Chart? No - copy requested - - - - - -  Would patient like information on creating a medical advance directive? - - - - No - Patient declined - -    Current Medications (verified) Outpatient Encounter Medications as of 07/21/2020  Medication Sig  . anastrozole (ARIMIDEX) 1 MG tablet TAKE 1 TABLET DAILY  . Ascorbic Acid (VITAMIN C) 1000 MG tablet Take 1,000 mg by mouth daily.  . Cholecalciferol (VITAMIN D3) 2000 units capsule Take 5,000  Units by mouth daily.   . Coenzyme Q10 (CO Q-10) 100 MG CAPS Take by mouth. Two caps daily  . hydrochlorothiazide (HYDRODIURIL) 12.5 MG tablet Take 1 tablet (12.5 mg total) by mouth daily.  Marland Kitchen UNABLE TO FIND 1 MD Bone builder; 1 in Am, and 1 in PM  . carbidopa-levodopa (SINEMET IR) 25-100 MG tablet Take 1 tablet by mouth 3 (three) times daily. (Patient not taking: Reported on 07/21/2020)  . [DISCONTINUED] carbidopa-levodopa (SINEMET IR) 25-100 MG tablet Take 1 tablet by mouth 3 (three) times daily. (Patient not taking: Reported on 07/21/2020)  . [DISCONTINUED] mupirocin ointment (BACTROBAN) 2 % Apply 1 application topically 3 (three) times daily. (Patient not taking: Reported on 07/21/2020)   No facility-administered encounter medications on file as of 07/21/2020.    Allergies (verified) Tetracyclines & related   History: Past Medical History:  Diagnosis Date  . Allergy   . Arthritis   . Breast cancer ([place])   . Heart murmur   . Lupus ([place])   . Personal history of chemotherapy   . Personal history of radiation therapy   . 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 BREAST BIOPSY    . BREAST LUMPECTOMY Right 2015  . Broken Nose     Patient fell.  Marland Kitchen FOOT SURGERY Right 2010  . REDUCTION MAMMAPLASTY Bilateral 2015   Family History  Problem Relation Age of Onset  . Cancer Mother        breast cancer   .  Breast cancer Mother   . Hypertension Mother   . Diabetes Sister   . Hypertension Sister   . Heart disease Father   . Breast cancer Sister   . Colon cancer Neg Hx   . Esophageal cancer Neg Hx   . Pancreatic cancer Neg Hx   . Rectal cancer Neg Hx   . Stomach cancer Neg Hx    Social History   Socioeconomic History  . Marital status: Widowed    Spouse name: Not on file  . Number of children: Not on file  . Years of education: Not on file  . Highest education level: Not on file  Occupational History  . 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 Smoking status: Never Smoker  . Smokeless tobacco: Never Used  Vaping Use  . Vaping Use: Never used  Substance and Sexual Activity  . Alcohol use: Yes    Comment: ONCE A MONTH  . Drug use: No  . Sexual activity: Not Currently    Comment: 1st intercourse- 24, partners- 4, widow  Other Topics Concern  . Not on file  Social History Narrative  . Not on file   Social Determinants of Health   Financial Resource Strain:   . Difficulty of Paying Living Expenses: Not on file  Food Insecurity:   . Worried About Charity fundraiser in the Last Year: Not on file  . Ran Out of Food in the Last Year: Not on file  Transportation Needs:   . Lack of Transportation (Medical): Not on file  . Lack of Transportation (Non-Medical): Not on file  Physical Activity: Inactive  . Days of Exercise per Week: 0 days  . Minutes of Exercise per Session: 0 min  Stress: No Stress Concern Present  . Feeling of Stress : Only a little  Social Connections:   . Frequency of Communication with Friends and Family: Not on file  . Frequency of Social Gatherings with Friends and Family: Not on file  . Attends Religious Services: Not on file  . Active Member of Clubs or Organizations: Not on file  . Attends Archivist Meetings: Not on file  . Marital Status: Not on file    Tobacco Counseling Counseling given: Not Answered   Clinical Intake:  Pre-visit preparation completed: Yes  Pain : No/denies pain     BMI - recorded: 33.3 Nutritional Status: BMI > 30  Obese Nutritional Risks: None Diabetes: No  How often do you need to have someone help you when you read instructions, pamphlets, or other written materials from your doctor or pharmacy?: 1 - Never  Diabetic?No  Interpreter Needed?: No  Information entered by :: Charlott Rakes, LPN   Activities of Daily Living In your present state of health, do you have any difficulty  performing the following activities: 07/21/2020 12/23/2019  Hearing? N N  Vision? N N  Difficulty concentrating or making decisions? Y N  Comment at times if too much information is given at one time -  Walking or climbing stairs? N Y  Comment - Pt says a little.  Dressing or bathing? N N  Doing errands, shopping? N N  Preparing Food and eating ? N -  Using the Toilet? N -  In the past six months, have you accidently leaked urine? Y -  Comment weak bladder and wears poise pads as needed -  Do you have problems with loss of  bowel control? N -  Managing your Medications? N -  Managing your Finances? N -  Housekeeping or managing your Housekeeping? N -  Some recent data might be hidden    Patient Care Team: Orma Flaming, MD as PCP - General (Family Medicine) Mauri Pole, MD as Consulting Physician (Gastroenterology) Bo Merino, MD as Consulting Physician (Rheumatology) Tat, Eustace Quail, DO as Consulting Physician (Neurology)  Indicate any recent Medical Services you may have received from other than [facility] providers in the past year (date may be approximate).     Assessment:   This is a routine wellness examination for [place].  Hearing/Vision screen  Hearing Screening   125Hz  250Hz  500Hz  1000Hz  2000Hz  3000Hz  4000Hz  6000Hz  8000Hz   Right ear:           Left ear:           Comments: Pt Denies any difficulty hearing at this time  Vision Screening Comments: Pt hasn't followed up with an eye dr this year states she will make appt  Dietary issues and exercise activities discussed: Current Exercise Habits: The patient does not participate in regular exercise at present  Goals    . Patient Stated     Lose weight       Depression Screen PHQ 2/9 Scores 07/21/2020 06/22/2020 12/23/2019 02/10/2018 08/16/2017 06/12/2017 03/28/2017  PHQ - 2 Score 0 0 0 0 0 0 0  PHQ- 9 Score - - 12 - - - -    Fall Risk Fall Risk  07/21/2020 07/19/2020 07/11/2020 06/22/2020 01/09/2019  Falls in the past  year? 0 0 0 0 0  Number falls in past yr: 0 0 - - 0  Injury with Fall? 0 0 - - 0  Risk for fall due to : Impaired mobility;Impaired balance/gait;Impaired vision - - No Fall Risks -  Follow up Falls prevention discussed - - - Falls evaluation completed    Any stairs in or around the home? Yes  If so, are there any without handrails? No  Home free of loose throw rugs in walkways, pet beds, electrical cords, etc? Yes  Adequate lighting in your home to reduce risk of falls? Yes   ASSISTIVE DEVICES UTILIZED TO PREVENT FALLS:  Life alert? No  Use of a cane, walker or w/c? No  Grab bars in the bathroom? Yes  Shower chair or bench in shower? Yes  Elevated toilet seat or a handicapped toilet? Yes   TIMED UP AND GO:  Was the test performed? No .     Cognitive Function: MMSE - Mini Mental State Exam 01/09/2019  Not completed: Unable to complete     6CIT Screen 07/21/2020  What Year? 0 points  What month? 0 points  Count back from 20 0 points  Months in reverse 0 points  Repeat phrase 0 points    Immunizations Immunization History  Administered Date(s) Administered  . Influenza, High Dose Seasonal PF 09/16/2017  . PFIZER SARS-COV-2 Vaccination 12/09/2019, 12/30/2019    TDAP status: Due, Education has been provided regarding the importance of this vaccine. Advised may receive this vaccine at local pharmacy or Health Dept. Aware to provide a copy of the vaccination record if obtained from local pharmacy or Health Dept. Verbalized acceptance and understanding. Flu Vaccine status: Up to date Pneumococcal vaccine status: Declined,  Education has been provided regarding the importance of this vaccine but patient still declined. Advised may receive this vaccine at local pharmacy or Health Dept. Aware to provide a copy of  the vaccination record if obtained from local pharmacy or Health Dept. Verbalized acceptance and understanding.  Covid-19 vaccine status: Completed vaccines  Qualifies  for Shingles Vaccine? Yes   Zostavax completed No   Shingrix Completed?: No.    Education has been provided regarding the importance of this vaccine. Patient has been advised to call insurance company to determine out of pocket expense if they have not yet received this vaccine. Advised may also receive vaccine at local pharmacy or Health Dept. Verbalized acceptance and understanding.  Screening Tests Health Maintenance  Topic Date Due  . PNA vac Low Risk Adult (1 of 2 - PCV13) Never done  . TETANUS/TDAP  03/29/2027 (Originally 11/10/1960)  . DEXA SCAN  Completed  . COVID-19 Vaccine  Completed  . Hepatitis C Screening  Completed    Health Maintenance  Health Maintenance Due  Topic Date Due  . PNA vac Low Risk Adult (1 of 2 - PCV13) Never done    Colorectal cancer screening: Completed 08/06/17. Repeat every 10 years Mammogram status: Completed 01/14/20. Repeat every year Bone Density status: Completed 01/02/19. Results reflect: Bone density results: OSTEOPOROSIS. Repeat every 2 years.      Additional Screening:  Hepatitis C Screening: Completed 07/03/17  Vision Screening: Recommended annual ophthalmology exams for early detection of glaucoma and other disorders of the eye. Is the patient up to date with their annual eye exam?  No  Who is the provider or what is the name of the office in which the patient attends annual eye exams? [facility]   Dental Screening: Recommended annual dental exams for proper oral hygiene  Community Resource Referral / Chronic Care Management: CRR required this visit?  Yes   CCM required this visit?  yes     Plan:     I have personally reviewed and noted the following in the patient's chart:   . Medical and social history . Use of alcohol, tobacco or illicit drugs  . Current medications and supplements . Functional ability and status . Nutritional status . Physical activity . Advanced directives . List of other  physicians . Hospitalizations, surgeries, and ER visits in previous 12 months . Vitals . Screenings to include cognitive, depression, and falls . Referrals and appointments  In addition, I have reviewed and discussed with patient certain preventive protocols, quality metrics, and best practice recommendations. A written personalized care plan for preventive services as well as general preventive health recommendations were provided to patient.     Willette Brace, LPN   07/22/2670   Nurse Notes: None

## 2020-07-21 NOTE — Addendum Note (Signed)
Addended by: Willette Brace on: 07/21/2020 02:27 PM   Modules accepted: Orders

## 2020-07-21 NOTE — Telephone Encounter (Signed)
Patient called requesting assistance with getting in contact with social worker, Myra Gianotti. She said Dr. Carles Collet suggested she reach out to her but the patient has not had any success after leaving message.

## 2020-07-21 NOTE — Telephone Encounter (Signed)
Informed patient that she can leave a message and their office will contact. Patient voiced understanding.

## 2020-07-23 ENCOUNTER — Encounter: Payer: Self-pay | Admitting: Nurse Practitioner

## 2020-07-26 ENCOUNTER — Telehealth: Payer: Self-pay | Admitting: Family Medicine

## 2020-07-26 NOTE — Progress Notes (Signed)
  Chronic Care Management   Outreach Note  07/26/2020 Name: Sarah Mata MRN: [id number] DOB: 1940/12/08  Referred by: Orma Flaming, MD Reason for referral : No chief complaint on file.   An unsuccessful telephone outreach was attempted today. The patient was referred to the pharmacist for assistance with care management and care coordination.   Follow Up Plan:   Earney [place] Upstream Scheduler

## 2020-07-26 NOTE — Progress Notes (Signed)
[facility]   [phone] [phone]   Clinic Follow up Note   Patient Care Team: Orland Mustard, MD as PCP - General (Family Medicine) Napoleon Form, MD as Consulting Physician (Gastroenterology) Pollyann Savoy, MD as Consulting Physician (Rheumatology) Tat, Octaviano Batty, DO as Consulting Physician (Neurology) 07/27/2020  CHIEF COMPLAINT: Follow-up history of right breast cancer  SUMMARY OF ONCOLOGIC HISTORY: Oncology History  Cancer of right breast, stage 2 (HCC)  02/2014 Initial Biopsy   Right breast mass biopsy showed invasive ductal carcinoma grade 2, 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 Grade 3, Ki-67 20%.   03/2014 Miscellaneous   mammaprint reviewed high-risk luminal type   04/2014 -  Radiation Therapy   Patient underwent radiation to the right chest wall   08/2014 -  Anti-estrogen oral therapy   Letrozole 2.5 mg started in 08/2014 and due to poor tolerance switched to Anastrozole 1.0 mg, put on hold due to elevated liver enzyme since 05/21/2017 and restarted on 06/26/17   08/06/2017 Pathology Results   Diagnosis, colonoscopy 1. Surgical [P], right colon biopsies - COLONIC MUCOSA WITH BENIGN LYMPHOID AGGREGATES. - NO MICROSCOPIC COLITIS, ACTIVE INFLAMMATION, CHRONIC CHANGES OR GRANULOMAS. 2. Surgical [P], left colon biopsies - COLONIC MUCOSA WITH BENIGN LYMPHOID AGGREGATES. - NO MICROSCOPIC COLITIS, ACTIVE INFLAMMATION, CHRONIC CHANGES OR GRANULOMAS.   08/06/2017 Procedure   Colonoscopy by Dr. Lavon Paganini on 08/06/17 IMPRESSION - Diverticulosis in the  sigmoid colon and in the descending colon. - Non-bleeding internal hemorrhoids. - The entire examined colon is normal. Biopsied.    12/20/2017 Mammogram   There are stable lumpectomy changes in the upper-outer right breast. No mass, nonsurgical distortion, or suspicious microcalcification is identified on the right to suggest malignancy.  There is subtle palpable thickening in the 12 o'clock to 1 o'clock left breast just superior to the nipple.   IMPRESSION: Architectural distortion in the 12 o'clock region of the left breast. Malignancy, postsurgical scarring, or complex sclerosing lesion are considerations.   12/20/2017 Breast US   Targeted ultrasound is performed, showing a hypoechoic irregular mass with shadowing at 12 o'clock position 1-2 cm from the nipple measuring approximately 1.3 x 1.6 x 0.7 cm. Ultrasound of the region of patient concern discovered today while she was here in the office at 7:30 position shows normal fatty tissue.   12/23/2017 Procedure   Clip placement FINDINGS: Mammographic images were obtained following stereotactic guided biopsy of left breast distortion. The coil shaped biopsy marking clip is well positioned at the site of distortion in the upper slightly inner quadrant of the left breast.    12/23/2017 Pathology Results   Diagnosis Breast, left, needle core biopsy, upper inner quadrant - DENSE FIBROSIS AND PIGMENTED HISTIOCYTES CONSISTENT WITH PREVIOUS SURGICAL SITE - FOCAL FIBROCYSTIC CHANGES - NO MALIGNANCY IDENTIFIED     CURRENT THERAPY:  Letrozole started in 08/2014 and due to poor tolerance switched to Anastrozole, put on hold due to elevated liver enzyme since 05/21/2017 and restarted on 06/26/17  INTERVAL HISTORY: Mrs. Sammarco returns for follow-up as scheduled.  She was last seen in clinic by Dr. Mosetta Putt  on 01/21/2020.  Her Parkinson's has progressed, she is more off balance.  Ambulating with a walker.  Her energy is lower.  She is hoping to get a Research officer, political party to help her with community resources including transportation among other things; the application is pending.  Her appetite is stable, she is careful what and when she eats.  She denies bone pain, hot flashes on anastrozole.  She denies recent fever, chills, cough, chest pain, shortness of breath.  Denies changes in her bowel habits except "for the better."  Denies any bleeding.  Denies any new lump/mass, or nipple changes in her breast.   MEDICAL HISTORY:  Past Medical History:  Diagnosis Date  . Allergy   . Arthritis   . Breast cancer ([place])   . Heart murmur   . Lupus ([place])   . Personal history of chemotherapy   . Personal history of radiation therapy   . Tremor of right hand 08/02/2017    SURGICAL HISTORY: Past Surgical History:  Procedure Laterality Date  . BREAST BIOPSY    . BREAST LUMPECTOMY Right 2015  . Broken Nose     Patient fell.  Marland Kitchen FOOT SURGERY Right 2010  . REDUCTION MAMMAPLASTY Bilateral 2015    I have reviewed the social history and family history with the patient and they are unchanged from previous note.  ALLERGIES:  is allergic to tetracyclines & related.  MEDICATIONS:  Current Outpatient Medications  Medication Sig Dispense Refill  . anastrozole (ARIMIDEX) 1 MG tablet TAKE 1 TABLET DAILY 90 tablet 3  . Ascorbic Acid (VITAMIN C) 1000 MG tablet Take 1,000 mg by mouth daily.    . Cholecalciferol (VITAMIN D3) 2000 units capsule Take 5,000 Units by mouth daily.     . Coenzyme Q10 (CO Q-10) 100 MG CAPS Take by mouth. Two caps daily    . hydrochlorothiazide (HYDRODIURIL) 12.5 MG tablet Take 1 tablet (12.5 mg total) by mouth daily. 90 tablet 0  . UNABLE TO FIND 1 MD Bone builder; 1 in Am, and 1 in PM    . carbidopa-levodopa (SINEMET IR) 25-100 MG tablet Take 1 tablet by mouth 3 (three) times daily. (Patient not taking: Reported on 07/21/2020) 90 tablet 0   No current facility-administered medications for this visit.    PHYSICAL EXAMINATION: ECOG PERFORMANCE  STATUS: 1 - Symptomatic but completely ambulatory  Vitals:   07/27/20 1234  BP: (!) 149/74  Pulse: 67  Resp: 17  Temp: 98.7 F (37.1 C)  SpO2: 97%   Filed Weights   07/27/20 1234  Weight: 187 lb 4.8 oz (85 kg)    GENERAL:alert, no distress and comfortable SKIN: No rash to exposed skin EYES: sclera clear NECK: Without mass LYMPH:  no palpable cervical or supraclavicular lymphadenopathy  LUNGS:  normal breathing effort HEART:  no lower extremity edema NEURO: alert & oriented x 3 with fluent speech, upper extremity tremor Breast exam: S/p right lumpectomy and bilateral breast reconstruction.  No nipple discharge or inversion.  Incisions completely healed.  There is a palpable 0.5 cm superficial nodule in the 9 o'clock position of the right breast, stable over previous exams.  No other palpable mass in either breast or axilla that I could appreciate.  LABORATORY DATA:  I have reviewed the data as listed CBC Latest Ref Rng & Units 07/27/2020 07/11/2020 01/21/2020  WBC 4.0 - 10.5 K/uL 5.2 5.9 5.8  Hemoglobin 12.0 - 15.0 g/dL 13.5 14.5 13.8  Hematocrit 36 - 46 % 38.8 43.0 40.6  Platelets 150 - 400 K/uL 180 171 151     CMP Latest Ref Rng & Units 07/27/2020 07/12/2020 07/11/2020  Glucose 70 - 99 mg/dL 92 104(H) 91  BUN 8 - 23 mg/dL $Remove'8 11 10  'ntOIkvl$ Creatinine 0.44 - 1.00 mg/dL 0.87 0.96(H) 0.85  Sodium 135 - 145 mmol/L 125(L) 130(L) 128(L)  Potassium 3.5 - 5.1 mmol/L 3.8 4.3 4.3  Chloride 98 - 111 mmol/L 88(L) 95(L) 93(L)  CO2 22 - 32 mmol/L $RemoveB'28 24 24  'CJStxvtY$ Calcium 8.9 - 10.3 mg/dL 10.0 9.6 9.7  Total Protein 6.5 - 8.1 g/dL 7.5 - 7.4  Total Bilirubin 0.3 - 1.2 mg/dL 0.7 - 0.8  Alkaline Phos 38 - 126 U/L 116 - -  AST 15 - 41 U/L 20 - 21  ALT 0 - 44 U/L <6 - 13      RADIOGRAPHIC STUDIES: I have personally reviewed the radiological images as listed and agreed with the findings in the report. No results found.   ASSESSMENT & PLAN: 79 y.o.woman   1. Right breast cancer stage IIA,  ER+/PR+/HER2-, mammaprint high risk -Diagnosed in 02/2014 s/p right lumpectomy, MammaPrint showed high risk.  She completed adjuvant radiation and began letrozole in 08/2014 which was changed to anastrozole in 05/2017 due to side effects.  The plan is to continue for total of 7 years until mid/late 2022 if she can tolerate  2. Bone health  -DEXA in 12/2018 shows osteoporosis with T score -2.6 at forearm radius -On calcium and vitamin D and a "bone builder" supplement  3. Lupus  -Continue follow-up with Dr. Estanislado Pandy  4. Parkinson's Disease  -Followed by Dr. Carles Collet, patient declined carbidopa-levodopa; opted for natural remedies including physical and mental exercise until her Parkinson's progressed and she recently started Sinemet -she is applying for SW case worker to help with community resources, transportation, etc.  -Continue follow-up with neuro  Disposition: Mrs. Pewitt is clinically doing well from a breast cancer standpoint.  She continues anastrozole which she tolerates well overall.  Her breast exam is unchanged.  Mammogram/ultrasound in 12/2019 are benign.  CBC and CMP are unremarkable.  No clinical concern for recurrence.  She will continue breast cancer surveillance and AI.  She will return for routine lab and follow-up in 6 months.   Orders Placed This Encounter  Procedures  . MM Digital Screening    Standing Status:   Future    Standing Expiration Date:   07/27/2021    Order Specific Question:   Reason for Exam (SYMPTOM  OR DIAGNOSIS REQUIRED)    Answer:   h/o right breast cancer s/p lumpectomy, radiation. on AI    Order Specific Question:   Preferred imaging location?    Answer:   [facility]   All questions were answered. The patient knows to call the clinic with any problems, questions or concerns. No barriers to learning were detected. Total encounter time was 30 minutes.      Alla Feeling, NP 07/27/20

## 2020-07-27 ENCOUNTER — Other Ambulatory Visit: Payer: Self-pay

## 2020-07-27 ENCOUNTER — Inpatient Hospital Stay (HOSPITAL_BASED_OUTPATIENT_CLINIC_OR_DEPARTMENT_OTHER): Payer: Medicare Other | Admitting: Nurse Practitioner

## 2020-07-27 ENCOUNTER — Ambulatory Visit: Payer: Medicare Other | Admitting: Hematology

## 2020-07-27 ENCOUNTER — Inpatient Hospital Stay: Payer: Medicare Other | Attending: Nurse Practitioner

## 2020-07-27 ENCOUNTER — Encounter: Payer: Self-pay | Admitting: Nurse Practitioner

## 2020-07-27 VITALS — BP 149/74 | HR 67 | Temp 98.7°F | Resp 17 | Ht 63.0 in | Wt 187.3 lb

## 2020-07-27 DIAGNOSIS — C50411 Malignant neoplasm of upper-outer quadrant of right female breast: Secondary | ICD-10-CM | POA: Diagnosis not present

## 2020-07-27 DIAGNOSIS — C50911 Malignant neoplasm of unspecified site of right female breast: Secondary | ICD-10-CM | POA: Diagnosis not present

## 2020-07-27 DIAGNOSIS — M329 Systemic lupus erythematosus, unspecified: Secondary | ICD-10-CM | POA: Diagnosis not present

## 2020-07-27 DIAGNOSIS — M810 Age-related osteoporosis without current pathological fracture: Secondary | ICD-10-CM | POA: Diagnosis not present

## 2020-07-27 DIAGNOSIS — Z79811 Long term (current) use of aromatase inhibitors: Secondary | ICD-10-CM | POA: Diagnosis not present

## 2020-07-27 DIAGNOSIS — Z170 Estrogen receptor positive status [ER+]: Secondary | ICD-10-CM | POA: Insufficient documentation

## 2020-07-27 DIAGNOSIS — Z1231 Encounter for screening mammogram for malignant neoplasm of breast: Secondary | ICD-10-CM | POA: Diagnosis not present

## 2020-07-27 DIAGNOSIS — E559 Vitamin D deficiency, unspecified: Secondary | ICD-10-CM

## 2020-07-27 DIAGNOSIS — G20 Parkinson's disease: Secondary | ICD-10-CM | POA: Diagnosis not present

## 2020-07-27 LAB — COMPREHENSIVE METABOLIC PANEL
ALT: <6 U/L (ref 0–44)
AST: 20 U/L (ref 15–41)
Albumin: 3.8 g/dL (ref 3.5–5.0)
Alkaline Phosphatase: 116 U/L (ref 38–126)
Anion gap: 9 (ref 5–15)
BUN: 8 mg/dL (ref 8–23)
CO2: 28 mmol/L (ref 22–32)
Calcium: 10 mg/dL (ref 8.9–10.3)
Chloride: 88 mmol/L — ABNORMAL LOW (ref 98–111)
Creatinine, Ser: 0.87 mg/dL (ref 0.44–1.00)
GFR calc Af Amer: >60 mL/min (ref >60)
GFR calc non Af Amer: >60 mL/min (ref >60)
Glucose, Bld: 92 mg/dL (ref 70–99)
Potassium: 3.8 mmol/L (ref 3.5–5.1)
Sodium: 125 mmol/L — ABNORMAL LOW (ref 135–145)
Total Bilirubin: 0.7 mg/dL (ref 0.3–1.2)
Total Protein: 7.5 g/dL (ref 6.5–8.1)

## 2020-07-27 LAB — CBC WITH DIFFERENTIAL/PLATELET
Abs Immature Granulocytes: 0 10*3/uL (ref 0.00–0.07)
Basophils Absolute: 0.1 10*3/uL (ref 0.0–0.1)
Basophils Relative: 1 %
Eosinophils Absolute: 0.1 10*3/uL (ref 0.0–0.5)
Eosinophils Relative: 2 %
HCT: 38.8 % (ref 36.0–46.0)
Hemoglobin: 13.5 g/dL (ref 12.0–15.0)
Immature Granulocytes: 0 %
Lymphocytes Relative: 19 %
Lymphs Abs: 1 10*3/uL (ref 0.7–4.0)
MCH: 30.1 pg (ref 26.0–34.0)
MCHC: 34.8 g/dL (ref 30.0–36.0)
MCV: 86.4 fL (ref 80.0–100.0)
Monocytes Absolute: 0.7 10*3/uL (ref 0.1–1.0)
Monocytes Relative: 14 %
Neutro Abs: 3.3 10*3/uL (ref 1.7–7.7)
Neutrophils Relative %: 64 %
Platelets: 180 10*3/uL (ref 150–400)
RBC: 4.49 MIL/uL (ref 3.87–5.11)
RDW: 12 % (ref 11.5–15.5)
WBC: 5.2 10*3/uL (ref 4.0–10.5)
nRBC: 0 % (ref 0.0–0.2)

## 2020-07-27 LAB — VITAMIN D 25 HYDROXY (VIT D DEFICIENCY, FRACTURES): Vit D, 25-Hydroxy: 62.68 ng/mL (ref 30–100)

## 2020-07-28 ENCOUNTER — Telehealth: Payer: Self-pay | Admitting: Family Medicine

## 2020-07-28 ENCOUNTER — Telehealth: Payer: Self-pay | Admitting: Hematology

## 2020-07-28 LAB — CANCER ANTIGEN 27.29: CA 27.29: 40.7 U/mL — ABNORMAL HIGH (ref 0.0–38.6)

## 2020-07-28 NOTE — Progress Notes (Signed)
  Chronic Care Management   Note  07/28/2020 Name: Marijayne Rauth MRN: [id number] DOB: [birth year]  Cerina Leary is a 79 y.o. year old female who is a primary care patient of Orma Flaming, MD. I reached out to [name] by phone today in response to a referral sent by [name] PCP, Orma Flaming, MD.   Ms. Feigenbaum was given information about Chronic Care Management services today including:  1. CCM service includes personalized support from designated clinical staff supervised by her physician, including individualized plan of care and coordination with other care providers 2. 24/7 contact phone numbers for assistance for urgent and routine care needs. 3. Service will only be billed when office clinical staff spend 20 minutes or more in a month to coordinate care. 4. Only one practitioner may furnish and bill the service in a calendar month. 5. The patient may stop CCM services at any time (effective at the end of the month) by phone call to the office staff.   Patient agreed to services and verbal consent obtained.   Follow up plan:   Earney [place] Upstream Scheduler

## 2020-07-28 NOTE — Telephone Encounter (Signed)
Scheduled appts per 9/8 los. Pt confirmed appt date and time.

## 2020-07-28 NOTE — Progress Notes (Signed)
  Chronic Care Management   Note  07/28/2020 Name: Sarah Mata MRN: [id number] DOB: May 19, 1941  [name] is a 79 y.o. year old female who is a primary care patient of Orma Flaming, MD. I reached out to [name] by phone today in response to a referral sent by [name] PCP, Orma Flaming, MD.   [name] was given information about Chronic Care Management services today including:  1. CCM service includes personalized support from designated clinical staff supervised by her physician, including individualized plan of care and coordination with other care providers 2. 24/7 contact phone numbers for assistance for urgent and routine care needs. 3. Service will only be billed when office clinical staff spend 20 minutes or more in a month to coordinate care. 4. Only one practitioner may furnish and bill the service in a calendar month. 5. The patient may stop CCM services at any time (effective at the end of the month) by phone call to the office staff.   Patient agreed to services and verbal consent obtained.   Follow up plan:   Earney Hamburg Upstream Scheduler

## 2020-07-29 ENCOUNTER — Ambulatory Visit: Payer: Medicare Other

## 2020-07-29 DIAGNOSIS — I10 Essential (primary) hypertension: Secondary | ICD-10-CM

## 2020-07-29 DIAGNOSIS — E782 Mixed hyperlipidemia: Secondary | ICD-10-CM

## 2020-07-29 NOTE — Progress Notes (Signed)
Chronic Care Management Pharmacy  Name: Sarah Mata  MRN: [id number] DOB: 1941/01/01  Chief Complaint/ HPI  Sarah Mata,  79 y.o., female presents for their Initial CCM visit with the clinical pharmacist via telephone due to COVID-19 Pandemic.  Has some concern related to diet, sinemet medication schedule, competing financial priorities. States that she is very nervous at the time of phone call.   Currently has whole foods ordered to her house every 1-2 weeks. Testing BP at home - 126/70 today, pulse 67.   PCP : Orma Flaming, MD  Chronic conditions include:  Encounter Diagnoses  Name Primary?  . Benign essential HTN Yes  . Mixed hyperlipidemia     Office Visits:  07/21/2020 [facility]): Cardiac Risk Factors include: advanced age (>34mn, >>45women);obesity (BMI >30kg/m2);hypertension;dyslipidemia 07/11/2020 (PCP): c/o swelling including swollen lip. Amlodipine stopped, hctz 12.5 mg trial. If consitipation does not improve with citrucel will need to send to GI.  06/22/2020 (PCP): amlodipine initiated, mild cholesterol elevation but 10-year risk 31.9%, discussion of future crestor initiation.  Consult Visit: 07/27/2020 ([facility]): Continue on AI until mid/late 2022 as tolerated; taking ca+vit-d. Recently started sinemet.  07/19/2020 (Dr Tat, neurology): started on sinemet 25/100 at 9a/1p/5p. CV exercise strongly encouraged, declined SW resources.   Patient Active Problem List   Diagnosis Date Noted  . Benign essential HTN 07/11/2020  . Hyperlipidemia 12/28/2019  . Parkinson's disease ([place] 12/23/2019  . 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 Eustachian tube dysfunction 09/16/2017  . Atherosclerosis of aorta ([place] 09/16/2017  . Systemic lupus erythematosus ([place] 09/16/2017  . Chronic diarrhea 06/15/2017  . Vitamin D deficiency 05/21/2017  . Cancer of right breast, stage 2 ([place] 03/28/2017   Past Surgical History:  Procedure Laterality Date  . BREAST  BIOPSY    . BREAST LUMPECTOMY Right 2015  . Broken Nose     Patient fell.  .Marland KitchenFOOT SURGERY Right 2010  . REDUCTION MAMMAPLASTY Bilateral 2015   Family History  Problem Relation Age of Onset  . Cancer Mother        breast cancer   . Breast cancer Mother   . Hypertension Mother   . Diabetes Sister   . Hypertension Sister   . Heart disease Father   . Breast cancer Sister   . Colon cancer Neg Hx   . Esophageal cancer Neg Hx   . Pancreatic cancer Neg Hx   . Rectal cancer Neg Hx   . Stomach cancer Neg Hx    Allergies  Allergen Reactions  . Tetracyclines & Related Other (See Comments)    Stomach burning   Outpatient Encounter Medications as of 07/29/2020  Medication Sig Note  . anastrozole (ARIMIDEX) 1 MG tablet TAKE 1 TABLET DAILY   . Ascorbic Acid (VITAMIN C) 1000 MG tablet Take 1,000 mg by mouth daily.   . carbidopa-levodopa (SINEMET IR) 25-100 MG tablet Take 1 tablet by mouth 3 (three) times daily. (Patient not taking: Reported on 07/21/2020)   . Cholecalciferol (VITAMIN D3) 2000 units capsule Take 5,000 Units by mouth daily.  06/22/2020: 4000 units  . Coenzyme Q10 (CO Q-10) 100 MG CAPS Take by mouth. Two caps daily   . hydrochlorothiazide (HYDRODIURIL) 12.5 MG tablet Take 1 tablet (12.5 mg total) by mouth daily.   .Marland KitchenUNABLE TO FIND 1 MD Bone builder; 1 in Am, and 1 in PM    No facility-administered encounter medications on file as of 07/29/2020.   Patient Care Team    Relationship Specialty Notifications  Start End  Orma Flaming, MD PCP - General Family Medicine  09/18/19   Mauri Pole, MD Consulting Physician Gastroenterology  06/26/17   Bo Merino, MD Consulting Physician Rheumatology  07/01/17   Tat, Eustace Quail, DO Consulting Physician Neurology  01/09/19   Madelin Rear, [facility] Pharmacist Pharmacist  07/28/20    Comment: [phone]  Madelin Rear [facility] Pharmacist Pharmacist  07/28/20    Comment: [phone]   Current Diagnosis/Assessment: Goals Addressed             This Visit's Progress   . PharmD Care Plan       CARE PLAN ENTRY (see longitudinal plan of care for additional care plan information)  Current Barriers:  . Chronic Disease Management support, education, and care coordination needs related to Hypertension and Hyperlipidemia   Hypertension BP Readings from Last 3 Encounters:  07/27/20 (!) 149/74  07/19/20 (!) 173/78  07/11/20 (!) 156/92   . Pharmacist Clinical Goal(s): o Over the next 180 days, patient will work with PharmD and providers to achieve BP goal <140/90 . Current regimen:  o Hydrochlorothiazide 12.5 mg once daily . Interventions: o Home monitoring, diet/exercise recommendations . Patient self care activities - Over the next 180 days, patient will: o Check BP at least once every 1-2 weeks, document, and provide at future appointments o Ensure daily salt intake < 2300 mg/day  Hyperlipidemia Lab Results  Component Value Date/Time   LDLCALC 154 (H) 12/25/2019 08:28 AM   . Pharmacist Clinical Goal(s): o Over the next 180 days, patient will work with PharmD and providers to achieve LDL goal < 130 . Current regimen:  o N/a, no medications at this time . Interventions: o Diet recommendations, discussion on statin initiation . Patient self care activities - Over the next 180 days, patient will: o Limit fatty/fried foods, sweets o Exercise/walk along with PT as appropriate with   Medication management . Pharmacist Clinical Goal(s): o Over the next 180 days, patient will work with PharmD and providers to maintain optimal medication adherence . [facility] . Interventions o Comprehensive medication review performed. o Continue current medication management strategy . Patient self care activities - Over the next 180 days, patient will: o Take medications as prescribed o Report any questions or concerns to PharmD and/or provider(s) Initial goal documentation.      Hypertension   BP goal  <140/90  BP Readings from Last 3 Encounters:  07/27/20 (!) 149/74  07/19/20 (!) 173/78  07/11/20 (!) 156/92   Patient has failed these meds in the past: amlodipine (swelling). Patient checks BP at home daily. At goal today. Overall above goal based on office readings. Last few days has been 120s/70s at home. Currently taking the following medications:  . Hydrochlorothiazide 12.5 mg once daily   We discussed diet and exercise extensively.   Plan  Continue current medications.   Hyperlipidemia   LDL goal < 100 over next 6 months  Lipid Panel     Component Value Date/Time   CHOL 237 (H) 12/25/2019 0828   TRIG 159.0 (H) 12/25/2019 0828   HDL 51.10 12/25/2019 0828   LDLCALC 154 (H) 12/25/2019 0828    Hepatic Function Latest Ref Rng & Units 07/27/2020 07/11/2020 01/21/2020  Total Protein 6.5 - 8.1 g/dL 7.5 7.4 7.1  Albumin 3.5 - 5.0 g/dL 3.8 - 3.7  AST 15 - 41 U/L _0 ALT 0 - 44 U/L <_1 Alk Phosphatase 38 - 126 U/L 116 -  111  Total Bilirubin 0.3 - 1.2 mg/dL 0.7 0.8 0.5    The 10-year ASCVD risk score Mikey Bussing DC Jr., et al., 2013) is: 30.3%   Values used to calculate the score:     Age: 23 years     Sex: Female     Is Non-Hispanic African American: No     Diabetic: No     Tobacco smoker: No     Systolic Blood Pressure: 097 mmHg     Is BP treated: Yes     HDL Cholesterol: 51.1 mg/dL     Total Cholesterol: 237 mg/dL   Patient is currently not at goal on the following medications:  . N/a, had been trying to control on diet alone.  We discussed:  diet and exercise extensively. Resources to help with nutrition/groceries provided. Briefly reviewed statin initiation.  Plan  Continue current medications and control with diet and exercise. Review statin initiation following upcoming pcp visit.  Osteopenia / Osteoporosis   Last DEXA Scan: 2020. Osteoporotic. Previously on alendronate.   Vit D, 25-Hydroxy  Date Value Ref Range Status  07/27/2020 62.68 30 - 100 ng/mL  Final    Comment:    (NOTE) Vitamin D deficiency has been defined by the [place] practice guideline as a level of serum 25-OH  vitamin D less than 20 ng/mL (1,2). The Endocrine Society went on to  further define vitamin D insufficiency as a level between 21 and 29  ng/mL (2).  1. IOM (Institute of Medicine). 2010. Dietary reference intakes for  calcium and D. [place]: The [employer]. 2. Holick MF, Binkley Blauvelt, Bischoff-Ferrari HA, et al. Evaluation,  treatment, and prevention of vitamin D deficiency: an Endocrine  Society clinical practice guideline, JCEM. 2011 Jul; 96(7): 1911-30.  Performed at [facility], [place] [address], [place], [place] [address]     Previously took alendronate. Currently taking the following medications:  . Calcium/vitamin-d supplementation  We discussed:  Recommend [phone] units of vitamin D daily. Recommend 1200 mg of calcium daily from dietary and supplemental sources. Recommend weight-bearing and muscle strengthening exercises for building and maintaining bone density.  Plan  Continue current medications.  Vaccines   Immunization History  Administered Date(s) Administered  . Influenza, High Dose Seasonal PF 09/16/2017  . PFIZER SARS-COV-2 Vaccination 12/09/2019, 12/30/2019   Reviewed patient's vaccination history. Discussion limited to discussion of annual flu, covid booster.  Plan  Recommended patient receive annual flu and covid booster.  Further discuss vaccines at next visit.  Medication Management Coordination   Receives prescription medications from:  [place], [place] [address] Phone: [phone] Fax: [phone]  [id number]: [phone]: [phone]  Has application to fill out for seeing  social worker recommended by Dr Tat.   Provided information on SHIIP if wanting to explore additional plan options, however says she would lose [employer]ney stipend if she was to change plans.   Provided information on assistance related to rent, utility costs, non-medical transportation, nutrition, in home aid.   Plan  Continue current medication management strategy. ___________________________ SDOH (Social Determinants of Health) assessments performed: Yes.  Future Appointments  Date Time Provider [place] 08/11/2020  2:00 PM WOrma Flaming MD [facility]  08/30/2020  1:00 PM [facility] CCM PHARMACIST [facility]  09/28/2020  3:00 PM Marzetta Board, DPM [facility]  01/13/2021  3:00 PM Tat, Eustace Quail, DO [facility] None  01/25/2021  1:45 PM [facility] None  01/25/2021  2:20 PM Truitt Merle, MD [facility] None  07/28/2021  1:45 PM [facility] HEALTH COACH [facility]   Visit follow-up:  . RPH follow-up: 1 month telephone visit.  Madelin Rear, Pharm.D., BCGP Clinical Pharmacist [facility] [phone]

## 2020-08-02 ENCOUNTER — Other Ambulatory Visit: Payer: Self-pay | Admitting: Family Medicine

## 2020-08-02 NOTE — Patient Instructions (Addendum)
Hello Sarah Mata,   Please review the information I have included that outlines options for assistance related to rent, utilities, meals, non-medical transportation, medicare plans.   You can contact [place] to ask about in-home aid including help cleaning - their number is [phone].  Call me at [phone] with any questions!  Thank you, Edyth Gunnels., Clinical Pharmacist  Goals Addressed            This Visit's Progress   . PharmD Care Plan       CARE PLAN ENTRY (see longitudinal plan of care for additional care plan information)  Current Barriers:  . Chronic Disease Management support, education, and care coordination needs related to Hypertension and Hyperlipidemia   Hypertension BP Readings from Last 3 Encounters:  07/27/20 (!) 149/74  07/19/20 (!) 173/78  07/11/20 (!) 156/92   . Pharmacist Clinical Goal(s): o Over the next 180 days, patient will work with PharmD and providers to achieve BP goal <140/90 . Current regimen:  o Hydrochlorothiazide 12.5 mg once daily . Interventions: o Home monitoring, diet/exercise recommendations . Patient self care activities - Over the next 180 days, patient will: o Check BP at least once every 1-2 weeks, document, and provide at future appointments o Ensure daily salt intake < 2300 mg/day  Hyperlipidemia Lab Results  Component Value Date/Time   LDLCALC 154 (H) 12/25/2019 08:28 AM   . Pharmacist Clinical Goal(s): o Over the next 180 days, patient will work with PharmD and providers to achieve LDL goal < 130 . Current regimen:  o N/a, no medications at this time . Interventions: o Diet recommendations, discussion on statin initiation . Patient self care activities - Over the next 180 days, patient will: o Limit fatty/fried foods, sweets o Exercise/walk along with PT as appropriate with   Medication management . Pharmacist Clinical Goal(s): o Over the next 180 days, patient will work with PharmD and providers  to maintain optimal medication adherence . [facility] . Interventions o Comprehensive medication review performed. o Continue current medication management strategy . Patient self care activities - Over the next 180 days, patient will: o Take medications as prescribed o Report any questions or concerns to PharmD and/or provider(s) Initial goal documentation.      Sarah Mata was given information about Chronic Care Management services today including:  1. CCM service includes personalized support from designated clinical staff supervised by her physician, including individualized plan of care and coordination with other care providers 2. 24/7 contact phone numbers for assistance for urgent and routine care needs. 3. Standard insurance, coinsurance, copays and deductibles apply for chronic care management only during months in which we provide at least 20 minutes of these services. Most insurances cover these services at 100%, however patients may be responsible for any copay, coinsurance and/or deductible if applicable. This service may help you avoid the need for more expensive face-to-face services. 4. Only one practitioner may furnish and bill the service in a calendar month. 5. The patient may stop CCM services at any time (effective at the end of the month) by phone call to the office staff.  Patient agreed to services and verbal consent obtained.   The patient verbalized understanding of instructions provided today and agreed to receive a mailed copy of patient instruction and/or educational materials. Telephone follow up appointment with pharmacy team member scheduled for: See next appointment with "Care Management Staff" under "What's Next" below.   Madelin Rear, Pharm.D., BCGP Clinical Pharmacist [facility] [phone] High Cholesterol  High cholesterol is a condition in which the blood has high levels of a white, waxy, fat-like substance  (cholesterol). The human body needs small amounts of cholesterol. The liver makes all the cholesterol that the body needs. Extra (excess) cholesterol comes from the food that we eat. Cholesterol is carried from the liver by the blood through the blood vessels. If you have high cholesterol, deposits (plaques) may build up on the walls of your blood vessels (arteries). Plaques make the arteries narrower and stiffer. Cholesterol plaques increase your risk for heart attack and stroke. Work with your health care provider to keep your cholesterol levels in a healthy range. What increases the risk? This condition is more likely to develop in people who:  Eat foods that are high in animal fat (saturated fat) or cholesterol.  Are overweight.  Are not getting enough exercise.  Have a family history of high cholesterol. What are the signs or symptoms? There are no symptoms of this condition. How is this diagnosed? This condition may be diagnosed from the results of a blood test.  If you are older than age 41, your health care provider may check your cholesterol every 4-6 years.  You may be checked more often if you already have high cholesterol or other risk factors for heart disease. The blood test for cholesterol measures:  "Bad" cholesterol (LDL cholesterol). This is the main type of cholesterol that causes heart disease. The desired level for LDL is less than 100.  "Good" cholesterol (HDL cholesterol). This type helps to protect against heart disease by cleaning the arteries and carrying the LDL away. The desired level for HDL is 60 or higher.  Triglycerides. These are fats that the body can store or burn for energy. The desired number for triglycerides is lower than 150.  Total cholesterol. This is a measure of the total amount of cholesterol in your blood, including LDL cholesterol, HDL cholesterol, and triglycerides. A healthy number is less than 200. How is this treated? This condition is  treated with diet changes, lifestyle changes, and medicines. Diet changes  This may include eating more whole grains, fruits, vegetables, nuts, and fish.  This may also include cutting back on red meat and foods that have a lot of added sugar. Lifestyle changes  Changes may include getting at least 40 minutes of aerobic exercise 3 times a week. Aerobic exercises include walking, biking, and swimming. Aerobic exercise along with a healthy diet can help you maintain a healthy weight.  Changes may also include quitting smoking. Medicines  Medicines are usually given if diet and lifestyle changes have failed to reduce your cholesterol to healthy levels.  Your health care provider may prescribe a statin medicine. Statin medicines have been shown to reduce cholesterol, which can reduce the risk of heart disease. Follow these instructions at home: Eating and drinking If told by your health care provider:  Eat chicken (without skin), fish, veal, shellfish, ground [place] breast, and round or loin cuts of red meat.  Do not eat fried foods or fatty meats, such as hot dogs and salami.  Eat plenty of fruits, such as apples.  Eat plenty of vegetables, such as broccoli, potatoes, and carrots.  Eat beans, peas, and lentils.  Eat grains such as barley, rice, couscous, and bulgur wheat.  Eat pasta without cream sauces.  Use skim or nonfat milk, and eat low-fat or nonfat yogurt and cheeses.  Do not eat or drink whole milk, cream,  ice cream, egg yolks, or hard cheeses.  Do not eat stick margarine or tub margarines that contain trans fats (also called partially hydrogenated oils).  Do not eat saturated tropical oils, such as coconut oil and palm oil.  Do not eat cakes, cookies, crackers, or other baked goods that contain trans fats.  General instructions  Exercise as directed by your health care provider. Increase your activity level with activities such as gardening, walking, and taking the  stairs.  Take over-the-counter and prescription medicines only as told by your health care provider.  Do not use any products that contain nicotine or tobacco, such as cigarettes and e-cigarettes. If you need help quitting, ask your health care provider.  Keep all follow-up visits as told by your health care provider. This is important. Contact a health care provider if:  You are struggling to maintain a healthy diet or weight.  You need help to start on an exercise program.  You need help to stop smoking. Get help right away if:  You have chest pain.  You have trouble breathing. This information is not intended to replace advice given to you by your health care provider. Make sure you discuss any questions you have with your health care provider. Document Revised: 11/08/2017 Document Reviewed: 05/05/2016 Elsevier Patient Education  [place].

## 2020-08-04 ENCOUNTER — Telehealth: Payer: Self-pay | Admitting: Neurology

## 2020-08-04 NOTE — Telephone Encounter (Signed)
I recommend the U-step but generally insurance doesn't pay for it.  Otherwise, just the traditional 2 wheel walker with 2 tennis balls is great!  Re: dentist and eye doc.  She should contact PCP as this is out of my field but I don't think [facility] owns any dental practices.

## 2020-08-04 NOTE — Telephone Encounter (Signed)
Patient thanked me for calling.   Patient notified and voiced understanding.

## 2020-08-04 NOTE — Telephone Encounter (Signed)
Patient called in wanting to find out if Dr. Carles Collet would recommend a dentist and eye doctor that are within [facility] for her. She would like to stay within the [facility] "family". She also would like to find out about how and what kind of walker Dr. Carles Collet would recommend for her to get around a little better. Perhaps with wheels.

## 2020-08-08 NOTE — Progress Notes (Signed)
Agree with documentation and plan by Madelin Rear, pharmacist.  Orma Flaming, MD [place]

## 2020-08-10 ENCOUNTER — Telehealth: Payer: Self-pay | Admitting: Family Medicine

## 2020-08-10 NOTE — Telephone Encounter (Signed)
   [facility] 08/10/2020   Name: Sarah Mata   MRN: [id number]   DOB: 15-Dec-1940   AGE: 79 y.o.   GENDER: female   PCP Orma Flaming, MD.   Spoke with patient today regarding Community Resource Referral for transportation. Patient stated that she has already contacted [facility] and her ride has been scheduled for her appointment on 08/11/20. Patient stated she has no additional needs at this time.   Closing referral pending any other needs of patient.   [place], Care Management Phone: [phone] Email: [email or web address]

## 2020-08-11 ENCOUNTER — Encounter: Payer: Self-pay | Admitting: Family Medicine

## 2020-08-11 ENCOUNTER — Other Ambulatory Visit: Payer: Self-pay

## 2020-08-11 ENCOUNTER — Ambulatory Visit (INDEPENDENT_AMBULATORY_CARE_PROVIDER_SITE_OTHER): Payer: Medicare Other | Admitting: Family Medicine

## 2020-08-11 VITALS — BP 126/75 | HR 71 | Temp 98.2°F | Ht 63.0 in | Wt 181.2 lb

## 2020-08-11 DIAGNOSIS — G20 Parkinson's disease: Secondary | ICD-10-CM

## 2020-08-11 DIAGNOSIS — I10 Essential (primary) hypertension: Secondary | ICD-10-CM | POA: Diagnosis not present

## 2020-08-11 NOTE — Progress Notes (Signed)
Patient: Sarah Mata MRN: [id number] DOB: 03/13/1941 PCP: Orma Flaming, MD     Subjective:  Chief Complaint  Patient presents with  . Hypertension    HPI: The patient is a 79 y.o. female who presents today for 1 month HTN follow up. Pt brought BP log.  Hypertension: Here for follow up of hypertension.  Currently on hctz 12.5mg  daily . Home readings range from 469-629 BMWUXLKG/40-10 diastolic. Takes medication as prescribed and denies any side effects. Exercise includes none. Weight has been stable. Denies any chest pain, headaches, shortness of breath, vision changes, swelling in lower extremities.   She is wanting to see if PT can be started. She has PD. I would also like to see if her home is safe and figure out what type of walker she needs to prevent falls. She has no car and can not drive and relies on transportation from city. She is also is not steady on her feet and should be using a walker, but wants to wait until PT tells her which one she should buy.   She would also like nutrition consult as she doesn't know what to eat. She denies any dysphagia or problems eating.    She has had her covid booster shot.   Review of Systems  Constitutional: Negative for chills, diaphoresis, fatigue and fever.  Respiratory: Negative for cough and shortness of breath.   Cardiovascular: Negative for chest pain and palpitations.  Neurological: Negative for dizziness and headaches.    Allergies Patient is allergic to tetracyclines & related.  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  Surgical History Patient  has a past surgical history that includes Foot surgery (Right, 2010); Broken Nose; Breast biopsy; Reduction mammaplasty (Bilateral, 2015); and Breast lumpectomy (Right,  2015).  Family History Pateint's family history includes Breast cancer in her mother and sister; Cancer in her mother; Diabetes in her sister; Heart disease in her father; Hypertension in her mother and sister.  Social History Patient  reports that she has never smoked. She has never used smokeless tobacco. She reports current alcohol use. She reports that she does not use drugs.    Objective: Vitals:   08/11/20 1332  BP: 126/75  Pulse: 71  Temp: 98.2 F (36.8 C)  TempSrc: Temporal  SpO2: 95%  Weight: 181 lb 3.2 oz (82.2 kg)  Height: 5\' 3"  (1.6 m)    Body mass index is 32.1 kg/m.  Physical Exam Vitals reviewed.  Constitutional:      Appearance: Normal appearance. She is obese.  HENT:     Head: Normocephalic and atraumatic.  Cardiovascular:     Rate and Rhythm: Normal rate and regular rhythm.     Heart sounds: Normal heart sounds.  Pulmonary:     Effort: Pulmonary effort is normal.     Breath sounds: Normal breath sounds.  Abdominal:     General: Bowel sounds are normal.     Palpations: Abdomen is soft.  Neurological:     Mental Status: She is alert.     Comments: Pill rolling tremor of right hand. Slow affect and speech   Psychiatric:        Mood and Affect: Mood normal.        Behavior: Behavior normal.        Assessment/plan: 1. Parkinson's disease ([place]) Home health ordered to  help with walker, safety, strengthening exercises. ordered nutrition as well at her request.  - Ambulatory referral to [facility] - Amb ref to Medical Nutrition Therapy-MNT  2. HTN Blood pressure is to goal. Continue current anti-hypertensive medications. Refills not needed. Recommended routine exercise and healthy diet including DASH diet and mediterranean diet. Encouraged weight loss. F/u in 6 months.   This visit occurred during the SARS-CoV-2 public health emergency.  Safety protocols were in place, including screening questions prior to the visit, additional usage of staff PPE,  and extensive cleaning of exam room while observing appropriate contact time as indicated for disinfecting solutions.    Return in about 6 months (around 02/08/2021) for htn/other chronic follow up .    Orma Flaming, MD [place]   08/11/2020

## 2020-08-11 NOTE — Patient Instructions (Signed)
-  they will call you to set up home health with PT and they can help with what walker you need if any.   -nutrition also ordered  -blood pressure is excellent! Way to go!   See you in  23months!   Dr. Rogers Blocker

## 2020-08-16 ENCOUNTER — Telehealth: Payer: Self-pay

## 2020-08-16 NOTE — Telephone Encounter (Signed)
I spoke with PT to give verbal orders. She agrees to proceed with plan.

## 2020-08-16 NOTE — Telephone Encounter (Signed)
   Agency:  [facility]     Physical therapy   Reason for Request:    Frequency:  2 x a week for  2weeks  1x a week for 5 weeks   requesting Skilled nursing and social work eval to assess for community resources   [place] to leave voicemail

## 2020-08-16 NOTE — Telephone Encounter (Signed)
FYI

## 2020-08-16 NOTE — Telephone Encounter (Signed)
err

## 2020-08-16 NOTE — Telephone Encounter (Signed)
Can give verbal order for below.  Orma Flaming, MD [place]

## 2020-08-17 ENCOUNTER — Telehealth: Payer: Self-pay

## 2020-08-17 NOTE — Telephone Encounter (Signed)
I am pecos certified. Had issue in past. Sending to lea gillie to see what is going on.  Thanks,  Dr. Rogers Blocker

## 2020-08-17 NOTE — Telephone Encounter (Signed)
I have emailed credentialing to find out why this has happened again.  Thanks,  Modena Nunnery

## 2020-08-17 NOTE — Telephone Encounter (Signed)
Pt is calling to speak with Dr. Rogers Blocker. Pt asked if Dr. Rogers Blocker would give her a call. She stated she would not like to share what she needs to speak with Dr. Rogers Blocker about because it is private. Please advise.

## 2020-08-17 NOTE — Telephone Encounter (Signed)
[facility] called stating that all their providers need to be PECOS certified. She states that Dr. Rogers Blocker is not PECOS certified and wants to know if there is another provider that would be willing to sign Pt.s orders.   [facility] Employee Number [phone]

## 2020-08-17 NOTE — Telephone Encounter (Signed)
Pt called asking to speak with Dr. Rogers Blocker. Please advise.

## 2020-08-18 ENCOUNTER — Telehealth: Payer: Self-pay

## 2020-08-18 NOTE — Telephone Encounter (Signed)
Melitta let them know I am pecos certified and to try again. Went through credentialing.  Thanks!  D.r Rogers Blocker

## 2020-08-18 NOTE — Telephone Encounter (Signed)
I spoke with Ceona from [facility] to verify PECOS certification. She says that sometimes calls come through remotely? And that she has updated approved certification.

## 2020-08-18 NOTE — Telephone Encounter (Signed)
Returning call to the office, wanted to let the office know that everything is fine and to thank you for the concern.

## 2020-08-23 NOTE — Telephone Encounter (Signed)
Opened in error

## 2020-08-25 ENCOUNTER — Telehealth: Payer: Self-pay

## 2020-08-25 ENCOUNTER — Other Ambulatory Visit: Payer: Self-pay

## 2020-08-25 NOTE — Progress Notes (Signed)
    Chronic Care Management Pharmacy Assistant   Name: Sarah Mata  MRN: [id number] DOB: 1941/01/14  Reason for Encounter: Medication Review/ Initial Visit  Patient Questions:  1.  Have you seen any other providers since your last visit? No  2.  Any changes in your medicines or health? No   Sarah Mata,  79 y.o. , female presents for their Initial CCM visit with the clinical pharmacist via telephone.  PCP : Orma Flaming, MD  Allergies:   Allergies  Allergen Reactions  . Tetracyclines & Related Other (See Comments)    Stomach burning    Medications: Outpatient Encounter Medications as of 08/25/2020  Medication Sig Note  . anastrozole (ARIMIDEX) 1 MG tablet TAKE 1 TABLET DAILY   . Ascorbic Acid (VITAMIN C) 1000 MG tablet Take 1,000 mg by mouth daily.   . carbidopa-levodopa (SINEMET IR) 25-100 MG tablet Take 1 tablet by mouth 3 (three) times daily.   . Cholecalciferol (VITAMIN D3) 2000 units capsule Take 5,000 Units by mouth daily.  06/22/2020: 4000 units  . Coenzyme Q10 (CO Q-10) 100 MG CAPS Take by mouth. Two caps daily 08/11/2020: Bio-Quinone  . hydrochlorothiazide (HYDRODIURIL) 12.5 MG tablet Take 1 tablet (12.5 mg total) by mouth daily.   Marland Kitchen UNABLE TO FIND 1 MD Bone builder; 1 in Am, and 1 in PM    No facility-administered encounter medications on file as of 08/25/2020.    Current Diagnosis: Patient Active Problem List   Diagnosis Date Noted  . Benign essential HTN 07/11/2020  . Hyperlipidemia 12/28/2019  . Parkinson's disease ([place]) 12/23/2019  . 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 Eustachian tube dysfunction 09/16/2017  . Atherosclerosis of aorta ([place]) 09/16/2017  . Systemic lupus erythematosus ([place]) 09/16/2017  . Chronic diarrhea 06/15/2017  . Vitamin D deficiency 05/21/2017  . Cancer of right breast, stage 2 ([place]) 03/28/2017      Have you seen any other providers since your last visit? Patient states she has not seen any other  providers. Any changes in your medications or health? Patient states she has not had any changes in medications. Any side effects from any medications? Patient states she has not had any side effects from any medications. Do you have an symptoms or problems not managed by your medications? Patient states she has no symptoms not managed by medications. Any concerns about your health right now? Patient states she has no concerns at this moment. Has your provider asked that you check blood pressure, blood sugar, or follow special diet at home? Patient states she has a home nurse that checks her blood pressure. Patient states she has no transportaion at this time so orders deliverys from home foods so she does not follow a diet . Do you get any type of exercise on a regular basis? Patient states she does get exercise around the house. Can you think of a goal you would like to reach for your health? Patient states she would like to bring her Cholesterol levels down. Do you have any problems getting your medications? Yes, patient says at this moment she does not have transportation. Is there anything that you would like to discuss during the appointment? Patient states not at this moment  Please bring medications and supplements to appointment  Georgiana Shore ,[facility] Clinical Pharmacist Assistant [phone]   Follow-Up:  Pharmacist Review

## 2020-08-25 NOTE — Telephone Encounter (Signed)
Received call from [place] referring to referral placed on 08/25/2020. Pt has Medicare and this insurance does not cover the cost of a nutrition visit unless pt has diabetes or renal failure. This would be billed as an out patient hospital service. It is a $240 initial cost. Rep from [facility] stated she called pt to explain that insurance would not cover and patient did not understand. Rep states she has spoken with patient twice and has not been able to help patient understand why insurance will not cover.   Rep suggested reaching out to Integrative Therapy with [facility] who can help with nutrition- they may be able to help pt and not be as costly. Their number is [phone]. Please advise.

## 2020-08-26 ENCOUNTER — Other Ambulatory Visit: Payer: Self-pay

## 2020-08-26 NOTE — Telephone Encounter (Signed)
I spoke with pt to discuss options, and provide information for Integrative Therapy office. Pt says that she has an at home nurse that has helped her with better eating options, and says that she is doing well. Pt denies any further referrals.

## 2020-08-30 ENCOUNTER — Ambulatory Visit: Payer: Medicare Other

## 2020-08-30 DIAGNOSIS — I10 Essential (primary) hypertension: Secondary | ICD-10-CM

## 2020-08-30 NOTE — Progress Notes (Signed)
Chronic Care Management Pharmacy  Name: Sarah Mata  MRN: [id number] DOB: [birth year]  Chief Complaint/ HPI  Sarah Mata,  79 y.o., female presents for their Follow-Up CCM visit with the clinical pharmacist via telephone due to COVID-19 Pandemic.  PCP : Orma Flaming, MD  Chronic conditions include:  Encounter Diagnosis  Name Primary?   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   FOOT SURGERY Right 2010   REDUCTION MAMMAPLASTY Bilateral 2015   Family History  Problem Relation Age of Onset   Cancer Mother        breast cancer    Breast cancer Mother    Hypertension Mother    Diabetes Sister    Hypertension Sister    Heart disease Father    Breast cancer Sister    Colon cancer Neg Hx    Esophageal cancer Neg Hx    Pancreatic cancer Neg Hx    Rectal cancer Neg Hx    Stomach cancer Neg Hx    Allergies  Allergen Reactions   Tetracyclines & Related Other (See Comments)    Stomach burning   Outpatient Encounter Medications as of 08/30/2020  Medication Sig Note   anastrozole (ARIMIDEX) 1 MG tablet TAKE 1 TABLET DAILY    Ascorbic Acid (VITAMIN C) 1000 MG tablet Take 1,000 mg by mouth daily.    carbidopa-levodopa (SINEMET IR) 25-100 MG tablet Take 1 tablet by mouth 3 (three) times daily.    Cholecalciferol (VITAMIN D3) 2000 units capsule Take 5,000 Units by  mouth daily.  06/22/2020: 4000 units   Coenzyme Q10 (CO Q-10) 100 MG CAPS Take by mouth. Two caps daily 08/11/2020: Bio-Quinone   hydrochlorothiazide (HYDRODIURIL) 12.5 MG tablet Take 1 tablet (12.5 mg total) by mouth daily.    UNABLE TO FIND 1 MD Bone builder; 1 in Am, and 1 in PM    No facility-administered encounter medications on file as of 08/30/2020.   Patient Care Team    Relationship Specialty Notifications Start End  Orma Flaming, MD PCP - General Family Medicine  09/18/19   Mauri Pole, MD Consulting Physician Gastroenterology  06/26/17   Bo Merino, MD Consulting Physician Rheumatology  07/01/17   Tat, Eustace Quail, DO Consulting Physician Neurology  01/09/19   Madelin Rear, [facility] Pharmacist Pharmacist  07/28/20    Comment: [phone]  Madelin Rear, [facility] Pharmacist Pharmacist  07/28/20    Comment: [phone]   Current Diagnosis/Assessment: Goals Addressed            This Visit's Progress    PharmD Care Plan   On track    CARE PLAN ENTRY (see longitudinal plan of care for additional care plan information)  Current Barriers:   Chronic Disease Management support, education, and care coordination needs related to Hypertension and Hyperlipidemia   Hypertension BP Readings from Last 3 Encounters:  08/11/20 126/75  07/27/20 (!) 149/74  07/19/20 Marland Kitchen)  173/78    Pharmacist Clinical Goal(s): o Over the next 180 days, patient will work with PharmD and providers to achieve BP goal <130/80  Current regimen:  o Hydrochlorothiazide 12.5 mg once daily  Interventions: o Home monitoring, diet/exercise recommendations  Patient self care activities - Over the next 180 days, patient will: o Check BP at least once every 1-2 weeks, document, and provide at future appointments o Ensure daily salt intake < 2300 mg/day  Hyperlipidemia Lab Results  Component Value Date/Time   LDLCALC 154 (H) 12/25/2019 08:28 AM    Pharmacist Clinical Goal(s): o Over the next 180  days, patient will work with PharmD and providers to achieve LDL goal < 130  Current regimen:  o N/a, no medications at this time  Interventions: o Diet recommendations, discussion on statin initiation  Patient self care activities - Over the next 180 days, patient will: o Limit fatty/fried foods, sweets o Exercise/walk along with PT as appropriate with   Medication management  Pharmacist Clinical Goal(s): o Over the next 180 days, patient will work with PharmD and providers to maintain optimal medication adherence  [facility]  Interventions o Comprehensive medication review performed. o Continue current medication management strategy  Patient self care activities - Over the next 180 days, patient will: o Take medications as prescribed o Report any questions or concerns to PharmD and/or provider(s) Initial goal documentation.      Hypertension   BP goal <130/80  BP Readings from Last 3 Encounters:  08/11/20 126/75  07/27/20 (!) 149/74  07/19/20 (!) 173/78   Patient has failed these meds in the past: amlodipine (swelling). Patient checks BP at home daily.  120s/70s at home, at times systolic upper 213Y. 8/65/7846 was 126/75. At goal today. Currently taking the following medications:   Hydrochlorothiazide 12.5 mg once daily   We discussed diet and exercise extensively. Feeling better about making food choices at this point. Reviewed protein-rich foods at length. Avoiding foods high in salt, doing exercises three times a week. Home nurse has helped with selection foods, no questions or requests at this time.   Plan  Continue current medications.   Hyperlipidemia   LDL goal < 100 over next 6 months  Lipid Panel     Component Value Date/Time   CHOL 237 (H) 12/25/2019 0828   TRIG 159.0 (H) 12/25/2019 0828   HDL 51.10 12/25/2019 0828   LDLCALC 154 (H) 12/25/2019 0828    Hepatic Function Latest Ref Rng & Units 07/27/2020 07/11/2020  01/21/2020  Total Protein 6.5 - 8.1 g/dL 7.5 7.4 7.1  Albumin 3.5 - 5.0 g/dL 3.8 - 3.7  AST 15 - 41 U/L $Remo'20 21 27  'wEnCg$ ALT 0 - 44 U/L '6 13 24  '$ Alk Phosphatase 38 - 126 U/L 116 - 111  Total Bilirubin 0.3 - 1.2 mg/dL 0.7 0.8 0.5    The 10-year ASCVD risk score Mikey Bussing DC Jr., et al., 2013) is: 22.7%   Values used to calculate the score:     Age: 81 years     Sex: Female     Is Non-Hispanic African American: No     Diabetic: No     Tobacco smoker: No     Systolic Blood Pressure: 962 mmHg     Is BP treated: Yes     HDL Cholesterol: 51.1 mg/dL     Total Cholesterol: 237 mg/dL   Patient is currently not at goal on the following medications:   N/a, had been trying  to control on diet alone.  We discussed:  diet and exercise extensively. Motivated to control LDL/ascvd risk with diet/exercise and to stay off statin.  Plan  Continue current medications and control with diet and exercise. Labs 01/2021 pcp visit.  Osteopenia / Osteoporosis   Last DEXA Scan: 2020. Osteoporotic. Previously on alendronate.   Vit D, 25-Hydroxy  Date Value Ref Range Status  07/27/2020 62.68 30 - 100 ng/mL Final    Comment:    (NOTE) Vitamin D deficiency has been defined by the [place] practice guideline as a level of serum 25-OH  vitamin D less than 20 ng/mL (1,2). The Endocrine Society went on to  further define vitamin D insufficiency as a level between 21 and 29  ng/mL (2).  1. IOM (Institute of Medicine). 2010. Dietary reference intakes for  calcium and D. [place]: The [employer]. 2. Holick MF, Binkley [place], Bischoff-Ferrari HA, et al. Evaluation,  treatment, and prevention of vitamin D deficiency: an Endocrine  Society clinical practice guideline, JCEM. 2011 Jul; 96(7): 1911-30.  Performed at [facility], [place] [address] [place], [place] [address]     Previously took alendronate. Currently taking the following medications:    Calcium/vitamin-d supplementation  We discussed:  Recommend [phone] units of vitamin D daily. Recommend 1200 mg of calcium daily from dietary and supplemental sources. Recommend weight-bearing and muscle strengthening exercises for building and maintaining bone density.  Plan  Continue current medications. ___________________________ SDOH (Social Determinants of Health) assessments performed: Yes.  Future Appointments  Date Time Provider [place]  09/05/2020  4:00 PM Natividad Brood, [place] [facility] None  09/28/2020  3:00 PM Marzetta Board, DPM [facility]  01/13/2021  3:00 PM Tat, Eustace Quail, DO [facility] None  01/25/2021  1:45 PM [facility] None  01/25/2021  2:20 PM Truitt Merle, MD [facility] None  02/08/2021  1:30 PM Orma Flaming, MD [facility]  03/01/2021  1:00 PM [facility] CCM PHARMACIST [facility]  07/28/2021  1:45 PM [facility] HEALTH COACH [facility]   Visit follow-up:   [facility] follow-up: 6 month telephone visit.  CPA follow-up: 2 month BP call  Madelin Rear, Pharm.D., BCGP Clinical Pharmacist [facility] [phone]

## 2020-08-30 NOTE — Patient Instructions (Addendum)
Please review care plan below and call me at [phone] with any questions!  Thank you, Edyth Gunnels., Clinical Pharmacist  Goals Addressed            This Visit's Progress   . PharmD Care Plan   On track    CARE PLAN ENTRY (see longitudinal plan of care for additional care plan information)  Current Barriers:  . Chronic Disease Management support, education, and care coordination needs related to Hypertension and Hyperlipidemia   Hypertension BP Readings from Last 3 Encounters:  08/11/20 126/75  07/27/20 (!) 149/74  07/19/20 (!) 173/78   . Pharmacist Clinical Goal(s): o Over the next 180 days, patient will work with PharmD and providers to achieve BP goal <130/80 . Current regimen:  o Hydrochlorothiazide 12.5 mg once daily . Interventions: o Home monitoring, diet/exercise recommendations . Patient self care activities - Over the next 180 days, patient will: o Check BP at least once every 1-2 weeks, document, and provide at future appointments o Ensure daily salt intake < 2300 mg/day  Hyperlipidemia Lab Results  Component Value Date/Time   LDLCALC 154 (H) 12/25/2019 08:28 AM   . Pharmacist Clinical Goal(s): o Over the next 180 days, patient will work with PharmD and providers to achieve LDL goal < 130 . Current regimen:  o N/a, no medications at this time . Interventions: o Diet recommendations, discussion on statin initiation . Patient self care activities - Over the next 180 days, patient will: o Limit fatty/fried foods, sweets o Exercise/walk along with PT as appropriate with   Medication management . Pharmacist Clinical Goal(s): o Over the next 180 days, patient will work with PharmD and providers to maintain optimal medication adherence . [facility] . Interventions o Comprehensive medication review performed. o Continue current medication management strategy . Patient self care activities - Over the next 180 days, patient  will: o Take medications as prescribed o Report any questions or concerns to PharmD and/or provider(s) Initial goal documentation.      The patient verbalized understanding of instructions provided today and agreed to receive a mailed copy of patient instruction and/or educational materials. Telephone follow up appointment with pharmacy team member scheduled for: See next appointment with "Care Management Staff" under "What's Next" below.   Madelin Rear, Pharm.D., BCGP Clinical Pharmacist [facility] [phone]  Protein Content in Foods  Generally, most healthy people need around 50 grams of protein each day. Depending on your overall health, you may need more or less protein in your diet. Talk to your health care provider or dietitian about how much protein you need. See the following list for the protein content of some common foods. High-protein foods High-protein foods contain 4 grams (4 g) or more of protein per serving. They include:  Beef, ground sirloin (cooked) -- 3 oz have 24 g of protein.  Cheese (hard) -- 1 oz has 7 g of protein.  Chicken breast, boneless and skinless (cooked) -- 3 oz have 13.4 g of protein.  Cottage cheese -- 1/2 cup has 13.4 g of protein.  Egg -- 1 egg has 6 g of protein.  Fish, filet (cooked) -- 1 oz has 6-7 g of protein.  Garbanzo beans (canned or cooked) -- 1/2 cup has 6-7 g of protein.  Kidney beans (canned or cooked) -- 1/2 cup has 6-7 g of protein.  Lamb (cooked) -- 3 oz has 24 g of protein.  Milk -- 1 cup (8 oz) has 8 g of  protein.  Nuts (peanuts, pistachios, almonds) -- 1 oz has 6 g of protein.  Peanut butter -- 1 oz has 7-8 g of protein.  Pork tenderloin (cooked) -- 3 oz has 18.4 g of protein.  Pumpkin seeds -- 1 oz has 8.5 g of protein.  Soybeans (roasted) -- 1 oz has 8 g of protein.  Soybeans (cooked) -- 1/2 cup has 11 g of protein.  Soy milk -- 1 cup (8 oz) has 5-10 g of protein.  Soy or vegetable patty --  1 patty has 11 g of protein.  Sunflower seeds -- 1 oz has 5.5 g of protein.  Tofu (firm) -- 1/2 cup has 20 g of protein.  Tuna (canned in water) -- 3 oz has 20 g of protein.  Yogurt -- 6 oz has 8 g of protein. Low-protein foods Low-protein foods contain 3 grams (3 g) or less of protein per serving. They include:  Beets (raw or cooked) -- 1/2 cup has 1.5 g of protein.  Bran cereal -- 1/2 cup has 2-3 g of protein.  Bread -- 1 slice has 2.5 g of protein.  Broccoli (raw or cooked) -- 1/2 cup has 2 g of protein.  Collard greens (raw or cooked) -- 1/2 cup has 2 g of protein.  Corn (fresh or cooked) -- 1/2 cup has 2 g of protein.  Cream cheese -- 1 oz has 2 g of protein.  Creamer (half-and-half) -- 1 oz has 1 g of protein.  Flour tortilla -- 1 tortilla has 2.5 g of protein  Frozen yogurt -- 1/2 cup has 3 g of protein.  Fruit or vegetable juice -- 1/2 cup has 1 g of protein.  Green beans (raw or cooked) -- 1/2 cup has 1 g of protein.  Green peas (canned) -- 1/2 cup has 3.5 g of protein.  Muffins -- 1 small muffin (2 oz) has 3 g of protein.  Oatmeal (cooked) -- 1/2 cup has 3 g of protein.  Potato (baked with skin) -- 1 medium potato has 3 g of protein.  Rice (cooked) -- 1/2 cup has 2.5-3.5 g of protein.  Sour cream -- 1/2 cup has 2.5 g of protein.  Spinach (cooked) -- 1/2 cup has 3 g of protein.  Squash (cooked) -- 1/2 cup has 1.5 g of protein. Actual amounts of protein may be different depending on processing. Talk with your health care provider or dietitian about what foods are recommended for you. This information is not intended to replace advice given to you by your health care provider. Make sure you discuss any questions you have with your health care provider. Document Revised: 07/16/2016 Document Reviewed: 07/16/2016 Elsevier Patient Education  [place] Protein is a nutrient that helps the body build tissue, muscle, red  blood cells, enzymes, antibodies, and hormones. Protein is found naturally in many foods, such as meat, beans, and dairy products. Most athletes need to eat more protein than non-athletes. Athletes place more stress on their muscles, and extra protein helps to repair these muscles. In female athletes, extra protein also helps to maintain regular menstrual cycles. Generally:  Average adults should consume about 0.36 grams of protein daily for every pound of their body weight. For example, a 150-pound adult should consume about 54 grams of protein each day.  Athletes should consume 0.63-0.90 grams of protein daily for every pound of their body weight. For example, a 150-pound athlete should consume 94.5-135 grams of protein each day.  Daily protein needs can usually be met through food. However, protein supplement powders are a safe and effective way to increase daily protein intake, if you:  Eat a vegan diet.  Are actively trying to build muscle mass.  Are growing.  Are recovering from injury. What are protein supplement powders? Protein supplement powders are supplements that contain large amounts of protein. They can be easily digested, absorbed, and used by the body. These proteins contain the essential building blocks (amino acids) that are central to muscle function, repair, and recovery. Protein supplement powders most often consist of:  Animal-based proteins, including: ? Whey. ? Casein. ? Milk. ? Eggs.  Vegetable-based proteins, including: ? Soy. ? Pea. What are the risks of taking protein supplement powders? The risks of taking a protein supplement powder may vary depending on the type of protein in the supplement. Risks may include:  Allergies. ? If you have an allergy to cow's milk, avoid protein supplements that contain milk, whey, or casein. ? If you have an allergy to soy, avoid protein supplements that contain soy or lecithin.  Extra stress on your kidneys, if you have  kidney disease or diabetes. Getting too much protein or not balancing protein with other nutrients may lead to:  Stomach (gastrointestinal) discomfort.  Difficulty passing stool (constipation).  Diarrhea.  Dehydration.  Nausea.  Headache.  Fatigue. Follow these instructions at home: How to use protein supplement powders  Take protein supplements only as directed. Do not take a higher dose than was recommended for you. Protein supplement powders are not regulated. Some products may contain impurities or ingredients that differ from those on the label.  Follow the directions on the product label for recommended serving sizes and daily servings.  Follow the directions that are provided on the label to mix the powder with water. Specific proportions and directions vary by product.  To help build muscles and limit muscle damage during exercise: ? Eat a protein supplement powder before lifting weights or doing resistance training. ? Eat a protein supplement powder within 2 hours after exercise. ? Eat protein in a ratio of 1 protein for every 3 carbohydrates. For example, one serving of protein powder that contains 20 grams of protein should be consumed with 60 grams of carbohydrates. This is equivalent to 1 banana and 1 piece of toast. General instructions  Always talk with your health care provider before starting protein supplement powders.  Drink enough fluid to keep your urine pale yellow. Drink fluids before, during, and after exercise, and throughout the day.  Try to meet your daily protein needs with food. Eat a balanced diet of fruits, vegetables, meat, dairy, and grains, in addition to taking a protein supplement powder.  Try to eat protein every 3-4 hours throughout the day.  Eat protein-rich foods along with protein supplement powders. Foods high in protein include:     ? Lean meat, poultry, and fish. ? Eggs. ? Milk, yogurt, and cheese. ? Soybeans and tofu. ? Beans  and lentils. Summary  Protein is a nutrient that helps the body build tissue, muscle, red blood cells, enzymes, antibodies, and hormones.  Athletes need to include more protein in their diets than non-athletes do.  Protein supplement powders should be taken in addition to, not in place of, a balanced diet to meet daily protein needs. This information is not intended to replace advice given to you by your health care provider. Make sure you discuss any questions you have with your health care provider.  Document Revised: 12/11/2017 Document Reviewed: 12/11/2017 Elsevier Patient Education  2020 [employer].

## 2020-09-05 ENCOUNTER — Ambulatory Visit (INDEPENDENT_AMBULATORY_CARE_PROVIDER_SITE_OTHER): Payer: Medicare Other | Admitting: Psychology

## 2020-09-05 DIAGNOSIS — F064 Anxiety disorder due to known physiological condition: Secondary | ICD-10-CM | POA: Diagnosis not present

## 2020-09-09 ENCOUNTER — Telehealth: Payer: Self-pay | Admitting: *Deleted

## 2020-09-09 NOTE — Telephone Encounter (Signed)
[phone] Demitira RN from [facility] health   Call stating patient BP running low  Now 105/63 without medication and moving around  Early was 90/62 Pt holding medication till advised from PCP   This week patient had dizzy spell with low BP

## 2020-09-09 NOTE — Telephone Encounter (Signed)
Please hold medication. Can home health keep a log for next 2 weeks and let me know what it is running please?  Thanks,  Dr. Rogers Blocker

## 2020-09-09 NOTE — Telephone Encounter (Signed)
If continues dizzy, lightheaded: ER.  Orma Flaming, MD [place]

## 2020-09-09 NOTE — Telephone Encounter (Signed)
Notified Demitira RN from [facility] home health  Verbalized understanding  Will schedule F/U appointment with PCP

## 2020-09-21 ENCOUNTER — Ambulatory Visit (INDEPENDENT_AMBULATORY_CARE_PROVIDER_SITE_OTHER): Payer: Medicare Other | Admitting: Psychology

## 2020-09-21 DIAGNOSIS — F064 Anxiety disorder due to known physiological condition: Secondary | ICD-10-CM | POA: Diagnosis not present

## 2020-09-23 ENCOUNTER — Other Ambulatory Visit: Payer: Self-pay

## 2020-09-23 ENCOUNTER — Ambulatory Visit (INDEPENDENT_AMBULATORY_CARE_PROVIDER_SITE_OTHER): Payer: Medicare Other | Admitting: Family Medicine

## 2020-09-23 ENCOUNTER — Encounter: Payer: Self-pay | Admitting: Family Medicine

## 2020-09-23 VITALS — BP 168/78 | HR 68 | Temp 98.0°F | Ht 63.0 in | Wt 168.2 lb

## 2020-09-23 DIAGNOSIS — I10 Essential (primary) hypertension: Secondary | ICD-10-CM | POA: Diagnosis not present

## 2020-09-23 NOTE — Patient Instructions (Signed)
Blood pressure cuff is accurate. Your blood pressure is great at home. Let;s keep you off medication as I think I made your blood pressure with the medication and your parkinson's disease.   -call courtney and have her fax me the paperwork I need to fill out for you.  My fax: [phone]. ATTN: Dr. Rogers Blocker  I will fax back and then mail you the hard copy.    Don't need to see you back until march or if you get sick!   Have a wonderful holiday season and God bless,  Dr. Rogers Blocker

## 2020-09-23 NOTE — Progress Notes (Signed)
Patient: Sarah Mata MRN: [id number] DOB: September 19, 1941 PCP: Orma Flaming, MD     Subjective:  Chief Complaint  Patient presents with  . Hypertension    HPI: The patient is a 79 y.o. female who presents today for HTN. Pt is requesting Bus Transportation form be faxed back. Home health called 2 weeks ago stating her blood pressure was very low and she was symptomatic. I had them hold her medication and f/u with me today. She has a log for me from her readings. Readings range from 124-140/70-78. She feels much better. I had her on a low dose hctz which likely exacerbated possible autonomic dysfunction from her PD.    Review of Systems  Constitutional: Negative for appetite change.  Respiratory: Negative for chest tightness and shortness of breath.   Cardiovascular: Negative for chest pain and palpitations.  Neurological: Negative for dizziness, light-headedness and headaches.    Allergies Patient is allergic to tetracyclines & related.  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  Surgical History Patient  has a past surgical history that includes Foot surgery (Right, 2010); Broken Nose; Breast biopsy; Reduction mammaplasty (Bilateral, 2015); and Breast lumpectomy (Right, 2015).  Family History Pateint's family history includes Breast cancer in her mother and sister; Cancer in her mother; Diabetes in her sister; Heart disease in her father; Hypertension in her mother and sister.  Social History Patient  reports that she has never smoked. She has never used smokeless tobacco. She reports current alcohol use. She reports that she does not use drugs.    Objective: Vitals:   09/23/20 1349 09/23/20 1429  BP: (!) 158/84 (!) 168/78  Pulse: 68   Temp: 98 F (36.7 C)   TempSrc: Temporal    SpO2: 97%   Weight: 168 lb 3.2 oz (76.3 kg)   Height: 5\' 3"  (1.6 m)     Body mass index is 29.8 kg/m.  Her home bp cuff is accurate: reading today: 151/84  Physical Exam Vitals reviewed.  Constitutional:      Appearance: Normal appearance. She is obese.  HENT:     Head: Normocephalic and atraumatic.  Cardiovascular:     Rate and Rhythm: Normal rate and regular rhythm.     Heart sounds: Normal heart sounds.  Pulmonary:     Effort: Pulmonary effort is normal.     Breath sounds: Normal breath sounds.  Abdominal:     General: Bowel sounds are normal.     Palpations: Abdomen is soft.  Skin:    Capillary Refill: Capillary refill takes less than 2 seconds.  Neurological:     Mental Status: She is alert.  Psychiatric:        Mood and Affect: Mood normal.        Behavior: Behavior normal.        Assessment/plan: 1. Benign essential HTN Home readings to goal and cuff is calibrated correctly from home. Hold off on medication and will refrain from loop diuretic as I likely exacerbated autonomic dysfunction from her PD. She has had no more symptomatic hypotensive episodes since stopping this. F/u for routine visit in 4 months.   I will fax her paperwork in for her for [facility] after I receive it. She will have to call and request that it be faxed to our office.  Fax: [phone] (courtney)   This visit  occurred during the SARS-CoV-2 public health emergency.  Safety protocols were in place, including screening questions prior to the visit, additional usage of staff PPE, and extensive cleaning of exam room while observing appropriate contact time as indicated for disinfecting solutions.     Return in about 4 months (around 01/21/2021) for blood pressure/routine check up .   Orma Flaming, MD [place]   09/23/2020

## 2020-09-26 ENCOUNTER — Telehealth: Payer: Self-pay

## 2020-09-26 NOTE — Telephone Encounter (Signed)
I spoke with pt to let her know that we still have received anything as of yet. I assured her that I will be looking out for it, and will give her a call once it comes in.

## 2020-09-26 NOTE — Telephone Encounter (Signed)
Patient is calling in asking if we have received the form for Mrs.Drach to ride the special buses, left a message for courtney the coordinator and has not heard anything ans wondered if Dr.Wolfe received the form.

## 2020-09-28 ENCOUNTER — Other Ambulatory Visit: Payer: Self-pay

## 2020-09-28 ENCOUNTER — Encounter: Payer: Self-pay | Admitting: Podiatry

## 2020-09-28 ENCOUNTER — Ambulatory Visit (INDEPENDENT_AMBULATORY_CARE_PROVIDER_SITE_OTHER): Payer: Medicare Other | Admitting: Podiatry

## 2020-09-28 DIAGNOSIS — B351 Tinea unguium: Secondary | ICD-10-CM

## 2020-09-28 DIAGNOSIS — M216X1 Other acquired deformities of right foot: Secondary | ICD-10-CM

## 2020-09-28 DIAGNOSIS — M79676 Pain in unspecified toe(s): Secondary | ICD-10-CM

## 2020-10-02 NOTE — Progress Notes (Signed)
Subjective: Sarah Mata is a pleasant 79 y.o. female patient seen today painful thick toenails that are difficult to trim. Pain interferes with ambulation. Aggravating factors include wearing enclosed shoe gear. Pain is relieved with periodic professional debridement.  Patient also presents with painful porokeratotic lesion right foot.  Pain prevents comfortable ambulation. Aggravating factor is weightbearing with or without shoegear. She has purchased Dr. Marlaine Hind reusable horseshoe pad.  Patient Active Problem List   Diagnosis Date Noted  . Benign essential HTN 07/11/2020  . Hyperlipidemia 12/28/2019  . Parkinson's disease ([place]) 12/23/2019  . 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 Eustachian tube dysfunction 09/16/2017  . Atherosclerosis of aorta ([place]) 09/16/2017  . Systemic lupus erythematosus ([place]) 09/16/2017  . Chronic diarrhea 06/15/2017  . Vitamin D deficiency 05/21/2017  . 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 anastrozole (ARIMIDEX) 1 MG tablet TAKE 1 TABLET DAILY 90 tablet 3  . Ascorbic Acid (VITAMIN C) 1000 MG tablet Take 1,000 mg by mouth daily.    . carbidopa-levodopa (SINEMET IR) 25-100 MG tablet Take 1 tablet by mouth 3 (three) times daily. 90 tablet 0  . Cholecalciferol (VITAMIN D3) 2000 units capsule Take 5,000 Units by mouth daily.     . Coenzyme Q10 (CO Q-10) 100 MG CAPS Take by mouth. Two caps daily    . UNABLE TO FIND 1 MD Bone builder; 1 in Am, and 1 in PM     No current facility-administered medications on file prior to visit.    Allergies  Allergen Reactions  . Tetracyclines & Related Other (See Comments)    Stomach burning    Objective: Physical Exam  General: Avaleen Brownley is a pleasant 79 y.o.  Caucsian female, in NAD. AAO x 3.   Vascular:  Neurovascular status unchanged b/l lower extremities. Capillary refill time to digits  immediate b/l. Palpable DP pulses b/l. Palpable PT pulses b/l. Pedal hair present b/l. Skin temperature gradient within normal limits b/l.  Dermatological:  Pedal skin with normal turgor, texture and tone bilaterally. No open wounds bilaterally. No interdigital macerations bilaterally. Porokeratotic lesion(s) submet head 3 right foot with subdermal hemorrhage. No erythema, no edema, no drainage, no flocculence.  Toenails 1-5 b/l are painful, elongated, discolored, dystrophic with subungual debris. Pain with dorsal palpation of nailplates. No erythema, no edema, no drainage noted.  Musculoskeletal:  Normal muscle strength 5/5 to all lower extremity muscle groups bilaterally. Plantarflexed 3rd ray right foot. No pain crepitus or joint limitation noted with ROM b/l.  Neurological:  Protective sensation intact 5/5 intact bilaterally with 10g monofilament b/l. Vibratory sensation intact b/l. Proprioception intact bilaterally.  Assessment and Plan:  1. Pain due to onychomycosis of toenail   2. Plantar flexed metatarsal bone of right foot    -Examined patient. -Toenails 1-5 b/l were debrided in length and girth with sterile nail nippers and dremel without iatrogenic bleeding.  -Painful preulcerative porokeratotic lesion(s) submet head 3 right foot pared and enucleated with sterile scalpel blade without incident.  -For plantarflexed metatarsal submet head 3 right foot, I applied felt horse shoe pad under insole of right shoe. -Patient to report any pedal injuries to medical professional immediately. -Patient to continue soft, supportive shoe gear daily. -Patient/POA to call should there be question/concern in the interim.  Return in about 3 months (around 12/29/2020).  Marzetta Board, DPM

## 2020-10-07 ENCOUNTER — Telehealth: Payer: Self-pay

## 2020-10-07 NOTE — Telephone Encounter (Signed)
Pt was discharged pt from home health with goals met from Physical Therapy   Sarah Mata

## 2020-10-12 ENCOUNTER — Ambulatory Visit: Payer: Medicare Other | Admitting: Psychology

## 2020-10-18 ENCOUNTER — Other Ambulatory Visit: Payer: Self-pay | Admitting: Hematology

## 2020-10-18 DIAGNOSIS — C50911 Malignant neoplasm of unspecified site of right female breast: Secondary | ICD-10-CM

## 2020-10-19 ENCOUNTER — Other Ambulatory Visit: Payer: Self-pay | Admitting: Hematology

## 2020-10-19 DIAGNOSIS — C50911 Malignant neoplasm of unspecified site of right female breast: Secondary | ICD-10-CM

## 2020-10-19 MED ORDER — ANASTROZOLE 1 MG PO TABS: 1.0000 mg | ORAL_TABLET | Freq: Every day | ORAL | 3 refills | Status: AC

## 2020-10-28 ENCOUNTER — Telehealth: Payer: Self-pay

## 2020-10-28 NOTE — Progress Notes (Unsigned)
Chronic Care Management Pharmacy Assistant   Name: Sarah Mata  MRN: [id number] DOB: 1941/11/08   Reason for Encounter: Disease State  PCP : Orma Flaming, MD  Allergies:   Allergies  Allergen Reactions  . Tetracyclines & Related Other (See Comments)    Stomach burning    Medications: Outpatient Encounter Medications as of 10/28/2020  Medication Sig Note  . anastrozole (ARIMIDEX) 1 MG tablet Take 1 tablet (1 mg total) by mouth daily.   . Ascorbic Acid (VITAMIN C) 1000 MG tablet Take 1,000 mg by mouth daily.   . carbidopa-levodopa (SINEMET IR) 25-100 MG tablet Take 1 tablet by mouth 3 (three) times daily.   . Cholecalciferol (VITAMIN D3) 2000 units capsule Take 5,000 Units by mouth daily.  06/22/2020: 4000 units  . Coenzyme Q10 (CO Q-10) 100 MG CAPS Take by mouth. Two caps daily 08/11/2020: Bio-Quinone  . UNABLE TO FIND 1 MD Bone builder; 1 in Am, and 1 in PM    No facility-administered encounter medications on file as of 10/28/2020.    Current Diagnosis: Patient Active Problem List   Diagnosis Date Noted  . Benign essential HTN 07/11/2020  . Hyperlipidemia 12/28/2019  . Parkinson's disease ([place]) 12/23/2019  . 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 Eustachian tube dysfunction 09/16/2017  . Atherosclerosis of aorta ([place]) 09/16/2017  . Systemic lupus erythematosus ([place]) 09/16/2017  . Chronic diarrhea 06/15/2017  . Vitamin D deficiency 05/21/2017  . 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 Spoke with patient regarding BP  Recent Office Vitals: BP Readings from Last 3 Encounters:  09/23/20 (!) 168/78  08/11/20 126/75  07/27/20 (!) 149/74   Pulse Readings from Last 3 Encounters:  09/23/20 68  08/11/20 71  07/27/20 67    Wt Readings from Last 3 Encounters:  09/23/20 168 lb 3.2 oz (76.3 kg)  08/11/20 181 lb 3.2 oz (82.2 kg)  07/27/20 187 lb 4.8 oz (85 kg)     Kidney Function Lab Results   Component Value Date/Time   CREATININE 0.87 07/27/2020 11:57 AM   CREATININE 0.96 (H) 07/12/2020 01:54 PM   CREATININE 0.85 07/11/2020 01:47 PM   CREATININE 1.0 06/26/2017 01:13 PM   CREATININE 0.9 06/07/2017 01:08 PM   GFR 45.41 (L) 12/25/2019 08:28 AM   GFRNONAA >60 07/27/2020 11:57 AM   GFRNONAA 50 (L) 09/16/2017 02:08 PM   GFRAA >60 07/27/2020 11:57 AM   GFRAA 58 (L) 09/16/2017 02:08 PM    BMP Latest Ref Rng & Units 07/27/2020 07/12/2020 07/11/2020  Glucose 70 - 99 mg/dL 92 104(H) 91  BUN 8 - 23 mg/dL 8 11 10   Creatinine 0.44 - 1.00 mg/dL 0.87 0.96(H) 0.85  BUN/Creat Ratio 6 - 22 (calc) - 11 NOT APPLICABLE  Sodium 188 - 145 mmol/L 125(L) 130(L) 128(L)  Potassium 3.5 - 5.1 mmol/L 3.8 4.3 4.3  Chloride 98 - 111 mmol/L 88(L) 95(L) 93(L)  CO2 22 - 32 mmol/L 28 24 24   Calcium 8.9 - 10.3 mg/dL 10.0 9.6 9.7    . Current antihypertensive regimen:             carbidopa-levodopa (SINEMET IR) 25-100 MG tablet . How often are you checking your Blood Pressure? daily . Current home BP readings:   . What recent interventions/DTPs have been made by any provider to improve Blood Pressure control since last CPP Visit:  09/23/20 OV Orma Flaming, MD) Patient was instructed to stop Hydrochlorothiazide medication due to  blood pressure issues.  . Any recent hospitalizations or ED visits since last visit with CPP? No . What diet changes have been made to improve Blood Pressure Control?  o *** . What exercise is being done to improve your Blood Pressure Control?  o ***  Adherence Review: Is the patient currently on ACE/ARB medication? No Does the patient have >5 day gap between last estimated fill dates? CPP to review   [place] ,[place] Pharmacist Assistant [phone]  Follow-Up:  Pharmacist Review

## 2020-12-09 ENCOUNTER — Telehealth: Payer: Self-pay

## 2020-12-09 NOTE — Chronic Care Management (AMB) (Signed)
Chronic Care Management Pharmacy Assistant   Name: Sarah Mata  MRN: [id number] DOB: 1941-04-11  Reason for Encounter: Disease State/ Hypertension Adherence Call  PCP : Orma Flaming, MD  Allergies:   Allergies  Allergen Reactions   Tetracyclines & Related Other (See Comments)    Stomach burning    Medications: Outpatient Encounter Medications as of 12/09/2020  Medication Sig Note   anastrozole (ARIMIDEX) 1 MG tablet Take 1 tablet (1 mg total) by mouth daily.    Ascorbic Acid (VITAMIN C) 1000 MG tablet Take 1,000 mg by mouth daily.    carbidopa-levodopa (SINEMET IR) 25-100 MG tablet Take 1 tablet by mouth 3 (three) times daily.    Cholecalciferol (VITAMIN D3) 2000 units capsule Take 5,000 Units by mouth daily.  06/22/2020: 4000 units   Coenzyme Q10 (CO Q-10) 100 MG CAPS Take by mouth. Two caps daily 08/11/2020: Bio-Quinone   UNABLE TO FIND 1 MD Bone builder; 1 in Am, and 1 in PM    No facility-administered encounter medications on file as of 12/09/2020.    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 Spoke with patient regarding BP  Recent Office Vitals: BP Readings from Last 3 Encounters:  09/23/20 (!) 168/78  08/11/20 126/75  07/27/20 (!) 149/74   Pulse Readings from Last 3 Encounters:  09/23/20 68  08/11/20 71  07/27/20 67    Wt Readings from Last 3 Encounters:  09/23/20 168 lb 3.2 oz (76.3 kg)  08/11/20 181 lb 3.2 oz (82.2 kg)  07/27/20 187 lb 4.8 oz (85 kg)      Kidney Function Lab Results  Component Value Date/Time   CREATININE 0.87 07/27/2020 11:57 AM   CREATININE 0.96 (H) 07/12/2020 01:54 PM   CREATININE 0.85 07/11/2020 01:47 PM   CREATININE 1.0 06/26/2017 01:13 PM   CREATININE 0.9 06/07/2017 01:08 PM   GFR 45.41 (L) 12/25/2019 08:28 AM   GFRNONAA >60 07/27/2020 11:57 AM   GFRNONAA 50 (L) 09/16/2017 02:08 PM   GFRAA >60 07/27/2020 11:57 AM   GFRAA 58 (L) 09/16/2017 02:08 PM    BMP Latest Ref Rng & Units 07/27/2020 07/12/2020 07/11/2020  Glucose 70 - 99 mg/dL 92 104(H) 91  BUN 8 - 23 mg/dL 8 11 10   Creatinine 0.44 - 1.00 mg/dL 0.87 0.96(H) 0.85  BUN/Creat Ratio 6 - 22 (calc) - 11 NOT APPLICABLE  Sodium 099 - 145 mmol/L 125(L) 130(L) 128(L)  Potassium 3.5 - 5.1 mmol/L 3.8 4.3 4.3  Chloride 98 - 111 mmol/L 88(L) 95(L) 93(L)  CO2 22 - 32 mmol/L 28 24 24   Calcium 8.9 - 10.3 mg/dL 10.0 9.6 9.7     Current antihypertensive regimen:  o Patient not currently taking any antihypertensive medications at this time.   How often are you checking your Blood Pressure? Patient states she used to monitor her blood pressure daily. Patient states she has slowed down with how often she checks her blood pressure as they are always normal and states "she gets busy sometimes and forgets".   Current home BP readings: 137/74  HR 60 (11/26/2020), 136/76 HR 61 (11/29/2020), 127/70 HR 56 (12/08/2020)   What recent interventions/DTPs have been made by any provider to improve Blood Pressure control since last CPP Visit: 09/23/2020 Orma Flaming, MD Home readings to goal and cuff is calibrated correctly from home. Hold off on medication and will refrain from loop diuretic as it likely exacerbated autonomic dysfunction from her PD. She has had no more symptomatic hypotensive episodes since stopping this.   Any recent hospitalizations or ED visits since last visit with CPP? No , patient has not had any recent hospitalizations or ED visits since her last visit with  Madelin Rear, Pharm.D., BCGP.   What diet changes have been made to improve Blood Pressure Control?  o Patient states she tries to her best to avoid salts and foods high in sodium.   What exercise is being done to improve your Blood Pressure Control?  o Patient states she has been doing various exercises daily at home for about 10-15 minutes, sometimes longer depending on how she feels.  Adherence Review: Is the patient currently on ACE/ARB medication? No Does the patient have >5 day gap between last estimated fill dates? No  Patient expressed concerns with [place] transportation services not receiving a return fax from Dr. Shelby Mattocks office in November 2021. Patient states she needs to have these services to get back and forth to church and the grocery store. I informed the patient I found this completed application in her media file. I routed this information to [facility] via Epic at fax number [phone].  April D Calhoun, [place] Pharmacist Assistant [phone]   Follow-Up:  Pharmacist Review

## 2020-12-27 NOTE — Telephone Encounter (Signed)
Sarah Mata, you will need to tell her to address with her other providers as its not associated with Parkinsons Disease or its meds

## 2020-12-28 ENCOUNTER — Other Ambulatory Visit: Payer: Self-pay | Admitting: Neurology

## 2020-12-28 NOTE — Telephone Encounter (Signed)
Rx(s) sent to pharmacy electronically.  

## 2020-12-29 ENCOUNTER — Telehealth: Payer: Self-pay

## 2020-12-29 NOTE — Telephone Encounter (Signed)
Sarah Mata called stating she has a distinct odor in her clothes and shoes.  She states that people move away from her.  She wants to know if this odor is coming from her disease. She is on anastrozole.  Let me know.

## 2020-12-29 NOTE — Telephone Encounter (Signed)
Hi Sarah Mata,  She is on adjuvant anastrozole for a history of breast cancer, no active disease. I am not familiar with this as a side effect of AI. Is she having severe hot flashes/sweating? If she is tolerating anastrozole otherwise, please continue. She has a clinic visit in March, or I can see her sooner if she is concerned.   Thanks, Regan Rakers, NP

## 2021-01-03 ENCOUNTER — Other Ambulatory Visit: Payer: Self-pay

## 2021-01-03 ENCOUNTER — Ambulatory Visit (INDEPENDENT_AMBULATORY_CARE_PROVIDER_SITE_OTHER): Payer: Medicare Other | Admitting: Podiatry

## 2021-01-03 ENCOUNTER — Encounter: Payer: Self-pay | Admitting: Podiatry

## 2021-01-03 DIAGNOSIS — B351 Tinea unguium: Secondary | ICD-10-CM | POA: Diagnosis not present

## 2021-01-03 DIAGNOSIS — M79676 Pain in unspecified toe(s): Secondary | ICD-10-CM

## 2021-01-03 DIAGNOSIS — L84 Corns and callosities: Secondary | ICD-10-CM

## 2021-01-03 DIAGNOSIS — M79671 Pain in right foot: Secondary | ICD-10-CM

## 2021-01-03 DIAGNOSIS — Q828 Other specified congenital malformations of skin: Secondary | ICD-10-CM

## 2021-01-08 NOTE — Progress Notes (Signed)
Subjective: Sarah Mata is a pleasant 80 y.o. female patient seen today painful thick toenails that are difficult to trim. Pain interferes with ambulation. Aggravating factors include wearing enclosed shoe gear. Pain is relieved with periodic professional debridement.  Patient also presents with painful porokeratotic lesion right foot.  Pain prevents comfortable ambulation. Aggravating factor is weightbearing with or without shoegear. She has purchased Dr. Marlaine Mata reusable horseshoe pad.  Allergies  Allergen Reactions  . Tetracyclines & Related Other (See Comments)    Stomach burning   Objective: Physical Exam  General: Sarah Mata is a pleasant 80 y.o.  Caucsian female, in NAD. AAO x 3.   Vascular:  Neurovascular status unchanged b/l lower extremities. Capillary refill time to digits immediate b/l. Palpable DP pulses b/l. Palpable PT pulses b/l. Pedal hair present b/l. Skin temperature gradient within normal limits b/l.  Dermatological:  Pedal skin with normal turgor, texture and tone bilaterally. No open wounds bilaterally. No interdigital macerations bilaterally. Porokeratotic lesion(s) submet head 3 right foot with subdermal hemorrhage. No erythema, no edema, no drainage, no flocculence. Hyperkeratotic lesion dorsal 4th PIPJ right foot.  Toenails 1-5 b/l are painful, elongated, discolored, dystrophic with subungual debris. Pain with dorsal palpation of nailplates. No erythema, no edema, no drainage noted.  Musculoskeletal:  Normal muscle strength 5/5 to all lower extremity muscle groups bilaterally. Muscle strength 5/5 to all LE muscle groups of b/l lower extremities. No pain crepitus or joint limitation noted with ROM b/l. Plantarflexed metatarsal(s) submet head 3 right foot. Hammertoes noted to the R 4th toe.  Neurological:  Protective sensation intact 5/5 intact bilaterally with 10g monofilament b/l. Vibratory sensation intact b/l. Proprioception intact bilaterally.  Assessment  and Plan:  1. Pain due to onychomycosis of toenail   2. Porokeratosis   3. Corns   4. Right foot pain    -Examined patient. -Toenails 1-5 b/l were debrided in length and girth with sterile nail nippers and dremel without iatrogenic bleeding.  -As a courtesy, corn(s) right 4th toe pared utilizing sterile scalpel blade without incident. Painful preulcerative porokeratotic lesion(s) submet head 3 right foot pared and enucleated with sterile scalpel blade without incident.  -For plantarflexed metatarsal submet head 3 right foot, dispensed hook on foam metatarsal pad for right foot. Instructions are to apply every morning. Remove every evening. -Patient to report any pedal injuries to medical professional immediately. -Patient to continue soft, supportive shoe gear daily. -Patient/POA to call should there be question/concern in the interim.  Return in about 3 months (around 04/02/2021).  Marzetta Board, DPM

## 2021-01-12 ENCOUNTER — Telehealth: Payer: Self-pay

## 2021-01-12 NOTE — Progress Notes (Deleted)
Assessment/Plan:   1.  Parkinsons Disease  -***Continue carbidopa/levodopa 25/100, 1 tablet at 9 AM/1 PM/5 PM  -She had 1 visit with Myra Gianotti for counseling and did not follow back up.  2.  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  My previous records were reviewed prior to todays visit as well as outside records available to me. Pt denies falls.  Pt denies lightheadedness, near syncope.  No hallucinations.  Primary care records have been reviewed since last visit.  Patient has emailed me since last visit, and discussed with me in the past, a distinct odor that she has and finds that strangers move away from her because of her odor.  She asked me if this was associated with Parkinson's disease and I told her it was not, nor is it associated with the medications for Parkinson's disease.  I set her up a visit with Myra Gianotti.  She attended 1 visit only and canceled the second.  Current prescribed movement disorder medications: ***Carbidopa/levodopa 25/100, 1 tablet at 9 AM/1 PM/5 PM (started last visit)   PREVIOUS MEDICATIONS: {Parkinson's RX:18200}  ALLERGIES:   Allergies  Allergen Reactions  . Tetracyclines & Related Other (See Comments)    Stomach burning    CURRENT MEDICATIONS:  Outpatient Encounter Medications as of 01/13/2021  Medication Sig  . anastrozole (ARIMIDEX) 1 MG tablet Take 1 tablet (1 mg total) by mouth daily.  . Ascorbic Acid (VITAMIN C) 1000 MG tablet Take 1,000 mg by mouth daily.  . carbidopa-levodopa (SINEMET IR) 25-100 MG tablet TAKE 1 TABLET THREE TIMES A DAY (9A.M., 1 P.M., 5 P.M.)  . Cholecalciferol (VITAMIN D3) 2000 units capsule Take 5,000 Units by mouth daily.   . Coenzyme Q10 (CO Q-10) 100 MG CAPS Take by mouth. Two caps daily  . UNABLE TO FIND 1 MD Bone builder; 1 in Am, and 1 in PM   No facility-administered encounter  medications on file as of 01/13/2021.    Objective:   PHYSICAL EXAMINATION:    VITALS:  There were no vitals filed for this visit.  GEN:  The patient appears stated age and is in NAD. HEENT:  Normocephalic, atraumatic.  The mucous membranes are moist. The superficial temporal arteries are without ropiness or tenderness. CV:  RRR Lungs:  CTAB Neck/HEME:  There are no carotid bruits bilaterally.  Neurological examination:  Orientation: The patient is alert and oriented x3. Cranial nerves: There is good facial symmetry with*** facial hypomimia. The speech is fluent and clear. Soft palate rises symmetrically and there is no tongue deviation. Hearing is intact to conversational tone. Sensation: Sensation is intact to light touch throughout Motor: Strength is at least antigravity x4.  Movement examination: Tone: There is ***tone in the *** Abnormal movements: *** Coordination:  There is *** decremation with RAM's, *** Gait and Station: The patient has *** difficulty arising out of a deep-seated chair without the use of the hands. The patient's stride length is ***.  The patient has a *** pull test.     I have reviewed and interpreted the following labs independently    Chemistry      Component Value Date/Time   NA 125 (L) 07/27/2020 1157   NA 134 (L) 06/26/2017 1313   K 3.8 07/27/2020 1157   K 4.7 06/26/2017 1313   CL 88 (L) 07/27/2020 1157   CO2 28 07/27/2020 1157  CO2 28 06/26/2017 1313   BUN 8 07/27/2020 1157   BUN 13.1 06/26/2017 1313   CREATININE 0.87 07/27/2020 1157   CREATININE 0.96 (H) 07/12/2020 1354   CREATININE 1.0 06/26/2017 1313      Component Value Date/Time   CALCIUM 10.0 07/27/2020 1157   CALCIUM 9.7 06/26/2017 1313   ALKPHOS 116 07/27/2020 1157   ALKPHOS 140 06/26/2017 1313   AST 20 07/27/2020 1157   AST 174 (HH) 06/26/2017 1313   ALT <6 07/27/2020 1157   ALT 246 (H) 06/26/2017 1313   BILITOT 0.7 07/27/2020 1157   BILITOT 0.61 06/26/2017 1313        Lab Results  Component Value Date   WBC 5.2 07/27/2020   HGB 13.5 07/27/2020   HCT 38.8 07/27/2020   MCV 86.4 07/27/2020   PLT 180 07/27/2020    Lab Results  Component Value Date   TSH 3.38 12/25/2019     Total time spent on today's visit was ***30 minutes, including both face-to-face time and nonface-to-face time.  Time included that spent on review of records (prior notes available to me/labs/imaging if pertinent), discussing treatment and goals, answering patient's questions and coordinating care.  Cc:  Orma Flaming, MD

## 2021-01-12 NOTE — Telephone Encounter (Signed)
Received call from patient in a panic state stating she over slept and did not take her 9 am dose of carbidopa levodopa at exactly 9:00. I advised patient to take her medication now and take her next two dose 4 hours apart as scheduled. Advised patient if she is taking her med at 9:50 then take her next dose at 1:50p then her final dose at 5:50p. This is exactly what I told the patient and she said she would write it down. She states she has an appointment with our office tomorrow and wants to make sure she is taking her medication correctly.   Spoke with Dr Tat and she is agreeable with the instructions I gave the patient.   Patient made aware and voiced understanding.

## 2021-01-13 ENCOUNTER — Encounter: Payer: Self-pay | Admitting: Neurology

## 2021-01-13 ENCOUNTER — Ambulatory Visit: Payer: Medicare Other | Admitting: Neurology

## 2021-01-13 DIAGNOSIS — Z029 Encounter for administrative examinations, unspecified: Secondary | ICD-10-CM

## 2021-01-16 ENCOUNTER — Telehealth: Payer: Self-pay | Admitting: Neurology

## 2021-01-16 NOTE — Telephone Encounter (Signed)
I tried to talk to patient, will have [place], touch base with her.

## 2021-01-16 NOTE — Telephone Encounter (Signed)
Called and spoke to patient and informed her based off her new on set of confusion that she needs to be seen today at the ER or Urgent care. Patient denies having SOB, or any breathing issues. Patient stated that she "looks intoxicated."   While speaking to patient she stated she didn't want to wait in the ER and that "my laundry is outside" and "I don't have much money." Informed patient again that Per Dr. Carles Collet she needs to be evaluated today at the ER or an Urgent care to ensure she is ok.   Patient verbalized understanding and stated she will seek care today at either the ER or Urgent care.

## 2021-01-16 NOTE — Telephone Encounter (Signed)
Patient called in stating she was returning a call. ?

## 2021-01-16 NOTE — Telephone Encounter (Signed)
If new onset confusion, needs to go to the ER or PCP today.  She no showed her appt here on friday

## 2021-01-17 ENCOUNTER — Other Ambulatory Visit: Payer: Self-pay

## 2021-01-18 ENCOUNTER — Emergency Department (HOSPITAL_COMMUNITY): Payer: Medicare Other

## 2021-01-18 ENCOUNTER — Other Ambulatory Visit: Payer: Self-pay

## 2021-01-18 ENCOUNTER — Emergency Department (HOSPITAL_COMMUNITY)
Admission: EM | Admit: 2021-01-18 | Discharge: 2021-01-21 | Disposition: A | Discharge status: Home / Self Care | Payer: Medicare Other | Attending: Emergency Medicine | Admitting: Emergency Medicine

## 2021-01-18 DIAGNOSIS — Z20822 Contact with and (suspected) exposure to covid-19: Secondary | ICD-10-CM | POA: Insufficient documentation

## 2021-01-18 DIAGNOSIS — N390 Urinary tract infection, site not specified: Secondary | ICD-10-CM

## 2021-01-18 DIAGNOSIS — I10 Essential (primary) hypertension: Secondary | ICD-10-CM | POA: Diagnosis not present

## 2021-01-18 DIAGNOSIS — Z853 Personal history of malignant neoplasm of breast: Secondary | ICD-10-CM | POA: Diagnosis not present

## 2021-01-18 DIAGNOSIS — R404 Transient alteration of awareness: Secondary | ICD-10-CM | POA: Diagnosis not present

## 2021-01-18 DIAGNOSIS — G20 Parkinson's disease: Secondary | ICD-10-CM | POA: Diagnosis not present

## 2021-01-18 DIAGNOSIS — R410 Disorientation, unspecified: Secondary | ICD-10-CM

## 2021-01-18 DIAGNOSIS — R4689 Other symptoms and signs involving appearance and behavior: Secondary | ICD-10-CM

## 2021-01-18 DIAGNOSIS — R4182 Altered mental status, unspecified: Secondary | ICD-10-CM | POA: Insufficient documentation

## 2021-01-18 DIAGNOSIS — F600 Paranoid personality disorder: Secondary | ICD-10-CM | POA: Diagnosis not present

## 2021-01-18 DIAGNOSIS — R456 Violent behavior: Secondary | ICD-10-CM | POA: Insufficient documentation

## 2021-01-18 DIAGNOSIS — G319 Degenerative disease of nervous system, unspecified: Secondary | ICD-10-CM | POA: Diagnosis not present

## 2021-01-18 DIAGNOSIS — Z8669 Personal history of other diseases of the nervous system and sense organs: Secondary | ICD-10-CM

## 2021-01-18 DIAGNOSIS — F22 Delusional disorders: Secondary | ICD-10-CM

## 2021-01-18 DIAGNOSIS — E871 Hypo-osmolality and hyponatremia: Secondary | ICD-10-CM

## 2021-01-18 DIAGNOSIS — E162 Hypoglycemia, unspecified: Secondary | ICD-10-CM | POA: Diagnosis not present

## 2021-01-18 DIAGNOSIS — F4489 Other dissociative and conversion disorders: Secondary | ICD-10-CM | POA: Diagnosis not present

## 2021-01-18 DIAGNOSIS — E161 Other hypoglycemia: Secondary | ICD-10-CM | POA: Diagnosis not present

## 2021-01-18 LAB — COMPREHENSIVE METABOLIC PANEL
ALT: 12 U/L (ref 0–44)
AST: 34 U/L (ref 15–41)
Albumin: 4.5 g/dL (ref 3.5–5.0)
Alkaline Phosphatase: 99 U/L (ref 38–126)
Anion gap: 14 (ref 5–15)
BUN: 23 mg/dL (ref 8–23)
CO2: 24 mmol/L (ref 22–32)
Calcium: 10.1 mg/dL (ref 8.9–10.3)
Chloride: 87 mmol/L — ABNORMAL LOW (ref 98–111)
Creatinine, Ser: 1.03 mg/dL — ABNORMAL HIGH (ref 0.44–1.00)
GFR, Estimated: 56 mL/min — ABNORMAL LOW (ref >60)
Glucose, Bld: 100 mg/dL — ABNORMAL HIGH (ref 70–99)
Potassium: 4.3 mmol/L (ref 3.5–5.1)
Sodium: 125 mmol/L — ABNORMAL LOW (ref 135–145)
Total Bilirubin: 1.5 mg/dL — ABNORMAL HIGH (ref 0.3–1.2)
Total Protein: 7.6 g/dL (ref 6.5–8.1)

## 2021-01-18 LAB — CBC WITH DIFFERENTIAL/PLATELET
Abs Immature Granulocytes: 0.01 10*3/uL (ref 0.00–0.07)
Basophils Absolute: 0.1 10*3/uL (ref 0.0–0.1)
Basophils Relative: 1 %
Eosinophils Absolute: 0 10*3/uL (ref 0.0–0.5)
Eosinophils Relative: 1 %
HCT: 39.7 % (ref 36.0–46.0)
Hemoglobin: 13.6 g/dL (ref 12.0–15.0)
Immature Granulocytes: 0 %
Lymphocytes Relative: 12 %
Lymphs Abs: 0.8 10*3/uL (ref 0.7–4.0)
MCH: 29.8 pg (ref 26.0–34.0)
MCHC: 34.3 g/dL (ref 30.0–36.0)
MCV: 87.1 fL (ref 80.0–100.0)
Monocytes Absolute: 0.9 10*3/uL (ref 0.1–1.0)
Monocytes Relative: 14 %
Neutro Abs: 4.7 10*3/uL (ref 1.7–7.7)
Neutrophils Relative %: 72 %
Platelets: 144 10*3/uL — ABNORMAL LOW (ref 150–400)
RBC: 4.56 MIL/uL (ref 3.87–5.11)
RDW: 13 % (ref 11.5–15.5)
WBC: 6.5 10*3/uL (ref 4.0–10.5)
nRBC: 0 % (ref 0.0–0.2)

## 2021-01-18 MED ORDER — ZIPRASIDONE MESYLATE 20 MG IM SOLR
5.0000 mg | Freq: Once | INTRAMUSCULAR | Status: AC
  Administered 2021-01-18: 5 mg via INTRAMUSCULAR
  Filled 2021-01-18: qty 20

## 2021-01-18 MED ORDER — STERILE WATER FOR INJECTION IJ SOLN
INTRAMUSCULAR | Status: AC
  Filled 2021-01-18: qty 10

## 2021-01-18 MED ORDER — ZIPRASIDONE MESYLATE 20 MG IM SOLR
10.0000 mg | Freq: Once | INTRAMUSCULAR | Status: AC
  Administered 2021-01-18: 10 mg via INTRAMUSCULAR
  Filled 2021-01-18: qty 20

## 2021-01-18 NOTE — ED Triage Notes (Signed)
Patient BIB from home with AMS. EMS reported neighbor called them about patient wandering around. EMS reported patient unable to follow commands, unable to answer questions and verbally aggressive.

## 2021-01-18 NOTE — ED Provider Notes (Signed)
Sarah Mata DEPT Provider Note   CSN: [id number] Arrival date & time: 01/18/21  2058     History Chief Complaint  Patient presents with  . Altered Mental Status    Sarah Mata is a 80 y.o. female.  Patient to ED by EMS after being called by a neighbor regarding confusion and mental status change. No other information is available. Patient is uncooperative and gives no detail except "I don't want to talk about anything".   Sister, Sarah Mata, who lives in [place], was contacted regarding patient. She reports progressive mental status decline described as increasing paranoia and concern over taking her medications. She is treated for Parkinson's. She lives independently but sister has had concerns about whether she should continue.   The history is provided by the patient and the EMS personnel. No language interpreter was used.  Altered Mental Status      Past Medical History:  Diagnosis Date  . Allergy   . Arthritis   . Breast cancer ([place])   . Heart murmur   . Lupus ([place])   . Personal history of chemotherapy   . Personal history of radiation therapy   . Tremor of right hand 08/02/2017    Patient Active Problem List   Diagnosis Date Noted  . Benign essential HTN 07/11/2020  . Hyperlipidemia 12/28/2019  . Parkinson's disease ([place]) 12/23/2019  . 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 Eustachian tube dysfunction 09/16/2017  . Atherosclerosis of aorta ([place]) 09/16/2017  . Systemic lupus erythematosus ([place]) 09/16/2017  . Chronic diarrhea 06/15/2017  . Vitamin D deficiency 05/21/2017  . Cancer of right breast, stage 2 ([place]) 03/28/2017    Past Surgical History:  Procedure Laterality Date  . BREAST BIOPSY    . BREAST LUMPECTOMY Right 2015  . Broken Nose     Patient fell.  Marland Kitchen FOOT SURGERY Right 2010  . REDUCTION MAMMAPLASTY Bilateral 2015     OB History    Gravida  0   Para  0   Term  0   Preterm  0   AB  0    Living  0     SAB  0   IAB  0   Ectopic  0   Multiple  0   Live Births  0           Family History  Problem Relation Age of Onset  . Cancer Mother        breast cancer   . Breast cancer Mother   . Hypertension Mother   . Diabetes Sister   . Hypertension Sister   . Heart disease Father   . Breast cancer Sister   . Colon cancer Neg Hx   . Esophageal cancer Neg Hx   . Pancreatic cancer Neg Hx   . Rectal cancer Neg Hx   . Stomach cancer Neg Hx     Social History   Tobacco Use  . Smoking status: Never Smoker  . Smokeless tobacco: Never Used  Vaping Use  . Vaping Use: Never used  Substance Use Topics  . Alcohol use: Yes    Comment: ONCE A MONTH  . Drug use: No    Home Medications Prior to Admission medications   Medication Sig Start Date End Date Taking? Authorizing Provider  anastrozole (ARIMIDEX) 1 MG tablet Take 1 tablet (1 mg total) by mouth daily. 10/19/20   Truitt Merle, MD  Ascorbic Acid (VITAMIN C) 1000 MG tablet Take 1,000 mg by mouth daily.  [provider]  carbidopa-levodopa (SINEMET IR) 25-100 MG tablet TAKE 1 TABLET THREE TIMES A DAY (9A.M., 1 P.M., 5 P.M.) 12/28/20   Tat, Eustace Quail, DO  Cholecalciferol (VITAMIN D3) 2000 units capsule Take 5,000 Units by mouth daily.     [provider]  Coenzyme Q10 (CO Q-10) 100 MG CAPS Take by mouth. Two caps daily    [provider]  UNABLE TO FIND 1 MD Bone builder; 1 in Am, and 1 in PM    [provider]    Allergies    Tetracyclines & related  Review of Systems   Review of Systems  Unable to perform ROS: Mental status change    Physical Exam Updated Vital Signs BP (!) 145/87   Pulse 73   Resp (!) 24   Ht 5\' 3"  (1.6 m)   Wt 76.2 kg   SpO2 98%   BMI 29.76 kg/m   Physical Exam Vitals and nursing note reviewed.  HENT:     Head: Atraumatic.  Pulmonary:     Effort: Pulmonary effort is normal. No respiratory distress.  Musculoskeletal:        General:  Normal range of motion.  Neurological:     Mental Status: She is alert.  Psychiatric:     Comments: Patient is refusing to engage in conversation. She is refusing any medical care and is combative with attempt.      ED Results / Procedures / Treatments   Labs (all labs ordered are listed, but only abnormal results are displayed) Labs Reviewed  URINE CULTURE  CBC WITH DIFFERENTIAL/PLATELET  COMPREHENSIVE METABOLIC PANEL  URINALYSIS, ROUTINE W REFLEX MICROSCOPIC    EKG None  Radiology No results found.  Procedures Procedures CRITICAL CARE Performed by: Dewaine Oats   Total critical care time: 45 minutes  Critical care time was exclusive of separately billable procedures and treating other patients.  Critical care was necessary to treat or prevent imminent or life-threatening deterioration.  Critical care was time spent personally by me on the following activities: development of treatment plan with patient and/or surrogate as well as nursing, discussions with consultants, evaluation of patient's response to treatment, examination of patient, obtaining history from patient or surrogate, ordering and performing treatments and interventions, ordering and review of laboratory studies, ordering and review of radiographic studies, pulse oximetry and re-evaluation of patient's condition.   Medications Ordered in ED Medications - No data to display  ED Course  I have reviewed the triage vital signs and the nursing notes.  Pertinent labs & imaging results that were available during my care of the patient were reviewed by me and considered in my medical decision making (see chart for details).    MDM Rules/Calculators/A&P                          Patient to ED by EMS after a neighbor called regarding confusion and wandering. On arrival she states she does not want to be here, will not provide any information and is not cooperative with exam.   Patient's sister is able to  confirm this is not her baseline. It is felt the patient is not able to make rational decisions at this time and requires medical evaluation. It is felt her presentation is likely related to dementia and/or psychiatric condition given sister's account of progressive decline and paranoia, patient's statements here regarding being part of a TV show and she is playing a role. She  is not felt to have capacity currently to make decisions regarding her care. Anticipate a negative medical work up and need for psychiatric evaluation. Geodon ordered due to combativeness. Will give 5 mg due to age.   On recheck, the patient is still awake, refusing care. She is starting to escalate with screaming/shouting. Additional Geodon ordered.   Patient is now sedated allowing for continuation of medical work up.   Labs show hyponatremia of 125. On chart review, she has had same in the past. No evidence of infection, or other significant derangements. UA is nitrite positive but without bacteria or significant leukocytes. Culture pending. Antibiotics not indicated. Head CT negative.   The patient's presentation is felt to be psychiatric. TTS evaluation pending.    Final Clinical Impression(s) / ED Diagnoses Final diagnoses:  None   1. Altered mental status 2. Combative behavior  Rx / DC Orders ED Discharge Orders    None       Charlann Lange, PA-C 01/19/21 [id number]    Gareth Morgan, MD 01/20/21 [phone]

## 2021-01-18 NOTE — ED Notes (Signed)
Patient was very aggressive when we were trying to EKG, Vitals. However, the RN has given med's to calm her down and we will try again to get vitals and EKG.

## 2021-01-19 ENCOUNTER — Emergency Department (HOSPITAL_COMMUNITY): Payer: Medicare Other

## 2021-01-19 DIAGNOSIS — G20 Parkinson's disease: Secondary | ICD-10-CM

## 2021-01-19 DIAGNOSIS — G319 Degenerative disease of nervous system, unspecified: Secondary | ICD-10-CM | POA: Diagnosis not present

## 2021-01-19 DIAGNOSIS — R9431 Abnormal electrocardiogram [ECG] [EKG]: Secondary | ICD-10-CM | POA: Diagnosis not present

## 2021-01-19 DIAGNOSIS — R456 Violent behavior: Secondary | ICD-10-CM | POA: Diagnosis not present

## 2021-01-19 DIAGNOSIS — F4489 Other dissociative and conversion disorders: Secondary | ICD-10-CM | POA: Diagnosis not present

## 2021-01-19 DIAGNOSIS — R4182 Altered mental status, unspecified: Secondary | ICD-10-CM | POA: Diagnosis not present

## 2021-01-19 DIAGNOSIS — I10 Essential (primary) hypertension: Secondary | ICD-10-CM | POA: Diagnosis not present

## 2021-01-19 DIAGNOSIS — F600 Paranoid personality disorder: Secondary | ICD-10-CM | POA: Diagnosis not present

## 2021-01-19 LAB — URINALYSIS, ROUTINE W REFLEX MICROSCOPIC
Bilirubin Urine: NEGATIVE
Glucose, UA: NEGATIVE mg/dL
Hgb urine dipstick: NEGATIVE
Ketones, ur: 15 mg/dL — AB
Nitrite: POSITIVE — AB
Protein, ur: NEGATIVE mg/dL
Specific Gravity, Urine: 1.015 (ref 1.005–1.030)
pH: 6 (ref 5.0–8.0)

## 2021-01-19 LAB — URINALYSIS, MICROSCOPIC (REFLEX)
Bacteria, UA: NONE SEEN
Squamous Epithelial / HPF: NONE SEEN (ref 0–5)

## 2021-01-19 LAB — RESP PANEL BY RT-PCR (FLU A&B, COVID) ARPGX2
Influenza A by PCR: NEGATIVE
Influenza B by PCR: NEGATIVE
SARS Coronavirus 2 by RT PCR: NEGATIVE

## 2021-01-19 MED ORDER — SODIUM CHLORIDE 0.9 % IV BOLUS
1000.0000 mL | Freq: Once | INTRAVENOUS | Status: AC
  Administered 2021-01-19: 1000 mL via INTRAVENOUS

## 2021-01-19 MED ORDER — CARBIDOPA-LEVODOPA 25-100 MG PO TABS
1.0000 | ORAL_TABLET | ORAL | Status: DC
  Administered 2021-01-19 – 2021-01-20 (×5): 1 via ORAL
  Filled 2021-01-19 (×8): qty 1

## 2021-01-19 MED ORDER — CEPHALEXIN 500 MG PO CAPS
500.0000 mg | ORAL_CAPSULE | Freq: Four times a day (QID) | ORAL | Status: DC
  Administered 2021-01-19 – 2021-01-20 (×7): 500 mg via ORAL
  Filled 2021-01-19 (×7): qty 1

## 2021-01-19 MED ORDER — SODIUM CHLORIDE 0.9 % IV BOLUS
500.0000 mL | Freq: Once | INTRAVENOUS | Status: AC
  Administered 2021-01-19: 500 mL via INTRAVENOUS

## 2021-01-19 MED ORDER — ANASTROZOLE 1 MG PO TABS
1.0000 mg | ORAL_TABLET | Freq: Every day | ORAL | Status: DC
  Administered 2021-01-19 – 2021-01-20 (×2): 1 mg via ORAL
  Filled 2021-01-19 (×2): qty 1

## 2021-01-19 NOTE — ED Notes (Signed)
Patient belongings placed in locker 14 in [place]. Including patients phone, insurance cards, jeans, gray turtle neck, 1 pair of shoes.

## 2021-01-19 NOTE — ED Notes (Signed)
Checked pt. Pt was wet. Cleaned pt, put new sheets on bed, new brief and purewick.

## 2021-01-19 NOTE — ED Notes (Signed)
Belongings placed in locker 30

## 2021-01-19 NOTE — ED Notes (Signed)
Dr. Karle Starch notified about patients elevated blood pressure.

## 2021-01-19 NOTE — ED Notes (Signed)
Jude, patients sister, was called and updated that patient is still here at the hospital awaiting social work consult and SNF placement. Jude is flying into town and is planning to be at the hospital tomorrow at 12pm. She wants to be called when patient status changes/dispo is set.

## 2021-01-19 NOTE — BH Assessment (Signed)
[facility] Assessment Progress Note  Per Letitia Libra, NP via verbal report, this voluntary pt does not require psychiatric hospitalization at this time.  Pt is psychiatrically cleared.  Pt is reportedly a current patient of [facility].  She does not need behavioral health referrals at this time.  EDP Lajean Saver, MD and pt's nurse, Ria Comment, have been notified.  Jalene Mullet, [facility] Triage Specialist [phone]

## 2021-01-19 NOTE — ED Notes (Signed)
Please call sister Jude Cervellera @ for a status update [phone]

## 2021-01-19 NOTE — ED Notes (Signed)
TTS starting

## 2021-01-19 NOTE — ED Notes (Signed)
Patient unable to follow commands, Confuse and verbally Aggressive to staff. EDP infomed. Geodon Given. WIll continue to monitor.

## 2021-01-19 NOTE — BH Assessment (Addendum)
Clinician inquired about completing pt's CCA via IM at 0212 but was told pt is asleep. Clinician inquired as to whether pt was either asleep or was unable to be aroused due to being under the effects of the Geodon she was given; Charlann Lange, PA, stated pt is "pretty sedated" and to "give it a couple hours." Clinician provided her phone number for pt's providers to contact her in the case that pt becomes responsive prior to clinician checking in again. [phone]

## 2021-01-19 NOTE — ED Notes (Signed)
Pt is hypertensive, MD Karle Starch) made aware

## 2021-01-19 NOTE — Consult Note (Signed)
Telepsych Consultation   Reason for Consult: Psychiatry provider reassessment Referring Physician: Dr. Ashok Cordia Location of Patient: [facility] emergency department Location of Provider: [facility] Department  Patient Identification: Oriah Leinweber MRN:  [id number] Principal Diagnosis: Parkinson's disease [facility]) Diagnosis:  Principal Problem:   Parkinson's disease ([place])   Total Time spent with patient: 20 minutes  Subjective:   Veatrice Eckstein is a 80 y.o. female patient admitted with altered mental status.  Patient states "I am a little confused, just give me a few minutes please."  Patient reports she would like to be admitted to the hospital.  Patient states "I have Parkinson's and cancer I think it makes me confused."  HPI:   Patient assessed by nurse practitioner.  Patient alert to self and time.  Patient appears confused related to situation.  Patient believes she is in a different city and is unable to articulate current [place] president.  Patient alert and minimally cooperative with assessment.  Patient noted to speak in soft tone.  Patient reports she has concern related to Parkinson's diagnosis.  Patient reports she would like to be seen by her outpatient neurologist.  Patient denies any history of mental illness.  Patient denies any follow-up with outpatient psychiatry.  Patient denies any suicidal or homicidal ideations.  Patient denies both auditory and visual hallucinations.  Patient denies symptoms of paranoia.  Patient reports average appetite.  Patient reports she sleeps between 6 and 7 hours per night.  Patient reports she resides in [place] alone.  Patient denies access to weapons.  Patient is retired.  Patient denies alcohol and substance use.  Patient offered support and encouragement.  Past Psychiatric History: None reported  Risk to Self:  Denies Risk to Others:  Denies Prior Inpatient Therapy:  None reported Prior Outpatient Therapy:  None  reported  Past Medical History:  Past Medical History:  Diagnosis Date  . Allergy   . Arthritis   . Breast cancer ([place])   . Heart murmur   . Lupus ([place])   . Personal history of chemotherapy   . Personal history of radiation therapy   . Tremor of right hand 08/02/2017    Past Surgical History:  Procedure Laterality Date  . BREAST BIOPSY    . BREAST LUMPECTOMY Right 2015  . Broken Nose     Patient fell.  Marland Kitchen FOOT SURGERY Right 2010  . REDUCTION MAMMAPLASTY Bilateral 2015   Family History:  Family History  Problem Relation Age of Onset  . Cancer Mother        breast cancer   . Breast cancer Mother   . Hypertension Mother   . Diabetes Sister   . Hypertension Sister   . Heart disease Father   . Breast cancer Sister   . Colon cancer Neg Hx   . Esophageal cancer Neg Hx   . Pancreatic cancer Neg Hx   . Rectal cancer Neg Hx   . Stomach cancer Neg Hx    Family Psychiatric  History: None reported Social History:  Social History   Substance and Sexual Activity  Alcohol Use Yes   Comment: ONCE A MONTH     Social History   Substance and Sexual Activity  Drug Use No    Social History   Socioeconomic History  . Marital status: Widowed    Spouse name: Not on file  . Number of children: Not on file  . Years of education: Not on file  . Highest education level: Not on file  Occupational History  . 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 Smoking status: Never Smoker  . Smokeless tobacco: Never Used  Vaping Use  . Vaping Use: Never used  Substance and Sexual Activity  . Alcohol use: Yes    Comment: ONCE A MONTH  . Drug use: No  . Sexual activity: Not Currently    Comment: 1st intercourse- 24, partners- 4, widow  Other Topics Concern  . Not on file  Social History Narrative  . Not on file   Social Determinants of Health   Financial Resource Strain: Not on file  Food Insecurity: No Food Insecurity  .  Worried About Charity fundraiser in the Last Year: Never true  . Ran Out of Food in the Last Year: Never true  Transportation Needs: Not on file  Physical Activity: Inactive  . Days of Exercise per Week: 0 days  . Minutes of Exercise per Session: 0 min  Stress: No Stress Concern Present  . Feeling of Stress : Only a little  Social Connections: Not on file   Additional Social History:    Allergies:   Allergies  Allergen Reactions  . Tetracyclines & Related Other (See Comments)    Stomach burning    Labs:  Results for orders placed or performed during the hospital encounter of 01/18/21 (from the past 48 hour(s))  CBC with Differential     Status: Abnormal   Collection Time: 01/18/21 10:18 PM  Result Value Ref Range   WBC 6.5 4.0 - 10.5 K/uL   RBC 4.56 3.87 - 5.11 MIL/uL   Hemoglobin 13.6 12.0 - 15.0 g/dL   HCT 39.7 36.0 - 46.0 %   MCV 87.1 80.0 - 100.0 fL   MCH 29.8 26.0 - 34.0 pg   MCHC 34.3 30.0 - 36.0 g/dL   RDW 13.0 11.5 - 15.5 %   Platelets 144 (L) 150 - 400 K/uL   nRBC 0.0 0.0 - 0.2 %   Neutrophils Relative % 72 %   Neutro Abs 4.7 1.7 - 7.7 K/uL   Lymphocytes Relative 12 %   Lymphs Abs 0.8 0.7 - 4.0 K/uL   Monocytes Relative 14 %   Monocytes Absolute 0.9 0.1 - 1.0 K/uL   Eosinophils Relative 1 %   Eosinophils Absolute 0.0 0.0 - 0.5 K/uL   Basophils Relative 1 %   Basophils Absolute 0.1 0.0 - 0.1 K/uL   Immature Granulocytes 0 %   Abs Immature Granulocytes 0.01 0.00 - 0.07 K/uL    Comment: Performed at [facility], [place] [address], [place], [place] [address]  Comprehensive metabolic panel     Status: Abnormal   Collection Time: 01/18/21 10:18 PM  Result Value Ref Range   Sodium 125 (L) 135 - 145 mmol/L   Potassium 4.3 3.5 - 5.1 mmol/L   Chloride 87 (L) 98 - 111 mmol/L   CO2 24 22 - 32 mmol/L   Glucose, Bld 100 (H) 70 - 99 mg/dL    Comment: Glucose reference range applies only to samples taken after fasting for at least 8 hours.   BUN 23  8 - 23 mg/dL   Creatinine, Ser 1.03 (H) 0.44 - 1.00 mg/dL   Calcium 10.1 8.9 - 10.3 mg/dL   Total Protein 7.6 6.5 - 8.1 g/dL   Albumin 4.5 3.5 - 5.0 g/dL   AST 34 15 - 41 U/L   ALT 12 0 - 44  U/L   Alkaline Phosphatase 99 38 - 126 U/L   Total Bilirubin 1.5 (H) 0.3 - 1.2 mg/dL   GFR, Estimated 56 (L) >60 mL/min    Comment: (NOTE) Calculated using the CKD-EPI Creatinine Equation (2021)    Anion gap 14 5 - 15    Comment: Performed at [facility], [place] [address] [place], [place] [address]  Urinalysis, Routine w reflex microscopic     Status: Abnormal   Collection Time: 01/19/21  4:30 AM  Result Value Ref Range   Color, Urine YELLOW YELLOW   APPearance CLEAR CLEAR   Specific Gravity, Urine 1.015 1.005 - 1.030   pH 6.0 5.0 - 8.0   Glucose, UA NEGATIVE NEGATIVE mg/dL   Hgb urine dipstick NEGATIVE NEGATIVE   Bilirubin Urine NEGATIVE NEGATIVE   Ketones, ur 15 (A) NEGATIVE mg/dL   Protein, ur NEGATIVE NEGATIVE mg/dL   Nitrite POSITIVE (A) NEGATIVE   Leukocytes,Ua SMALL (A) NEGATIVE    Comment: Performed at [facility] [address], [place], [place] [address]  Urinalysis, Microscopic (reflex)     Status: None   Collection Time: 01/19/21  4:30 AM  Result Value Ref Range   RBC / HPF 0-5 0 - 5 RBC/hpf   WBC, UA 0-5 0 - 5 WBC/hpf   Bacteria, UA NONE SEEN NONE SEEN   Squamous Epithelial / LPF NONE SEEN 0 - 5    Comment: Performed at [facility], [place] [address], [place], [place] [address]  Resp Panel by RT-PCR (Flu A&B, Covid) Nasopharyngeal Swab     Status: None   Collection Time: 01/19/21  6:14 AM   Specimen: Nasopharyngeal Swab; Nasopharyngeal(NP) swabs in vial transport medium  Result Value Ref Range   SARS Coronavirus 2 by RT PCR NEGATIVE NEGATIVE    Comment: (NOTE) SARS-CoV-2 target nucleic acids are NOT DETECTED.  The SARS-CoV-2 RNA is generally detectable in upper respiratory specimens during the acute phase of  infection. The lowest concentration of SARS-CoV-2 viral copies this assay can detect is 138 copies/mL. A negative result does not preclude SARS-Cov-2 infection and should not be used as the sole basis for treatment or other patient management decisions. A negative result may occur with  improper specimen collection/handling, submission of specimen other than nasopharyngeal swab, presence of viral mutation(s) within the areas targeted by this assay, and inadequate number of viral copies(<138 copies/mL). A negative result must be combined with clinical observations, patient history, and epidemiological information. The expected result is Negative.  Fact Sheet for Patients:  EntrepreneurPulse.com.au  Fact Sheet for Healthcare Providers:  IncredibleEmployment.be  This test is no t yet approved or cleared by the [place] FDA and  has been authorized for detection and/or diagnosis of SARS-CoV-2 by FDA under an Emergency Use Authorization (EUA). This EUA will remain  in effect (meaning this test can be used) for the duration of the COVID-19 declaration under Section 564(b)(1) of the Act, 21 U.S.C.section 360bbb-3(b)(1), unless the authorization is terminated  or revoked sooner.       Influenza A by PCR NEGATIVE NEGATIVE   Influenza B by PCR NEGATIVE NEGATIVE    Comment: (NOTE) The Xpert Xpress SARS-CoV-2/FLU/RSV plus assay is intended as an aid in the diagnosis of influenza from Nasopharyngeal swab specimens and should not be used as a sole basis for treatment. Nasal washings and aspirates are unacceptable for Xpert Xpress SARS-CoV-2/FLU/RSV testing.  Fact Sheet for Patients: EntrepreneurPulse.com.au  Fact Sheet for Healthcare Providers: IncredibleEmployment.be  This test is not yet approved or cleared by the [place] and has been authorized for detection and/or diagnosis of SARS-CoV-2 by FDA  under an Emergency Use Authorization (EUA). This EUA will remain in effect (meaning this test can be used) for the duration of the COVID-19 declaration under Section 564(b)(1) of the Act, 21 U.S.C. section 360bbb-3(b)(1), unless the authorization is terminated or revoked.  Performed at [facility], [place] [address] [place], [place] [address]     Medications:  Current Facility-Administered Medications  Medication Dose Route Frequency Provider Last Rate Last Admin  . anastrozole (ARIMIDEX) tablet 1 mg  1 mg Oral Daily Lajean Saver, MD      . carbidopa-levodopa (SINEMET IR) 25-100 MG per tablet immediate release 1 tablet  1 tablet Oral TID Lajean Saver, MD      . cephALEXin (KEFLEX) capsule 500 mg  500 mg Oral QID Lajean Saver, MD   500 mg at 01/19/21 1002   Current Outpatient Medications  Medication Sig Dispense Refill  . anastrozole (ARIMIDEX) 1 MG tablet Take 1 tablet (1 mg total) by mouth daily. 90 tablet 3  . Ascorbic Acid (VITAMIN C) 1000 MG tablet Take 1,000 mg by mouth daily.    . carbidopa-levodopa (SINEMET IR) 25-100 MG tablet TAKE 1 TABLET THREE TIMES A DAY (9A.M., 1 P.M., 5 P.M.) 270 tablet 0  . Cholecalciferol (VITAMIN D3) 2000 units capsule Take 5,000 Units by mouth daily.     . Coenzyme Q10 (CO Q-10) 100 MG CAPS Take by mouth. Two caps daily    . UNABLE TO FIND 1 MD Bone builder; 1 in Am, and 1 in PM      Musculoskeletal: Strength & Muscle Tone: decreased Gait & Station: unable to assess Patient leans: N/A  Psychiatric Specialty Exam: Physical Exam Vitals and nursing note reviewed.  Constitutional:      Appearance: She is well-developed.  HENT:     Head: Normocephalic.  Cardiovascular:     Rate and Rhythm: Normal rate.  Pulmonary:     Effort: Pulmonary effort is normal.  Neurological:     Mental Status: She is alert.  Psychiatric:        Attention and Perception: Attention and perception normal.     Review of Systems  Blood pressure  (!) 173/89, pulse 77, temperature 97.8 F (36.6 C), temperature source Axillary, resp. rate (!) 22, height 5\' 3"  (1.6 m), weight 76.2 kg, SpO2 99 %.Body mass index is 29.76 kg/m.  General Appearance: Disheveled  Eye Contact:  Fair  Speech:  Slow  Volume:  Decreased  Mood:  Euthymic  Affect:  Congruent  Thought Process:  Goal Directed  Orientation:  Other:  self, time  Thought Content:  Logical  Suicidal Thoughts:  No  Homicidal Thoughts:  No  Memory:  Immediate;   Poor  Judgement:  Impaired  Insight:  Fair  Psychomotor Activity:  Normal  Concentration:  Concentration: Fair  Recall:  [employer] of Knowledge:  Fair  Language:  Fair  Akathisia:  No  Handed:  Right  AIMS (if indicated):     Assets:  Communication Skills Desire for Improvement Financial Resources/Insurance Housing Intimacy Leisure Time  ADL's:  Impaired  Cognition:  Impaired,  Mild  Sleep:        Treatment Plan Summary: Plan Patient reviewed with Dr. Dwyane Dee.  Patient cleared by psychiatry. Social work consult initiated. Recommend consider neurology consult per patient request.  Disposition: No evidence of imminent risk  to self or others at present.   Patient does not meet criteria for psychiatric inpatient admission. Supportive therapy provided about ongoing stressors.  This service was provided via telemedicine using a 2-way, interactive audio and video technology.  Names of all persons participating in this telemedicine service and their role in this encounter. Name: [name] Role: Patient  Name: Letitia Libra Role: FNP  Name: Dr. Dwyane Dee Role: [place], FNP 01/19/2021 1:17 PM

## 2021-01-19 NOTE — ED Notes (Signed)
Called PT office and left message for PT to have one of the therapists see this patient ASAP for discharge.

## 2021-01-19 NOTE — ED Provider Notes (Addendum)
Emergency Medicine Observation Re-evaluation Note  Sarah Mata is a 80 y.o. female, seen on rounds today.  Pt initially presented to the ED for complaints of Altered Mental Status Currently, the patient is resting comfortably.   Physical Exam  BP (!) 152/74   Pulse 63   Temp 97.8 F (36.6 C) (Axillary)   Resp 17   Ht 1.6 m (5\' 3" )   Wt 76.2 kg   SpO2 92%   BMI 29.76 kg/m  Physical Exam General: resting comfortably Cardiac: regular rate Lungs: breathing comfortably Psych: resting comfortably.   ED Course / MDM  EKG:    I have reviewed the labs performed to date as well as medications administered while in observation.  Recent changes in the last 24 hours include BH evaluation and reassessment. TOC consultation for possible placement.    Plan  Current plan is for [facility] reassessment and TOC placement.   The patient has been placed in psychiatric observation due to the need to provide a safe environment for the patient while obtaining psychiatric consultation and evaluation, as well as ongoing medical and medication management to treat the patient's condition.     Lajean Saver, MD 01/19/21 [phone]  [facility] team indicates they plan to psych clear, possible behaviors/confusion related to dementia or Parkinsons - request neurology consulted. Neurology consulted - I discussed pt with neurology, Dr Leonel Ramsay - he indicates would not change any of pts Parkinsons meds, states as nitrite/LE positive feels uti is most likely cause, indicates low NA appears similar to prior - recommends txd for possible uti, and no additional neurologic workup.   Urine culture is pending. Keflex po. Iv ns bolus. Food tray.       Lajean Saver, MD 01/19/21 [phone]

## 2021-01-19 NOTE — Evaluation (Signed)
Physical Therapy Evaluation Patient Details Name: Sarah Mata MRN: [id number] DOB: April 26, 1941 Today's Date: 01/19/2021   History of Present Illness  80  Yo female brought to Ed after negbor called concerning patient wandering outside. PMH of PD, breast ca, right ankle surgery. Lives alone.  Clinical Impression  The patient initially cooperative, mobilized to [facility] for B/B elimination. Patient ambulated using RW in room , requiring min assistance for balance. At end of session, patient began to exit gurney from foot of bed stating" I have to get out of here.I will call a cab." Patient repositioned on bed alarm pad.  Patient apparantly resides on a 3rd floor condo. Unsure if there are steps or an elevator. Patient did not respond. Patient currently requires assistance for safety and taking care of her needs.  Pt admitted with above diagnosis.  Pt currently with functional limitations due to the deficits listed below (see PT Problem List). Pt will benefit from skilled PT to increase their independence and safety with mobility to allow discharge to the venue listed below.       Follow Up Recommendations SNF;Supervision/Assistance - 24 hour    Equipment Recommendations  None recommended by PT    Recommendations for Other Services       Precautions / Restrictions Precautions Precautions: Fall Precaution Comments: anxious,      Mobility  Bed Mobility Overal bed mobility: Needs Assistance Bed Mobility: Supine to Sit;Sit to Supine     Supine to sit: Min guard Sit to supine: Min guard   General bed mobility comments: for safety    Transfers Overall transfer level: Needs assistance Equipment used: Rolling walker (2 wheeled) Transfers: Sit to/from [facility] Sit to Stand: Mod assist Stand pivot transfers: Mod assist       General transfer comment: steady assist to rise from stretcher, turned to [facility], multimodal cues for safety. Steady assist for  balance  Ambulation/Gait Ambulation/Gait assistance: Min assist Gait Distance (Feet): 20 Feet Assistive device: Rolling walker (2 wheeled) Gait Pattern/deviations: Step-to pattern;Step-through pattern;Staggering left;Staggering right     General Gait Details: steady assistance for balance, repeated cues for staying on task of ambulation in room.  Stairs            Wheelchair Mobility    Modified Rankin (Stroke Patients Only)       Balance Overall balance assessment: Needs assistance Sitting-balance support: Feet supported;No upper extremity supported Sitting balance-Leahy Scale: Good     Standing balance support: During functional activity;Single extremity supported Standing balance-Leahy Scale: Poor Standing balance comment: while performing self pericare in standing, required support of Rw and therapis.                             Pertinent Vitals/Pain Pain Assessment: No/denies pain    Home Living Family/patient expects to be discharged to:: Private residence Living Arrangements: Alone   Type of Home: Apartment Home Access: Stairs to enter   [employer] of Steps: lives on 3rd  level, ? number of steps, pt. could not provide.     Additional Comments: unsure, patient is confused as to situation.    Prior Function Level of Independence: Independent               Hand Dominance        Extremity/Trunk Assessment   Upper Extremity Assessment Upper Extremity Assessment: Overall WFL for tasks assessed    Lower Extremity Assessment Lower Extremity Assessment: Generalized weakness;RLE deficits/detail  RLE Deficits / Details: noted healed incision on medial ankle    Cervical / Trunk Assessment Cervical / Trunk Assessment: Normal  Communication   Communication: No difficulties  Cognition Arousal/Alertness: Awake/alert Behavior During Therapy: Anxious;Agitated (beginning gitation at end of asession, wanting to go  home) Overall Cognitive Status: Impaired/Different from baseline Area of Impairment: Orientation;Attention;Memory;Following commands;Safety/judgement;Awareness                 Orientation Level: Situation;Place Current Attention Level: Sustained Memory: Decreased recall of precautions;Decreased short-term memory Following Commands: Follows one step commands consistently Safety/Judgement: Decreased awareness of deficits Awareness: Emergent   General Comments: becoming more restless, wanting to leave, started sliding toward foot of tretcher. Also some paranoia.      General Comments      Exercises     Assessment/Plan    PT Assessment Patient needs continued PT services  PT Problem List Decreased activity tolerance;Decreased safety awareness;Decreased balance;Decreased cognition       PT Treatment Interventions DME instruction;Therapeutic activities;Gait training;Therapeutic exercise;Patient/family education;Functional mobility training;Stair training;Balance training    PT Goals (Current goals can be found in the Care Plan section)  Acute Rehab PT Goals Patient Stated Goal: I have to get out of here PT Goal Formulation: Patient unable to participate in goal setting Time For Goal Achievement: 02/02/21 Potential to Achieve Goals: Fair    Frequency Min 2X/week   Barriers to discharge Decreased caregiver support;Inaccessible home environment lives on 3rd level    Co-evaluation               AM-PAC PT "6 Clicks" Mobility  Outcome Measure Help needed turning from your back to your side while in a flat bed without using bedrails?: A Little Help needed moving from lying on your back to sitting on the side of a flat bed without using bedrails?: A Little Help needed moving to and from a bed to a chair (including a wheelchair)?: A Little Help needed standing up from a chair using your arms (e.g., wheelchair or bedside chair)?: A Little Help needed to walk in hospital  room?: A Little Help needed climbing 3-5 steps with a railing? : A Lot 6 Click Score: 17    End of Session   Activity Tolerance: Patient tolerated treatment well Patient left: in bed;with call bell/phone within reach;with bed alarm set;with nursing/sitter in room Nurse Communication: Mobility status PT Visit Diagnosis: Unsteadiness on feet (R26.81)    Time: 7017-7939 PT Time Calculation (min) (ACUTE ONLY): 48 min   Charges:   PT Evaluation $PT Eval Low Complexity: 1 Low PT Treatments $Gait Training: 8-22 mins $Self Care/Home Management: [place] Pager [phone] Office [phone]   Claretha Cooper 01/19/2021, 3:26 PM

## 2021-01-19 NOTE — ED Notes (Signed)
Brought pt sandwich and water

## 2021-01-19 NOTE — ED Notes (Signed)
Spoke with Dr. Ashok Cordia and social work will be consulted to determine pt's discharge status.

## 2021-01-20 DIAGNOSIS — R4182 Altered mental status, unspecified: Secondary | ICD-10-CM | POA: Diagnosis not present

## 2021-01-20 LAB — URINE CULTURE: Culture: <10000 — AB

## 2021-01-20 MED ORDER — CEPHALEXIN 500 MG PO CAPS: 500.0000 mg | ORAL_CAPSULE | Freq: Four times a day (QID) | ORAL | 0 refills | Status: AC

## 2021-01-20 NOTE — Progress Notes (Signed)
..   Transition of Care [facility]) - Emergency Department Mini Assessment   Patient Details  Name: Sarah Mata MRN: [id number] Date of Birth: 1941-05-18  Transition of Care [facility]) CM/SW Contact:    Arpi Diebold C Tarpley-Carter, [place] Phone Number: 01/20/2021, 9:17 AM   Clinical Narrative: [facility] CM/CSW spoke with pts sister/Sarah Mata who is coming from [place] to assist pt with her current needs.    Joshua Zeringue Tarpley-Carter, MSW, LCSW-A Pronouns:  She, Her, Hers                  [facility] ED Transitions of CareClinical Social Worker [email or web address] [phone]   ED Mini Assessment: What brought you to the Emergency Department? : Altered Mental Status  Barriers to Discharge: No Barriers Identified     Means of departure: Not know  Interventions which prevented an admission or readmission: SNF Placement    Patient Contact and Communications Key Contact 1: [place] with: Sarah Cervellera Contact Date: 01/20/21,     Contact Phone Number: [phone] Call outcome: Sarah was coming in from [place] to assess her sisters needs and assist in anyway.  Patient states their goals for this hospitalization and ongoing recovery are:: Pt currently has altered mental statust. CMS Medicare.gov Compare Post Acute Care list provided to:: Other (Comment Required) (Pts sister/Sarah Mata) Choice offered to / list presented to : NA  Admission diagnosis:  AMS  Patient Active Problem List   Diagnosis Date Noted  . Benign essential HTN 07/11/2020  . Hyperlipidemia 12/28/2019  . Parkinson's disease ([place]) 12/23/2019  . 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 Eustachian tube dysfunction 09/16/2017  . Atherosclerosis of aorta ([place]) 09/16/2017  . Systemic lupus erythematosus ([place]) 09/16/2017  . Chronic diarrhea 06/15/2017  . Vitamin D deficiency 05/21/2017  . Cancer of right breast, stage 2 ([place]) 03/28/2017   PCP:  Orma Flaming, MD Pharmacy:   [place], [place] [address] [place] [address] Phone: [phone] Fax: [phone]  [id number] [address], [place] [address] Phone: [phone] Fax: [phone]

## 2021-01-20 NOTE — Progress Notes (Incomplete)
[facility]   [phone] [phone]   Clinic Follow up Note   Patient Care Team: Wolfe, Allison, MD as PCP - General (Family Medicine) Nandigam, Kavitha V, MD as Consulting Physician (Gastroenterology) Deveshwar, Shaili, MD as Consulting Physician (Rheumatology) Tat, Rebecca S, DO as Consulting Physician (Neurology) Potts, Jacob, RPH as Pharmacist (Pharmacist) Potts, Jacob, RPH as Pharmacist (Pharmacist)  Date of Service:  01/20/2021  CHIEF COMPLAINT: Follow up right breast cancer  SUMMARY OF ONCOLOGIC HISTORY: Oncology History  Cancer of right breast, stage 2 (HCC)  02/2014 Initial Biopsy   Right breast mass biopsy showed invasive ductal carcinoma grade 2, 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 Grade 3, Ki-67 20%.   03/2014 Miscellaneous   mammaprint reviewed high-risk luminal type   04/2014 -  Radiation Therapy   Patient underwent radiation to the right chest wall   08/2014 -  Anti-estrogen oral therapy   Letrozole 2.5 mg started in 08/2014 and due to poor tolerance switched to Anastrozole 1.0 mg, put on hold due to elevated liver enzyme since 05/21/2017 and restarted on 06/26/17   08/06/2017 Pathology Results   Diagnosis, colonoscopy 1. Surgical [P], right colon biopsies - COLONIC MUCOSA WITH BENIGN LYMPHOID AGGREGATES. - NO MICROSCOPIC COLITIS, ACTIVE INFLAMMATION, CHRONIC CHANGES OR GRANULOMAS. 2. Surgical [P], left colon biopsies - COLONIC MUCOSA WITH BENIGN LYMPHOID AGGREGATES. - NO MICROSCOPIC COLITIS, ACTIVE INFLAMMATION, CHRONIC CHANGES OR GRANULOMAS.    08/06/2017 Procedure   Colonoscopy by Dr. Nandigam on 08/06/17 IMPRESSION - Diverticulosis in the sigmoid colon and in the descending colon. - Non-bleeding internal hemorrhoids. - The entire examined colon is normal. Biopsied.    12/20/2017 Mammogram   There are stable lumpectomy changes in the upper-outer right breast. No mass, nonsurgical distortion, or suspicious microcalcification is identified on the right to suggest malignancy.  There is subtle palpable thickening in the 12 o'clock to 1 o'clock left breast just superior to the nipple.   IMPRESSION: Architectural distortion in the 12 o'clock region of the left breast. Malignancy, postsurgical scarring, or complex sclerosing lesion are considerations.   12/20/2017 Breast US   Targeted ultrasound is performed, showing a hypoechoic irregular mass with shadowing at 12 o'clock position 1-2 cm from the nipple measuring approximately 1.3 x 1.6 x 0.7 cm. Ultrasound of the region of patient concern discovered today while she was here in the office at 7:30 position shows normal fatty tissue.   12/23/2017 Procedure   Clip placement FINDINGS: Mammographic images were obtained following stereotactic guided biopsy of left breast distortion. The coil shaped biopsy marking clip is well positioned at the site of distortion in the upper slightly inner quadrant of the left breast.    12/23/2017 Pathology Results   Diagnosis Breast, left, needle core biopsy, upper inner quadrant - DENSE FIBROSIS AND PIGMENTED HISTIOCYTES CONSISTENT WITH PREVIOUS SURGICAL SITE - FOCAL FIBROCYSTIC CHANGES - NO MALIGNANCY IDENTIFIED      CURRENT THERAPY:  Letrozole started in 08/2014 and due to poor tolerance switched to Anastrozole, put on hold due to elevated liver enzyme since 05/21/2017 and restarted on 06/26/17  INTERVAL HISTORY: ***   Sarah Mata is here for a follow up of right breast cancer. She was last seen by me 1 year ago and seen by NP Lacie 6 months ago in  interim. She presents to the clinic alone.    REVIEW OF SYSTEMS:  *** Constitutional: Denies fevers, chills or abnormal weight loss Eyes: Denies blurriness of vision Ears, nose, mouth, throat, and face: Denies mucositis or sore throat Respiratory: Denies cough, dyspnea or wheezes Cardiovascular: Denies palpitation, chest discomfort or lower extremity swelling Gastrointestinal:  Denies nausea, heartburn or change in bowel habits Skin: Denies abnormal skin rashes Lymphatics: Denies new lymphadenopathy or easy bruising Neurological:Denies numbness, tingling or new weaknesses Behavioral/Psych: Mood is stable, no new changes  All other systems were reviewed with the patient and are negative.  MEDICAL HISTORY:  Past Medical History:  Diagnosis Date  . Allergy   . Arthritis   . Breast cancer ([place])   . Heart murmur   . Lupus ([place])   . Personal history of chemotherapy   . Personal history of radiation therapy   . Tremor of right hand 08/02/2017    SURGICAL HISTORY: Past Surgical History:  Procedure Laterality Date  . BREAST BIOPSY    . BREAST LUMPECTOMY Right 2015  . Broken Nose     Patient fell.  Marland Kitchen FOOT SURGERY Right 2010  . REDUCTION MAMMAPLASTY Bilateral 2015    I have reviewed the social history and family history with the patient and they are unchanged from previous note.  ALLERGIES:  is allergic to tetracyclines & related.  MEDICATIONS:  No current facility-administered medications for this visit.   Current Outpatient Medications  Medication Sig Dispense Refill  . anastrozole (ARIMIDEX) 1 MG tablet Take 1 tablet (1 mg total) by mouth daily. 90 tablet 3  . Ascorbic Acid (VITAMIN C) 1000 MG tablet Take 1,000 mg by mouth daily.    . carbidopa-levodopa (SINEMET IR) 25-100 MG tablet TAKE 1 TABLET THREE TIMES A DAY (9A.M., 1 P.M., 5 P.M.) 270 tablet 0  . cephALEXin (KEFLEX) 500 MG capsule Take 1 capsule (500 mg total) by mouth 4 (four) times daily. 20 capsule 0  .  Cholecalciferol (VITAMIN D3) 2000 units capsule Take 5,000 Units by mouth daily.     . Coenzyme Q10 (CO Q-10) 100 MG CAPS Take by mouth. Two caps daily    . UNABLE TO FIND 1 MD Bone builder; 1 in Am, and 1 in PM     Facility-Administered Medications Ordered in Other Visits  Medication Dose Route Frequency Provider Last Rate Last Admin  . anastrozole (ARIMIDEX) tablet 1 mg  1 mg Oral Daily Lajean Saver, MD   1 mg at 01/20/21 1017  . carbidopa-levodopa (SINEMET IR) 25-100 MG per tablet immediate release 1 tablet  1 tablet Oral 3 times per day Lajean Saver, MD   1 tablet at 01/20/21 1440  . cephALEXin (KEFLEX) capsule 500 mg  500 mg Oral QID Lajean Saver, MD   500 mg at 01/20/21 1440    PHYSICAL EXAMINATION: ECOG PERFORMANCE STATUS: {CHL ONC ECOG [phone]}  There were no vitals filed for this visit. There were no vitals filed for this visit. *** GENERAL:alert, no distress and comfortable SKIN: skin color, texture, turgor are normal, no rashes or significant lesions EYES: normal, Conjunctiva are pink and non-injected, sclera clear {OROPHARYNX:no exudate, no erythema and lips, buccal mucosa, and tongue normal}  NECK: supple, thyroid normal size, non-tender, without nodularity LYMPH:  no palpable lymphadenopathy in the cervical, axillary {or inguinal} LUNGS:  clear to auscultation and percussion with normal breathing effort HEART: regular rate & rhythm and no murmurs and no lower extremity edema ABDOMEN:abdomen soft, non-tender and normal bowel sounds Musculoskeletal:no cyanosis of digits and no clubbing  NEURO: alert & oriented x 3 with fluent speech, no focal motor/sensory deficits  LABORATORY DATA:  I have reviewed the data as listed CBC Latest Ref Rng & Units 01/18/2021 07/27/2020 07/11/2020  WBC 4.0 - 10.5 K/uL 6.5 5.2 5.9  Hemoglobin 12.0 - 15.0 g/dL 13.6 13.5 14.5  Hematocrit 36.0 - 46.0 % 39.7 38.8 43.0  Platelets 150 - 400 K/uL 144(L) 180 171     CMP Latest Ref Rng & Units  01/18/2021 07/27/2020 07/12/2020  Glucose 70 - 99 mg/dL 100(H) 92 104(H)  BUN 8 - 23 mg/dL _0 Creatinine 0.44 - 1.00 mg/dL 1.03(H) 0.87 0.96(H)  Sodium 135 - 145 mmol/L 125(L) 125(L) 130(L)  Potassium 3.5 - 5.1 mmol/L 4.3 3.8 4.3  Chloride 98 - 111 mmol/L 87(L) 88(L) 95(L)  CO2 22 - 32 mmol/L _1 Calcium 8.9 - 10.3 mg/dL 10.1 10.0 9.6  Total Protein 6.5 - 8.1 g/dL 7.6 7.5 -  Total Bilirubin 0.3 - 1.2 mg/dL 1.5(H) 0.7 -  Alkaline Phos 38 - 126 U/L 99 116 -  AST 15 - 41 U/L 34 20 -  ALT 0 - 44 U/L 12 <6 -      RADIOGRAPHIC STUDIES: I have personally reviewed the radiological images as listed and agreed with the findings in the report. CT Head Wo Contrast  Result Date: 01/19/2021 CLINICAL DATA:  Change in mental status EXAM: CT HEAD WITHOUT CONTRAST TECHNIQUE: Contiguous axial images were obtained from the base of the skull through the vertex without intravenous contrast. COMPARISON:  None. FINDINGS: Brain: No evidence of acute territorial infarction, hemorrhage, hydrocephalus,extra-axial collection or mass lesion/mass effect. There is dilatation the ventricles and sulci consistent with age-related atrophy. Low-attenuation changes in the deep white matter consistent with small vessel ischemia. Vascular: No hyperdense vessel or unexpected calcification. Skull: The skull is intact. No fracture or focal lesion identified. Sinuses/Orbits: The visualized paranasal sinuses and mastoid air cells are clear. The orbits and globes intact. Other: None IMPRESSION: No acute intracranial abnormality. Findings consistent with age related atrophy and chronic small vessel ischemia Electronically Signed   By: Prudencio Pair M.D.   On: 01/19/2021 01:08   DG Chest Portable 1 View  Result Date: 01/18/2021 CLINICAL DATA:  Altered mental status EXAM: PORTABLE CHEST 1 VIEW COMPARISON:  12/23/2019 FINDINGS: Elevation of left diaphragm with air distended bowel beneath the diaphragm. Rightward displacement of  cardiomediastinal contents. Aortic atherosclerosis. Increased bibasilar airspace disease. No pneumothorax IMPRESSION: Elevation of the left diaphragm with air distended bowel beneath the diaphragm. Increased bibasilar airspace disease, atelectasis versus pneumonia. Electronically Signed   By: Donavan Foil M.D.   On: 01/18/2021 22:13     ASSESSMENT & PLAN:  Cecia Egge is a 80 y.o. female with    1. Right breast cancer stage IIA, ER+/PR+/HER2-, mammaprint high risk -She was diagnosed in 02/2014. She is s/p rightlumpectomyand adjuvant radiation. She startedadjuvant letrozolein 08/2014, tolerating well overall. -Wepreviouslydiscussed the risk of cancer recurrence. She has high risk for recurrence based on the mammaprint result. -Due to shaking of right hand, she is very concerned this could be side effect of the letrozole. I switched herto Anastrozole 74m to take once dailyin 05/2017.Plan to complete 7 years in mid-late 2022if she can tolerate well.  -She is  clinically stable, tolerating anastrozole well.  We will continue.  -Lab and follow-up in 6 months.  2. Osteoporosis -Ipreviouslydiscussed the potential osteopenia and osteoporosis from aromatase inhibitor -Her 12/2018 DEXA showsosteoporosiswith lowest T-score -2.6 at forearm radius -Again discussed the benefit and potential side effect of aromatase inhibitor, and encouraged her to consider.  I discussed option of oral Fosamax, Prolia injection, or Zometa infusion.  Side effects especially risk of jaw necrosis, bone pain, fatigue were discussed with her.  After extensive discussion, she is willing to try.  She is going to see her dentist next month, I called in Fosamax to her pharmacy today. -I encouraged her to continuecalcium and vitamin D and to avoid fall. Given her Parkinson's I suggest she use cane or walker if needed.   3. Transaminates -this was discovered on routine lab work on 05/21/2017. PriorCT abdomen was  negative  -Her husband had chronic hepatitis, and passed away in 12-21-10, I recommend her to check for hepB and C -autoimmune hepatitis is also possible due to her lupus.  -she had liver work up by Dr. Marcene Brawn had a normal CT and colonoscopy -resolved   4. Lupus -She will continue f/u with Dr. Estanislado Pandy  5. Parkinson's Disease  -Followed by Dr. Carles Collet, patient declined carbidopa-levodopa; opted for natural remedies including physical and mental exercise -She is currently on observation, stable.  -She does not ambulate in straight line. She will see Dr. Carles Collet in Fall 2020   PLAN Continueanastrozole  I called in Fosamax today, she will see her dentist next months and start after the visit Lab and f/u in 6 months, she prefers in person visit, not virtual visit    No problem-specific Assessment & Plan notes found for this encounter.   No orders of the defined types were placed in this encounter.  All questions were answered. The patient knows to call the clinic with any problems, questions or concerns. No barriers to learning was detected. The total time spent in the appointment was {CHL ONC TIME VISIT - [id number]}.     Sarah Mata 01/20/2021   Oneal Deputy, am acting as scribe for Truitt Merle, MD.   {Add scribe attestation statement}

## 2021-01-20 NOTE — ED Notes (Signed)
[facility] contacted for transport to [facility]F

## 2021-01-20 NOTE — Progress Notes (Addendum)
437 pm TOC CM received message back from [facility], they have accepted for rehab. Call report to [phone] or [phone] room 19B. Faxed requested documents to [place] [facility] for Level II PASRR. Faxed AVS and COVID results to [employer]. PTAR called.  Jonnie Finner RN CCM, [facility] ED TOC CM [phone]   656 pm TOC CM contacted pt's sister with information on facility. Provided Address and contact number. States they have two appts with Memory Care facilities in [place] on 01/28/2021. They will arrange for her a PCP in [place]. She has spoke to an attorney to assist with Healthcare and Durable POA. [place], [place] ED TOC CM [phone]

## 2021-01-20 NOTE — Progress Notes (Signed)
Sarah Mata "Sarah Mata" MRN: [id number] 01/18/2021 [place] DEPT [phone] [facility] "Sarah Mata" (MRN: [id number]) . Printed at 01/20/2021 5:02 PM Page 1 of 11 AFTER VISIT SUMMARY ...... See your updated medication list for details. Urinary Tract Infection Adult [place]) Specialty: Family Medicine Contact: [place] [address] [phone] You were seen by Provider Default, MD and Dewaine Oats, PA-C Altered Mental Status Confusion Combative behavior Paranoia ([place]) Acute UTI History of Parkinson's disease Hyponatremia CBC with Differential Comprehensive metabolic panel Resp Panel by RT-PCR (Flu A&B, Covid) Nasopharyngeal Swab Urinalysis, Microscopic (reflex) Urinalysis, Routine w reflex microscopic Urine culture Your medications have changed today Read the attached information Pick up these medications from any pharmacy with your printed prescription cephALEXin Schedule an appointment with Orma Flaming, MD as soon as possible for a visit Reason for Visit Diagnoses Lab Tests Completed Today's Visit Instructions Sarah Mata "Sarah Mata" (MRN: [id number]) . Printed at 01/20/2021 5:02 PM Page 2 of 11 Today's Visit (continued) CT Head Wo Contrast DG Chest Portable 1 View ED EKG Check temperature Offer Fluids PT PLAN OF CARE CERT/RE-CERT anastrozole (ARIMIDEX) Last given 01/20/2021 10:17 AM carbidopa-levodopa (SINEMET IR) Last given 01/20/2021 2:40 PM cephALEXin (KEFLEX) Last given 01/20/2021 2:40 PM sodium chloride Stopped 01/19/2021 3:09 PM sodium chloride Stopped 01/19/2021 2:06 AM sterile water (preservative free) Last given 01/18/2021 10:31 PM ziprasidone (GEODON) Last given 01/18/2021 11:45 PM ziprasidone (GEODON) Last given 01/18/2021 10:31 PM Jan 25 2021 LAB MO Wednesday March 9 1:45 PM [place] [id number] [place]  [id number] [phone] [facility] 9 2022 Established Patient 20 with Truitt Merle, MD Wednesday March 9 2:20 PM [facility] Medical Oncology [place] [id number] [place] [address] [phone] [facility] 23 2022 Office Visit with Orma Flaming, MD Wednesday March 23 1:30 PM (Arrive by 1:15 PM) [place] [phone] Mar 01 2021 Telephone Call with Care Management Staff Wednesday April 13 1:00 PM (Arrive by 12:45 PM) [place] [phone] Arrive at: PHONE [place]- Provider will call you Apr 12 2021 ROUTINE FOOT CARE with Marzetta Board, [facility] Wednesday May 25 4:15 PM [facility] [place], [place] [address] [phone] Imaging Tests Done Today Medications Given Please arrive 15 minutes prior to your appointment. This will allow [place] to verify and update your medical record and ensure a full appointment for you within the time allotted. THIS VISIT IS PHONE CALL ONLY. Kindly Note - if you are scheduled for a phone call, we may call you at any time during the day on which the call is scheduled. If we are unable to reach you, we will call you again OR you may return a call to [place] at your convenience. What's Next [facility] "Sarah Arias" (MRN: [id number]) . Printed at 01/20/2021 5:02 PM Page 3 of 11 What's Next (continued) Showing your appointments through Apr 12, 2021. You have more appointments scheduled after these. We now offer e-Visits for anyone 63 and older to request care online for non-urgent symptoms. For details visit [email or web address]. Also download the MyChart app! Go to the app store, search "MyChart", open the app, select [facility], and log in with your MyChart username and password. Allergen Reactions Tetracyclines & Related Other (See  Comments) Stomach burning Orma Flaming, MD You are allergic to the  following Your Primary Care Provider You are allergic to the following MyChart [facility] "Sarah Hammett (MRN: [id number]) . Printed at 01/20/2021 5:02 PM Page 4 of 11 Your Medication List cephALEXin 500 MG capsule Commonly known as: KEFLEX Take 1 capsule (500 mg total) by mouth 4 (four) times daily. anastrozole 1 MG tablet Commonly known as: ARIMIDEX Take 1 tablet (1 mg total) by mouth daily. carbidopa-levodopa 25-100 MG tablet Commonly known as: SINEMET IR TAKE 1 TABLET THREE TIMES A DAY (9A.M., 1 P.M., 5 P.M.) Co Q-10 100 MG Caps Take by mouth. Two caps daily UNABLE TO FIND 1 MD Bone builder; 1 in Am, and 1 in PM vitamin C 1000 MG tablet Take 1,000 mg by mouth daily. Vitamin D3 50 MCG (2000 UT) capsule Take 5,000 Units by mouth daily. TAASKKE y tohuers de omcteodri caabtoiounts these medications ASK your doctor about these medications TAKE these medications Sarah Mata "Sarah Mata" (MRN: [id number]) . Printed at 01/20/2021 5:02 PM Page 5 of 11 ... ??? .... ??? Urinary Tract Infection, Adult A urinary tract infection (UTI) is an infection of any part of the urinary tract. The urinary tract includes the kidneys, ureters, bladder, and urethra. These organs make, store, and get rid of urine in the body. An upper UTI affects the ureters and kidneys. A lower UTI affects the bladder and urethra. What are the causes? Most urinary tract infections are caused by bacteria in your genital area around your urethra, where urine leaves your body. These bacteria grow and cause inflammation of your urinary tract. What increases the risk? You are more likely to develop this condition if: You have a urinary catheter that stays in place. You are not able to control when you urinate or have a bowel movement (incontinence). You are female and you: Use a spermicide or diaphragm for birth control. Have low estrogen  levels. Are pregnant. You have certain genes that increase your risk. You are sexually active. You take antibiotic medicines. You have a condition that causes your flow of urine to slow down, such as: An enlarged prostate, if you are female. Blockage in your urethra. A kidney stone. Attached Information Urinary Tract Infection Adult (English) Sarah Mata "Sarah Mata" (MRN: [id number]) . Printed at 01/20/2021 5:02 PM Page 6 of 11 ?? . ????? ......... ..... ... .. A nerve condition that affects your bladder control (neurogenic bladder). Not getting enough to drink, or not urinating often. You have certain medical conditions, such as: Diabetes. A weak disease-fighting system (immunesystem). Sickle cell disease. Gout. Spinal cord injury. What are the signs or symptoms? Symptoms of this condition include: Needing to urinate right away (urgency). Frequent urination. This may include small amounts of urine each time you urinate. Pain or burning with urination. Blood in the urine. Urine that smells bad or unusual. Trouble urinating. Cloudy urine. Vaginal discharge, if you are female. Pain in the abdomen or the lower back. You may also have: Vomiting or a decreased appetite. Confusion. Irritability or tiredness. A fever or chills. Diarrhea. The first symptom in older adults may be confusion. In some cases, they may not have any symptoms until the infection has worsened. How is this diagnosed? This condition is diagnosed based on your medical history and a physical exam. You may also have other tests, including: Urine tests. Blood tests. Tests for STIs (sexually transmitted infections). If you have had more than one UTI, a cystoscopy or imaging studies may be done to determine the cause of the infections. How is  this treated? Treatment for this condition includes: Antibiotic medicine. Over-the-counter medicines to treat discomfort. [facility] "Sarah Mata" (MRN:  [id number]) . Printed at 01/20/2021 5:02 PM Page 7 of 11 . .. ... . .. Drinking enough water to stay hydrated. If you have frequent infections or have other conditions such as a kidney stone, you may need to see a health care provider who specializes in the urinary tract (urologist). In rare cases, urinary tract infections can cause sepsis. Sepsis is a life-threatening condition that occurs when the body responds to an infection. Sepsis is treated in the hospital with IV antibiotics, fluids, and other medicines. Contact a health care provider if: Your symptoms do not get better after 1-2 days. Your symptoms go away and then return. Get help right away if: You have severe pain in your back or your lower abdomen. You have a fever or chills. You have nausea or vomiting. Summary A urinary tract infection (UTI) is an infection of any part of the urinary tract, which includes the kidneys, ureters, bladder, and urethra. Most urinary tract infections are caused by bacteria in your genital area. Treatment for this condition often includes antibiotic medicines. . . . ? ?? .. Follow these instructions at home: Medicines Take over-the-counter and prescription medicines only as told by your health care provider. If you were prescribed an antibiotic medicine, take it as told by your health care provider. Do not stop using the antibiotic even if you start to feel better. General instructions Make sure you: Empty your bladder often and completely. Do not hold urine for long periods of time. Empty your bladder after sex. Wipe from front to back after urinating or having a bowel movement if you are female. Use each tissue only one time when you wipe. Drink enough fluid to keep your urine pale yellow. Keep all follow-up visits. This is important. [facility] "Sarah Mata" (MRN: [id number]) . Printed at 01/20/2021 5:02 PM Page 8 of 11 . Marland Kitchen If you were prescribed an antibiotic medicine, take  it as told by your health care provider. Do not stop using the antibiotic even if you start to feel better. Keep all follow-up visits. This is important. This information is not intended to replace advice given to you by your health care provider. Make sure you discuss any questions you have with your health care provider. Document Revised: 06/17/2020 Document Reviewed: 06/17/2020 Elsevier Patient Education  2021 [employer]. Thank you for choosing [facility] for your medical care today. The doctors, nurses and all of the staff strive to provide you and your family members exceptional care during these trying circumstances. Emergency department and Urgent Care patients are not always seen by their assigned attending doctors. Some patients are only seen by an advanced practice provider. However, an attending physician is always available for review and involvement in your care, even if he or she doesn't personally see you during your visit. Seek immediate medical care for severe or worsening symptoms. For urgent medical matters, contact your doctor's office. If you are experiencing a true medical emergency, go to the Emergency Department or call 911. Emergency Departments provide medical screening exams and initial stabilizing treatment of emergency medical conditions. Urgent Cares provide treatment for illnesses and injuries that require medical attention but are not lifethreatening. Medicine is an Financial trader and many conditions cannot be diagnosed or completely treated during a single ED or UC visit. Your treating healthcare provider(s) today feel your condition has been stabilized so further care  as an outpatient is reasonable. Emergency or Urgent care does not substitute for complete, ongoing, or follow-up care by your primary care physician or consultant. Your medication list was reviewed prior to treatment and again at discharge by the treating provider for the purpose of this  outpatient visit only. Please review this entire medication list with your pharmacist, primary care physicians, and specialist(s). It is your responsibility to share any new medication instructions you received this visit with your doctor(s). Although no medicine is without risk, your healthcare provider today feels reasonable decisions were made concerning starting new medications and stopping or changing the dosages of your usual medications until you receive follow-up care. Take medications only as directed. Many medications can cause drowsiness, especially those for pain, anxiety, muscle spasms, nausea, and allergies. DO NOT drive, drink alcohol, operate power machinery, or participate in potentially dangerous activities if taking medicines that make you tired. If you use tobacco products, drink alcohol to excess, use street drugs, and/or abuse controlled substances, you are advised to discontinue and seek professional assistance in quitting. To Find a Primary Care or Specialty Doctor: Call [phone] or [phone] to access [place] Doctor service. Or [email or web address] Additional Information Sarah Mata "Sarah Mata (MRN: [id number]) . Printed at 01/20/2021 5:02 PM Page 9 of 11 Additional Information (continued) .. . If your blood pressure was elevated above 135/85 this visit, you should have it repeated at your doctor within one month or as otherwise directed. If you were prescribed antibiotics and take birth control medicine, know that the birth control may not work due to the antibiotics. If you were prescribed antibiotics and take Coumadin/Warfarin, know that your blood may become even thinner while on antibiotics, so contact your doctor as soon as possible to determine if your Coumadin dose needs adjustment. Know Your Options for fast and affordable healthcare options: Visit TrustyToys.ca National Suicide Prevention Lifeline -  (540) 875-8898 Free, confidential support and resources WHAT YOU NEED TO KNOW ABOUT ANTIBIOTICS 5 things you need to know about antibiotics 1. Antibiotics are drugs that kill bacteria. Using antibiotics when they are not needed may make bacteria become resistant. This means the bacteria learn how to stop antibiotics from being able to kill them or stop their growth. At [facility], we give antibiotics only when they are needed. This helps lower the chance of bacteria becoming resistant. 2. Antibiotics aren't for every sickness because some illnesses are caused by viruses. Antibiotics cannot kill a virus. A lab test is done to see if you are sick from bacteria. It can take up to 3 days to know if bacteria are making you sick. 3. Antibiotics are given for the shortest time needed. If your lab test does not show bacteria, your doctor may stop your antibiotic. If your lab test does show bacteria, your doctor may change your antibiotic. You will get the right drug to kill the identified bacteria. If you have any antibiotics leftover, throw them away. Also, never take antibiotics prescribed for someone else. 4. Antibiotics can cause side effects. Seek medical care and stop taking the antibiotics if you have signs of an allergic reaction like an itchy rash. Seek immediate medical care if you have trouble breathing, throat swelling, or have severe diarrhea and abdominal cramps. Talk to your doctor if you have loose bowel movements or are throwing up. 5. What if I miss a dose of my antibiotics? Take it as soon as possible. If you are taking 2 doses per day, take the  missed dose and the next dose at least 6 hours later. If you are taking 3 doses per day, take the missed dose and the next dose at least 4 hours later. Antibiotic Education [facility] "Sarah Mata" (MRN: [id number]) . Printed at 01/20/2021 5:02 PM Page 10 of 11 Antibiotic Education (continued) Reviewed and Endorsed by Patient  Education Committee, Feb, 2017 Advanced Directive Information During your stay with [place] you indicated you do not have an Advanced Directive or would like more information regarding Advanced Directives. [facility] offers free advance directive forms, as well as assistance in completing the forms themselves. For assistance, contact the Spiritual Care Department at [phone], or the Clinical Social Work Department at [phone]. For additional information please visit these websites: www.tryemmi.com Use Code: [facility] and Search: Advance Directives then Click view to watch an informative video about Advanced Directives. [email or web address] Uses interactive videos to assist you in preparing your Advanced Directive. To access a blank form to fill out please visit: [email or web address] Care Everywhere ID: [id number] Important Information about Advanced Directives [facility] "Sarah Mata (MRN: [id number]) . Printed at 01/20/2021 5:02 PM Page 11 of 11 If your urine culture is positive and you will need a change in therapy, you will get a phone call from [facility] regarding your results and changes in your therapy. If your urine culture is negative or does not require any changes in your therapy, you will not get a phone call from [facility] with your results. You may view your results on MyChart. If you do not have a MyChart account, sign up instructions are in your discharge papers.

## 2021-01-20 NOTE — ED Notes (Signed)
Alert to self and place. Appears delusional about health condition as she believes that she should not urinate too much and is restricting her liquid for that reason. Believes that she has cancer all over because of urinary problem. Will not drink cranberry juice because of the sugar in it and did not eat breakfast for similar reasons. She is soft spoken and cooperative. Took pills.

## 2021-01-20 NOTE — NC FL2 (Signed)
[place] LEVEL OF CARE SCREENING TOOL     IDENTIFICATION  Patient Name: Sarah Mata Birthdate: [birth year] Sex: female Admission Date (Current Location): 01/18/2021  [facility] [place] Number:  Herbalist and Address:  [facility],  [place] [address] [place]      Provider Number: [id number]  Attending Physician Name and Address:  Default, Provider, MD  Relative Name and Phone Number:  Jude Cervellera [phone]    Current Level of Care: Hospital Recommended Level of Care: [place] Prior Approval Number:    Date Approved/Denied:   PASRR Number:  (pending)  Discharge Plan: SNF    Current Diagnoses: Patient Active Problem List   Diagnosis Date Noted  . Benign essential HTN 07/11/2020  . Hyperlipidemia 12/28/2019  . Parkinson's disease ([place]) 12/23/2019  . 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 Eustachian tube dysfunction 09/16/2017  . Atherosclerosis of aorta ([place]) 09/16/2017  . Systemic lupus erythematosus ([place]) 09/16/2017  . Chronic diarrhea 06/15/2017  . Vitamin D deficiency 05/21/2017  . Cancer of right breast, stage 2 ([place]) 03/28/2017    Orientation RESPIRATION BLADDER Height & Weight     Self,Situation  Normal Continent Weight: 76.2 kg Height:  5\' 3"  (160 cm)  BEHAVIORAL SYMPTOMS/MOOD NEUROLOGICAL BOWEL NUTRITION STATUS      Continent Diet (regular)  AMBULATORY STATUS COMMUNICATION OF NEEDS Skin   Limited Assist Verbally Normal                       Personal Care Assistance Level of Assistance  Bathing,Dressing,Feeding Bathing Assistance: Limited assistance Feeding assistance: Independent Dressing Assistance: Limited assistance     Functional Limitations Info  Sight,Hearing,Speech Sight Info: Adequate Hearing Info: Adequate Speech Info: Adequate    SPECIAL CARE FACTORS FREQUENCY  PT (By licensed PT),OT (By licensed OT)     PT Frequency: 5x per week OT  Frequency: 5x per week            Contractures Contractures Info: Present    Additional Factors Info  Code Status,Allergies Code Status Info: Full Code Allergies Info: Tetracyclines & Related           Current Medications (01/20/2021):  This is the current hospital active medication list Current Facility-Administered Medications  Medication Dose Route Frequency Provider Last Rate Last Admin  . anastrozole (ARIMIDEX) tablet 1 mg  1 mg Oral Daily Lajean Saver, MD   1 mg at 01/20/21 1017  . carbidopa-levodopa (SINEMET IR) 25-100 MG per tablet immediate release 1 tablet  1 tablet Oral 3 times per day Lajean Saver, MD   1 tablet at 01/20/21 1018  . cephALEXin (KEFLEX) capsule 500 mg  500 mg Oral QID Lajean Saver, MD   500 mg at 01/20/21 1017   Current Outpatient Medications  Medication Sig Dispense Refill  . anastrozole (ARIMIDEX) 1 MG tablet Take 1 tablet (1 mg total) by mouth daily. 90 tablet 3  . Ascorbic Acid (VITAMIN C) 1000 MG tablet Take 1,000 mg by mouth daily.    . carbidopa-levodopa (SINEMET IR) 25-100 MG tablet TAKE 1 TABLET THREE TIMES A DAY (9A.M., 1 P.M., 5 P.M.) 270 tablet 0  . Cholecalciferol (VITAMIN D3) 2000 units capsule Take 5,000 Units by mouth daily.     . Coenzyme Q10 (CO Q-10) 100 MG CAPS Take by mouth. Two caps daily    . UNABLE TO FIND 1 MD Bone builder; 1 in Am, and 1 in PM  Discharge Medications: Please see discharge summary for a list of discharge medications.  Relevant Imaging Results:  Relevant Lab Results:   Additional Information SS# [id number]  Erenest Rasher, RN

## 2021-01-20 NOTE — Progress Notes (Signed)
TOC CM/CSW advised Jude/pts sister to begin searching for a placement for pt.  This would be best done without the hospitals assistance, due to pts combative state.  Jude will be by to visit sister once her plane lands from [place].  CSW will continue to follow for dc needs.  Treesa Mccully Tarpley-Carter, MSW, LCSW-A Pronouns:  She, Her, [place] ED Transitions of CareClinical Social Worker [email or web address] [phone]

## 2021-01-20 NOTE — Progress Notes (Signed)
TOC CM/CSW reached out to Dr. Christianne Dolin PCP.  CSW reached out to Dr. Rogers Blocker on behalf of Sarah Mata/pts sister.  Sarah is concerned with pts needs and would like Dr. Shelby Mattocks advice.  CSW will continue to follow for dc needs.  Sarah Mata, MSW, LCSW-A Pronouns:  She, Her, Hers                  [place] ED Transitions of CareClinical Social Worker [email or web address] [phone]

## 2021-01-21 DIAGNOSIS — R410 Disorientation, unspecified: Secondary | ICD-10-CM | POA: Diagnosis not present

## 2021-01-21 DIAGNOSIS — R4182 Altered mental status, unspecified: Secondary | ICD-10-CM | POA: Diagnosis not present

## 2021-01-21 DIAGNOSIS — N390 Urinary tract infection, site not specified: Secondary | ICD-10-CM | POA: Diagnosis not present

## 2021-01-21 DIAGNOSIS — M6281 Muscle weakness (generalized): Secondary | ICD-10-CM | POA: Diagnosis not present

## 2021-01-21 DIAGNOSIS — E871 Hypo-osmolality and hyponatremia: Secondary | ICD-10-CM | POA: Diagnosis not present

## 2021-01-21 DIAGNOSIS — R278 Other lack of coordination: Secondary | ICD-10-CM | POA: Diagnosis not present

## 2021-01-21 DIAGNOSIS — H903 Sensorineural hearing loss, bilateral: Secondary | ICD-10-CM | POA: Diagnosis not present

## 2021-01-21 DIAGNOSIS — M3210 Systemic lupus erythematosus, organ or system involvement unspecified: Secondary | ICD-10-CM | POA: Diagnosis not present

## 2021-01-21 DIAGNOSIS — G20 Parkinson's disease: Secondary | ICD-10-CM | POA: Diagnosis not present

## 2021-01-21 DIAGNOSIS — Z7401 Bed confinement status: Secondary | ICD-10-CM | POA: Diagnosis not present

## 2021-01-21 DIAGNOSIS — I10 Essential (primary) hypertension: Secondary | ICD-10-CM | POA: Diagnosis not present

## 2021-01-21 DIAGNOSIS — E785 Hyperlipidemia, unspecified: Secondary | ICD-10-CM | POA: Diagnosis not present

## 2021-01-21 DIAGNOSIS — M2550 Pain in unspecified joint: Secondary | ICD-10-CM | POA: Diagnosis not present

## 2021-01-21 DIAGNOSIS — C50911 Malignant neoplasm of unspecified site of right female breast: Secondary | ICD-10-CM | POA: Diagnosis not present

## 2021-01-21 DIAGNOSIS — F0390 Unspecified dementia without behavioral disturbance: Secondary | ICD-10-CM | POA: Diagnosis not present

## 2021-01-23 ENCOUNTER — Telehealth: Payer: Self-pay

## 2021-01-23 NOTE — Telephone Encounter (Signed)
Pt.'s sister called in stating pt was in the hospital and is delusional. Pt.'s sister is requesting a visit with Dr. Rogers Blocker today if possible about becoming POA. Sister leaves tomorrow to go back to [place], where she lives.

## 2021-01-23 NOTE — Telephone Encounter (Signed)
Per Dr Rogers Blocker, pt is to get advise from a lawyer to help with a POA, she will call pt sister and discuss this with her. -JMA

## 2021-01-25 ENCOUNTER — Inpatient Hospital Stay: Payer: Medicare Other | Attending: Hematology | Admitting: Hematology

## 2021-01-25 ENCOUNTER — Inpatient Hospital Stay: Payer: Medicare Other

## 2021-01-25 DIAGNOSIS — C50911 Malignant neoplasm of unspecified site of right female breast: Secondary | ICD-10-CM

## 2021-01-27 ENCOUNTER — Telehealth: Payer: Self-pay | Admitting: Hematology

## 2021-01-27 NOTE — Telephone Encounter (Signed)
Called pt to schedule appt. No answer. Left vm to call back to schedule appt.

## 2021-01-30 DIAGNOSIS — I10 Essential (primary) hypertension: Secondary | ICD-10-CM | POA: Diagnosis not present

## 2021-01-30 DIAGNOSIS — G20 Parkinson's disease: Secondary | ICD-10-CM | POA: Diagnosis not present

## 2021-01-30 DIAGNOSIS — F0390 Unspecified dementia without behavioral disturbance: Secondary | ICD-10-CM | POA: Diagnosis not present

## 2021-02-08 ENCOUNTER — Ambulatory Visit: Payer: Medicare Other | Admitting: Family Medicine

## 2021-02-09 DIAGNOSIS — E559 Vitamin D deficiency, unspecified: Secondary | ICD-10-CM | POA: Diagnosis not present

## 2021-02-09 DIAGNOSIS — R600 Localized edema: Secondary | ICD-10-CM | POA: Diagnosis not present

## 2021-02-09 DIAGNOSIS — R5383 Other fatigue: Secondary | ICD-10-CM | POA: Diagnosis not present

## 2021-02-09 DIAGNOSIS — Z853 Personal history of malignant neoplasm of breast: Secondary | ICD-10-CM | POA: Diagnosis not present

## 2021-02-09 DIAGNOSIS — G20 Parkinson's disease: Secondary | ICD-10-CM | POA: Diagnosis not present

## 2021-02-09 DIAGNOSIS — R410 Disorientation, unspecified: Secondary | ICD-10-CM | POA: Diagnosis not present

## 2021-02-13 DIAGNOSIS — Z79899 Other long term (current) drug therapy: Secondary | ICD-10-CM | POA: Diagnosis not present

## 2021-02-13 DIAGNOSIS — N390 Urinary tract infection, site not specified: Secondary | ICD-10-CM | POA: Diagnosis not present

## 2021-02-13 DIAGNOSIS — E559 Vitamin D deficiency, unspecified: Secondary | ICD-10-CM | POA: Diagnosis not present

## 2021-02-16 DIAGNOSIS — R6889 Other general symptoms and signs: Secondary | ICD-10-CM | POA: Diagnosis not present

## 2021-02-16 DIAGNOSIS — Z853 Personal history of malignant neoplasm of breast: Secondary | ICD-10-CM | POA: Diagnosis not present

## 2021-02-16 DIAGNOSIS — G20 Parkinson's disease: Secondary | ICD-10-CM | POA: Diagnosis not present

## 2021-02-16 DIAGNOSIS — E538 Deficiency of other specified B group vitamins: Secondary | ICD-10-CM | POA: Diagnosis not present

## 2021-02-16 DIAGNOSIS — R600 Localized edema: Secondary | ICD-10-CM | POA: Diagnosis not present

## 2021-02-16 DIAGNOSIS — R410 Disorientation, unspecified: Secondary | ICD-10-CM | POA: Diagnosis not present

## 2021-02-16 DIAGNOSIS — E559 Vitamin D deficiency, unspecified: Secondary | ICD-10-CM | POA: Diagnosis not present

## 2021-03-01 ENCOUNTER — Telehealth: Payer: Medicare Other

## 2021-03-06 DIAGNOSIS — L84 Corns and callosities: Secondary | ICD-10-CM | POA: Diagnosis not present

## 2021-03-06 DIAGNOSIS — B351 Tinea unguium: Secondary | ICD-10-CM | POA: Diagnosis not present

## 2021-03-06 DIAGNOSIS — M79674 Pain in right toe(s): Secondary | ICD-10-CM | POA: Diagnosis not present

## 2021-03-06 DIAGNOSIS — I70203 Unspecified atherosclerosis of native arteries of extremities, bilateral legs: Secondary | ICD-10-CM | POA: Diagnosis not present

## 2021-03-06 DIAGNOSIS — M2041 Other hammer toe(s) (acquired), right foot: Secondary | ICD-10-CM | POA: Diagnosis not present

## 2021-03-16 DIAGNOSIS — G20 Parkinson's disease: Secondary | ICD-10-CM | POA: Diagnosis not present

## 2021-03-16 DIAGNOSIS — R410 Disorientation, unspecified: Secondary | ICD-10-CM | POA: Diagnosis not present

## 2021-03-16 DIAGNOSIS — E559 Vitamin D deficiency, unspecified: Secondary | ICD-10-CM | POA: Diagnosis not present

## 2021-03-16 DIAGNOSIS — M19171 Post-traumatic osteoarthritis, right ankle and foot: Secondary | ICD-10-CM | POA: Diagnosis not present

## 2021-03-16 DIAGNOSIS — M3219 Other organ or system involvement in systemic lupus erythematosus: Secondary | ICD-10-CM | POA: Diagnosis not present

## 2021-03-16 DIAGNOSIS — Z853 Personal history of malignant neoplasm of breast: Secondary | ICD-10-CM | POA: Diagnosis not present

## 2021-03-16 DIAGNOSIS — R030 Elevated blood-pressure reading, without diagnosis of hypertension: Secondary | ICD-10-CM | POA: Diagnosis not present

## 2021-03-16 DIAGNOSIS — R6889 Other general symptoms and signs: Secondary | ICD-10-CM | POA: Diagnosis not present

## 2021-03-16 DIAGNOSIS — E538 Deficiency of other specified B group vitamins: Secondary | ICD-10-CM | POA: Diagnosis not present

## 2021-03-16 DIAGNOSIS — R600 Localized edema: Secondary | ICD-10-CM | POA: Diagnosis not present

## 2021-03-28 ENCOUNTER — Other Ambulatory Visit: Payer: Self-pay | Admitting: Neurology

## 2021-03-30 DIAGNOSIS — I10 Essential (primary) hypertension: Secondary | ICD-10-CM | POA: Diagnosis not present

## 2021-03-30 DIAGNOSIS — R600 Localized edema: Secondary | ICD-10-CM | POA: Diagnosis not present

## 2021-03-30 DIAGNOSIS — G20 Parkinson's disease: Secondary | ICD-10-CM | POA: Diagnosis not present

## 2021-03-30 DIAGNOSIS — Z853 Personal history of malignant neoplasm of breast: Secondary | ICD-10-CM | POA: Diagnosis not present

## 2021-03-30 DIAGNOSIS — R410 Disorientation, unspecified: Secondary | ICD-10-CM | POA: Diagnosis not present

## 2021-03-30 DIAGNOSIS — M19171 Post-traumatic osteoarthritis, right ankle and foot: Secondary | ICD-10-CM | POA: Diagnosis not present

## 2021-03-30 DIAGNOSIS — E538 Deficiency of other specified B group vitamins: Secondary | ICD-10-CM | POA: Diagnosis not present

## 2021-03-30 DIAGNOSIS — R6889 Other general symptoms and signs: Secondary | ICD-10-CM | POA: Diagnosis not present

## 2021-03-30 DIAGNOSIS — M3219 Other organ or system involvement in systemic lupus erythematosus: Secondary | ICD-10-CM | POA: Diagnosis not present

## 2021-03-30 DIAGNOSIS — R030 Elevated blood-pressure reading, without diagnosis of hypertension: Secondary | ICD-10-CM | POA: Diagnosis not present

## 2021-03-30 DIAGNOSIS — E559 Vitamin D deficiency, unspecified: Secondary | ICD-10-CM | POA: Diagnosis not present

## 2021-04-03 ENCOUNTER — Telehealth: Payer: Self-pay

## 2021-04-03 NOTE — Chronic Care Management (AMB) (Signed)
    Chronic Care Management Pharmacy Assistant   Name: Sarah Mata  MRN: [id number] DOB: March 04, 1941  Reason for Encounter: General Adherence Call  Recent office visits:  No visits noted  Recent consult visits:  01/03/21- Acquanetta Sit, DP (Podiatry)- Seen for painful, thick toenails that are difficult to trim, debridement performed,no medication changes, follow up 3 months  Hospital visits:  Medication Reconciliation was completed by comparing discharge summary, patient's EMR and Pharmacy list, and upon discussion with patient.  Admitted to the hospital on 01/18/21 due to altered mental status. Discharge date was 01/21/21. Discharged from [facility].    Started cephalexin 500 mg due to UTI  All other medications remain the same.    Medications: Outpatient Encounter Medications as of 04/03/2021  Medication Sig Note  . anastrozole (ARIMIDEX) 1 MG tablet Take 1 tablet (1 mg total) by mouth daily. 01/19/2021: 90 day supply 01/16/2021  . Ascorbic Acid (VITAMIN C) 1000 MG tablet Take 1,000 mg by mouth daily.   . carbidopa-levodopa (SINEMET IR) 25-100 MG tablet TAKE 1 TABLET THREE TIMES A DAY (9A.M., 1 P.M., 5 P.M.) 01/19/2021: 90 day supply 12/28/2020  . cephALEXin (KEFLEX) 500 MG capsule Take 1 capsule (500 mg total) by mouth 4 (four) times daily.   . Cholecalciferol (VITAMIN D3) 2000 units capsule Take 5,000 Units by mouth daily.    . Coenzyme Q10 (CO Q-10) 100 MG CAPS Take by mouth. Two caps daily   . UNABLE TO FIND 1 MD Bone builder; 1 in Am, and 1 in PM    No facility-administered encounter medications on file as of 04/03/2021.   [name] returned my call and she has been doing well. She has moved to [place] with her sister and will no longer be needing services for CCM. Her transition there has been good even though she can't stay in the sun for long. I encouraged her to enjoy the beach at night and she seem delighted by the thought. She knows she has good  care and can rely on [place] for any concerns with transitioning.  [employer] CPA, CMA

## 2021-04-12 ENCOUNTER — Ambulatory Visit: Payer: Medicare Other | Admitting: Podiatry

## 2021-07-28 ENCOUNTER — Ambulatory Visit: Payer: Medicare Other

## 2021-08-07 ENCOUNTER — Telehealth: Payer: Self-pay | Admitting: *Deleted

## 2021-08-07 NOTE — Telephone Encounter (Signed)
Received call from pt requesting records to be sent to Fax [phone] in care of Carmen/Office Coordinator.  Called [facility] & was told to call [phone].  Notified pt to call them for records & the [place] office may need to send a release of information to them or if the PCP that she seen in [facility] has records already, they can send to Oncologist.  Hopefully [facility] team can help pt get records.

## 2022-01-28 IMAGING — MG DIGITAL DIAGNOSTIC BILAT W/ TOMO W/ CAD
6 series · 6 of 18 positions shown · non-contrast
Comparison: Previous exam(s).

CLINICAL DATA: 78-year-old female with history of right breast
cancer in 7701 status post lumpectomy and radiation. Patient had a
bilateral reduction mammoplasty in 7701 as well. History of a benign
left breast biopsy in 1987. Patient has a new palpable area of
concern "fullness" adjacent to her lumpectomy site in the right
breast.

EXAM:
DIGITAL DIAGNOSTIC BILATERAL MAMMOGRAM WITH CAD AND TOMO
ULTRASOUND RIGHT BREAST

[R CC synth-2D]
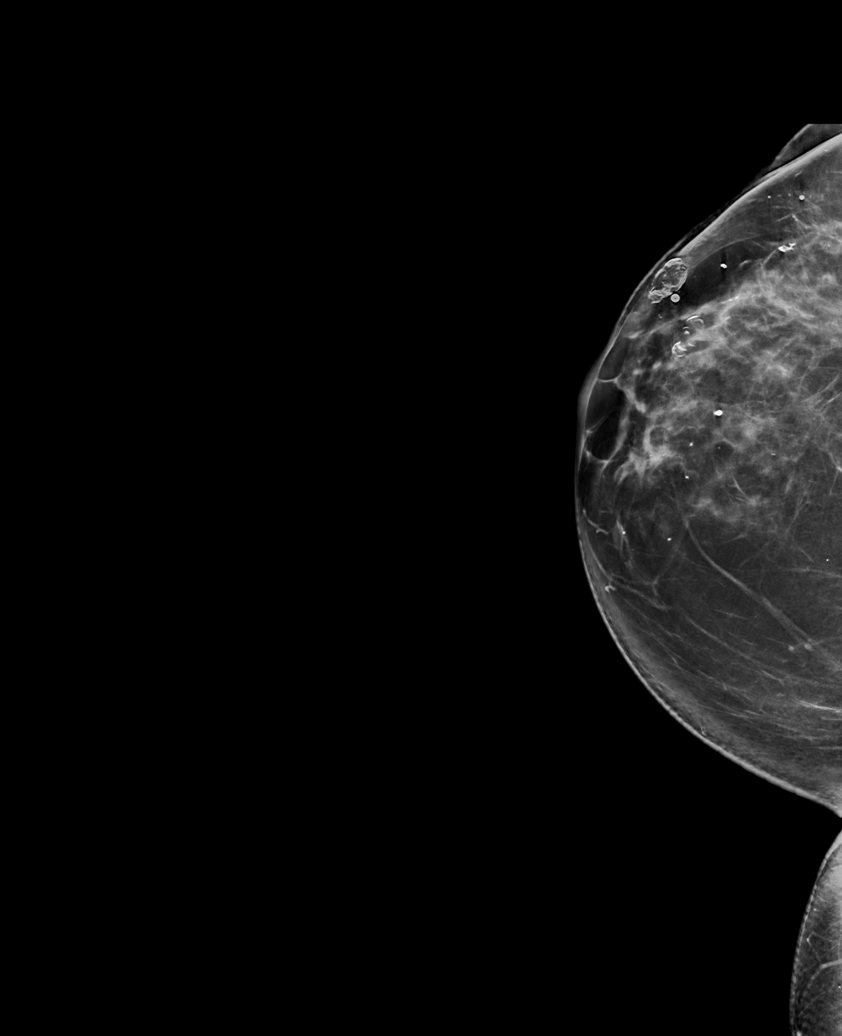

[R TAN synth-2D]
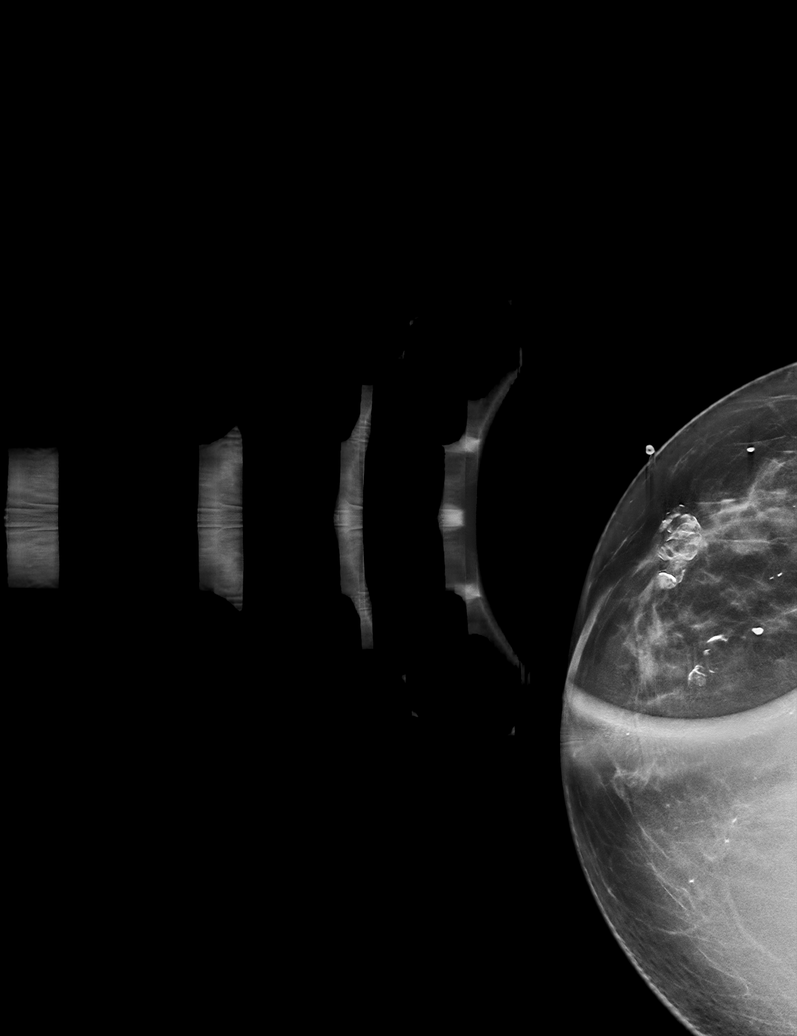

[L CC synth-2D]
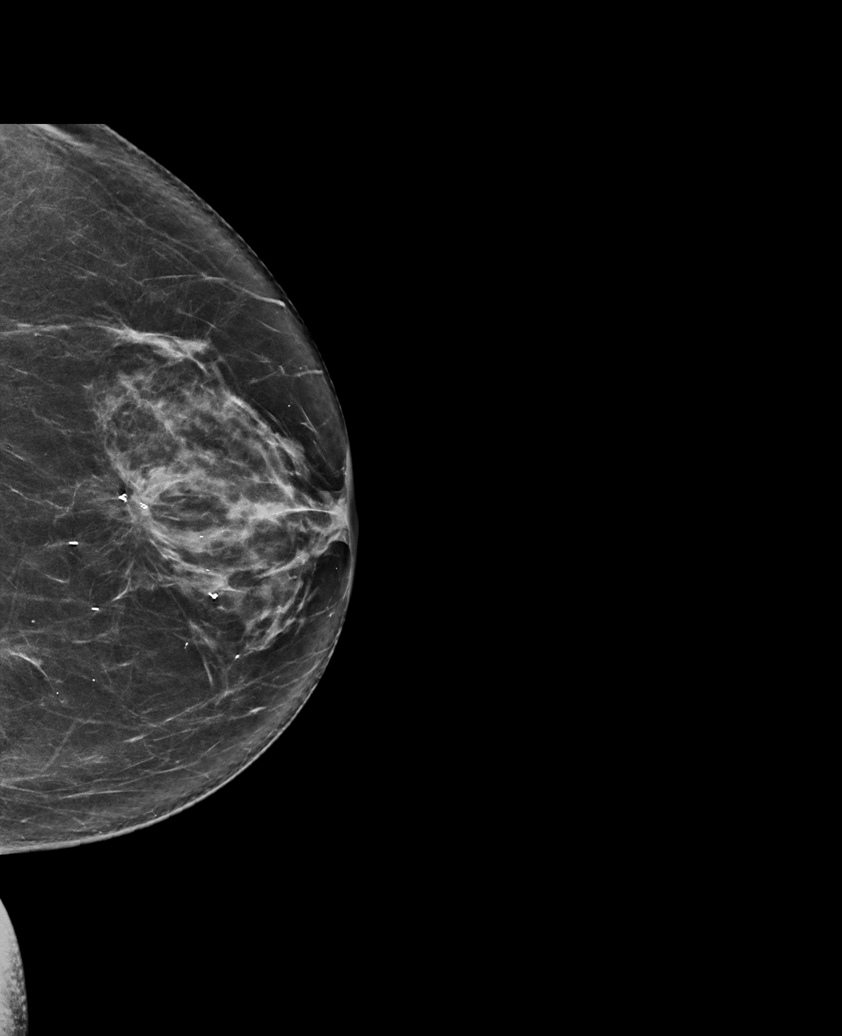

[R TAN tomo · tomo slice 29/58.0]
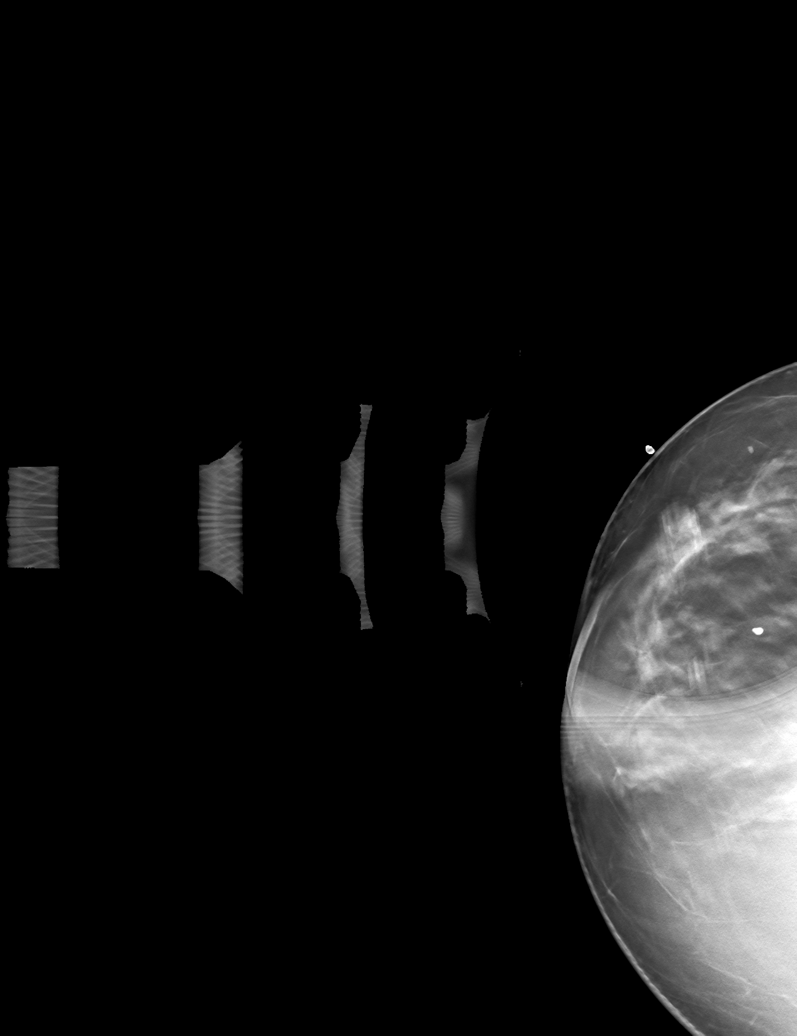

[R CC tomo · tomo slice 43/84.0]
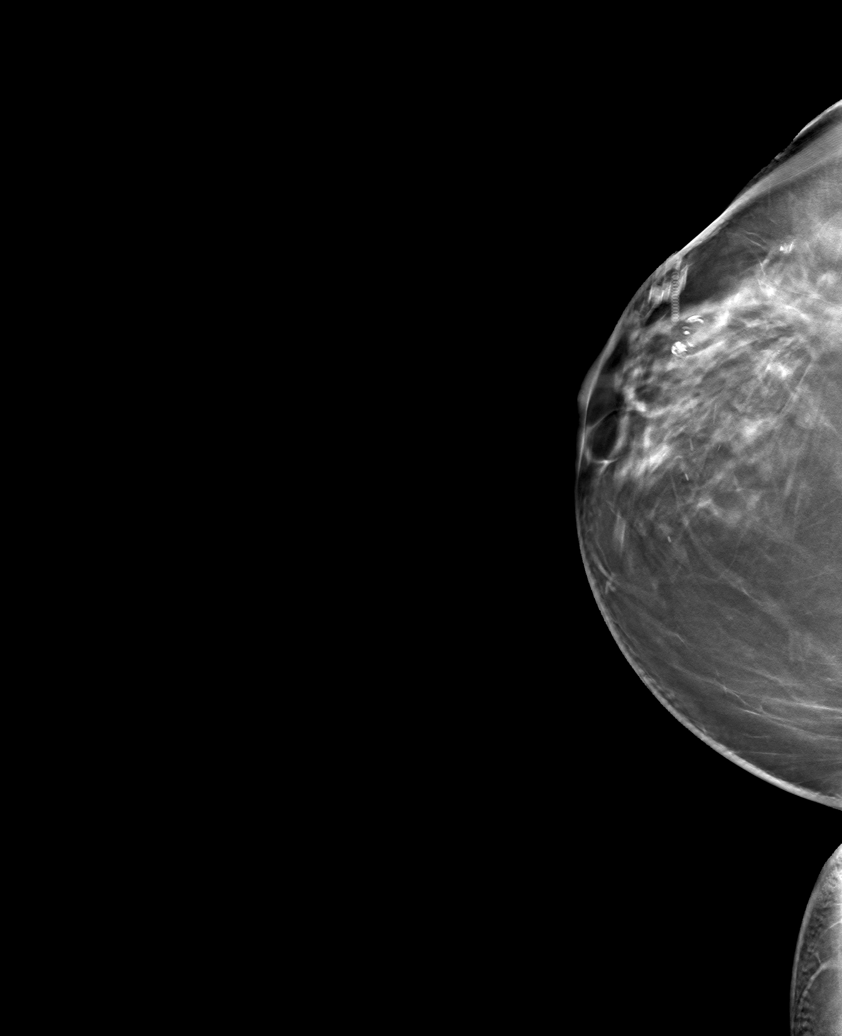

[L MLO tomo · tomo slice 45/89.0]
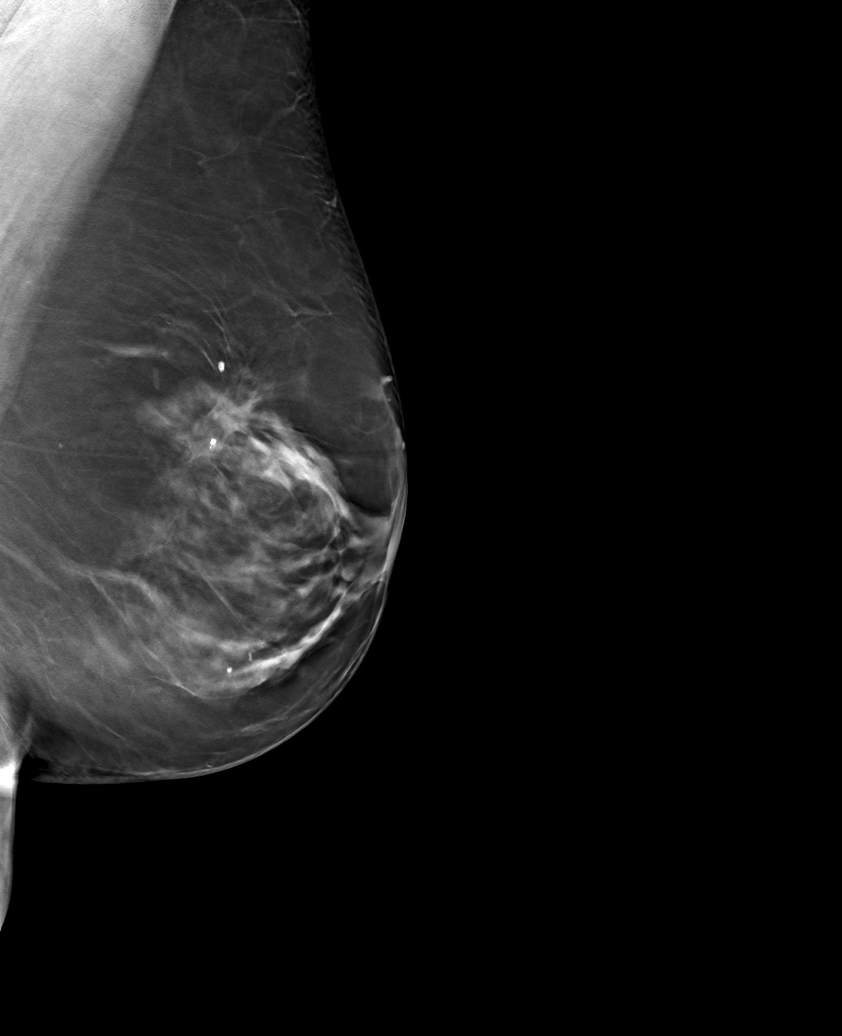

[6 of 18 positions shown; findings below may reference images not displayed]

ACR Breast Density Category c: The breast tissue is heterogeneously
dense, which may obscure small masses.
FINDINGS: Mammogram:

Right breast: There are of all vein postsurgical/post radiation
changes in the upper outer right breast at the lumpectomy site
including calcified fat necrosis. There is a skin BB marking the
palpable site of concern near the lumpectomy site. There is no new
suspicious finding.

Left breast: No suspicious mass, distortion, or microcalcifications
are identified to suggest presence of malignancy.

Mammographic images were processed with CAD.

On physical exam, I palpate soft fullness in the outer aspect of the
right breast at the palpable site of concern reported by the
patient. I palpate a small fixed mass which corresponds to the area
of fat necrosis seen mammographically but no additional discrete
mass.

Ultrasound:

Targeted ultrasound is performed at the palpable site of concern in
the right breast at 7-9 o'clock demonstrating no discrete cystic or
solid mass.
IMPRESSION: No mammographic or sonographic evidence of malignancy at the site of
concern reported by the patient in the lateral right breast. There
are evolving postsurgical changes in the right breast at the
lumpectomy site. No mammographic evidence of malignancy in the left
breast.

RECOMMENDATION:
1.  Screening mammogram in one year.(Code:DN-Y-H96)

2. Clinical follow-up as needed for the palpable area of concern in
the right breast.

I have discussed the findings and recommendations with the patient.
If applicable, a reminder letter will be sent to the patient
regarding the next appointment.

BI-RADS CATEGORY  2: Benign.

## 2023-02-02 IMAGING — DX DG CHEST 1V PORT
1 series · 1 of 1 positions shown · non-contrast
Comparison: 12/23/2019

CLINICAL DATA: Altered mental status

EXAM:
PORTABLE CHEST 1 VIEW

[chest ap]
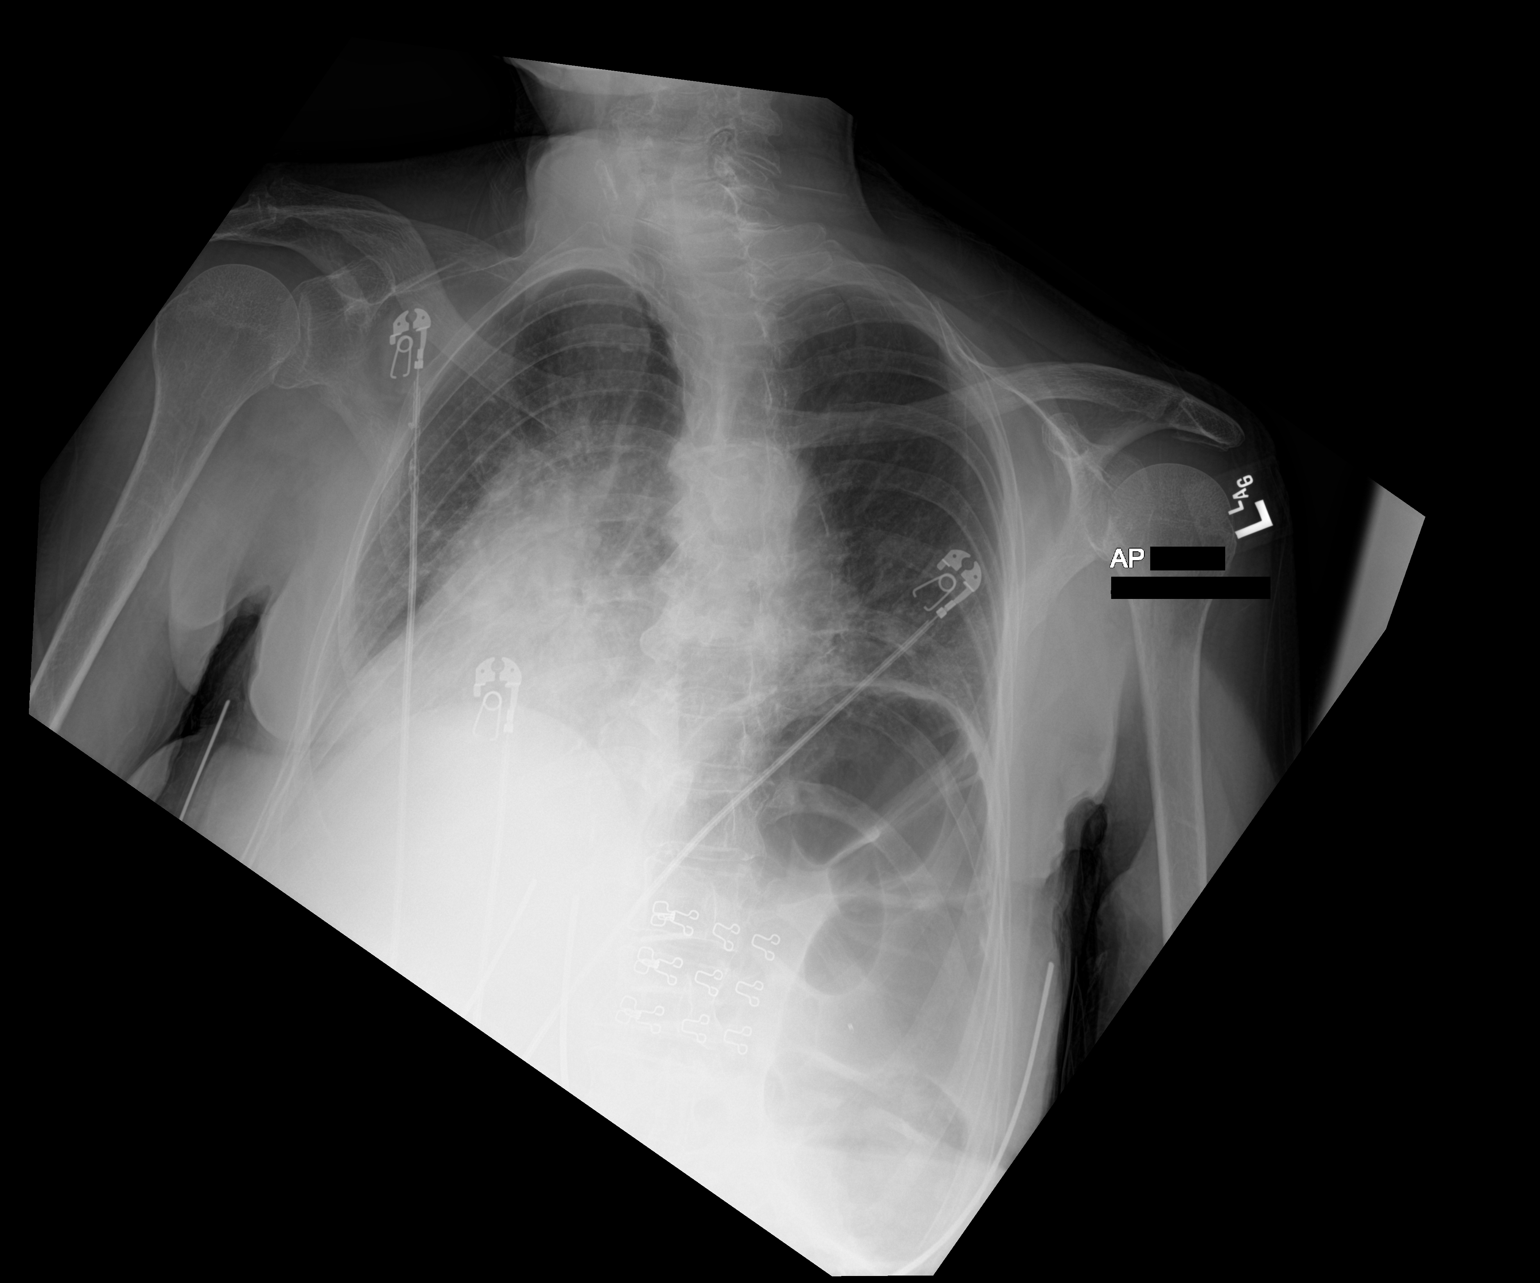

[1 of 1 positions shown; findings below may reference images not displayed]

FINDINGS: Elevation of left diaphragm with air distended bowel beneath the
diaphragm. Rightward displacement of cardiomediastinal contents.
Aortic atherosclerosis. Increased bibasilar airspace disease. No
pneumothorax
IMPRESSION: Elevation of the left diaphragm with air distended bowel beneath the
diaphragm. Increased bibasilar airspace disease, atelectasis versus
pneumonia.
